# Patient Record
Sex: Male | Born: 1955 | Race: Black or African American | Hispanic: No | Marital: Single | Smoking: Current every day smoker
Health system: Southern US, Community
[De-identification: ages and names within clinical notes are randomized; demographics above are authoritative.]

## PROBLEM LIST (undated history)

## (undated) DIAGNOSIS — K759 Inflammatory liver disease, unspecified: Secondary | ICD-10-CM

## (undated) DIAGNOSIS — I10 Essential (primary) hypertension: Secondary | ICD-10-CM

---

## 2017-05-15 ENCOUNTER — Emergency Department
Admission: EM | Admit: 2017-05-15 | Discharge: 2017-05-15 | Disposition: A | Discharge status: Home / Self Care | Payer: Medicare Other | Attending: Emergency Medicine | Admitting: Emergency Medicine

## 2017-05-15 DIAGNOSIS — Z79899 Other long term (current) drug therapy: Secondary | ICD-10-CM | POA: Insufficient documentation

## 2017-05-15 DIAGNOSIS — R454 Irritability and anger: Secondary | ICD-10-CM | POA: Insufficient documentation

## 2017-05-15 LAB — COMPREHENSIVE METABOLIC PANEL
ALT: 22 U/L (ref 17–63)
ANION GAP: 10 (ref 5–15)
AST: 23 U/L (ref 15–41)
Albumin: 3.7 g/dL (ref 3.5–5.0)
Alkaline Phosphatase: 80 U/L (ref 38–126)
BILIRUBIN TOTAL: 0.6 mg/dL (ref 0.3–1.2)
BUN: 13 mg/dL (ref 6–20)
CHLORIDE: 104 mmol/L (ref 101–111)
CO2: 25 mmol/L (ref 22–32)
Calcium: 9.7 mg/dL (ref 8.9–10.3)
Creatinine, Ser: 0.92 mg/dL (ref 0.61–1.24)
Glucose, Bld: 107 mg/dL — ABNORMAL HIGH (ref 65–99)
POTASSIUM: 4 mmol/L (ref 3.5–5.1)
Sodium: 139 mmol/L (ref 135–145)
TOTAL PROTEIN: 7.7 g/dL (ref 6.5–8.1)

## 2017-05-15 LAB — URINE DRUG SCREEN, QUALITATIVE (ARMC ONLY)
AMPHETAMINES, UR SCREEN: NOT DETECTED
BARBITURATES, UR SCREEN: NOT DETECTED
Benzodiazepine, Ur Scrn: NOT DETECTED
COCAINE METABOLITE, UR ~~LOC~~: NOT DETECTED
Cannabinoid 50 Ng, Ur ~~LOC~~: NOT DETECTED
MDMA (Ecstasy)Ur Screen: NOT DETECTED
METHADONE SCREEN, URINE: NOT DETECTED
OPIATE, UR SCREEN: NOT DETECTED
Phencyclidine (PCP) Ur S: NOT DETECTED
Tricyclic, Ur Screen: NOT DETECTED

## 2017-05-15 LAB — CBC
HEMATOCRIT: 39.4 % — AB (ref 40.0–52.0)
HEMOGLOBIN: 13.1 g/dL (ref 13.0–18.0)
MCH: 29.4 pg (ref 26.0–34.0)
MCHC: 33.2 g/dL (ref 32.0–36.0)
MCV: 88.5 fL (ref 80.0–100.0)
Platelets: 434 10*3/uL (ref 150–440)
RBC: 4.45 MIL/uL (ref 4.40–5.90)
RDW: 14.1 % (ref 11.5–14.5)
WBC: 10.3 10*3/uL (ref 3.8–10.6)

## 2017-05-15 LAB — ETHANOL

## 2017-05-15 MED ORDER — LORAZEPAM 1 MG PO TABS
1.0000 mg | ORAL_TABLET | Freq: Once | ORAL | Status: AC
Start: 2017-05-15 — End: 2017-05-15
  Administered 2017-05-15: 1 mg via ORAL
  Filled 2017-05-15: qty 1

## 2017-05-15 NOTE — ED Notes (Signed)
SOC  CALLED  INFORMED  BILL  RN

## 2017-05-15 NOTE — Discharge Instructions (Signed)
Today the psychiatrist saw you and recommended you go home.  Please follow up with your psychiatrist as needed.  It was a pleasure to take care of you today, and thank you for coming to our emergency department.  If you have any questions or concerns before leaving please ask the nurse to grab me and I'm more than happy to go through your aftercare instructions again.  If you were prescribed any opioid pain medication today such as Norco, Vicodin, Percocet, morphine, hydrocodone, or oxycodone please make sure you do not drive when you are taking this medication as it can alter your ability to drive safely.  If you have any concerns once you are home that you are not improving or are in fact getting worse before you can make it to your follow-up appointment, please do not hesitate to call 911 and come back for further evaluation.  Merrily BrittleNeil Moorea Boissonneault, MD  Results for orders placed or performed during the hospital encounter of 05/15/17  Comprehensive metabolic panel  Result Value Ref Range   Sodium 139 135 - 145 mmol/L   Potassium 4.0 3.5 - 5.1 mmol/L   Chloride 104 101 - 111 mmol/L   CO2 25 22 - 32 mmol/L   Glucose, Bld 107 (H) 65 - 99 mg/dL   BUN 13 6 - 20 mg/dL   Creatinine, Ser 1.610.92 0.61 - 1.24 mg/dL   Calcium 9.7 8.9 - 09.610.3 mg/dL   Total Protein 7.7 6.5 - 8.1 g/dL   Albumin 3.7 3.5 - 5.0 g/dL   AST 23 15 - 41 U/L   ALT 22 17 - 63 U/L   Alkaline Phosphatase 80 38 - 126 U/L   Total Bilirubin 0.6 0.3 - 1.2 mg/dL   GFR calc non Af Amer >60 >60 mL/min   GFR calc Af Amer >60 >60 mL/min   Anion gap 10 5 - 15  Ethanol  Result Value Ref Range   Alcohol, Ethyl (B) <10 <10 mg/dL  cbc  Result Value Ref Range   WBC 10.3 3.8 - 10.6 K/uL   RBC 4.45 4.40 - 5.90 MIL/uL   Hemoglobin 13.1 13.0 - 18.0 g/dL   HCT 04.539.4 (L) 40.940.0 - 81.152.0 %   MCV 88.5 80.0 - 100.0 fL   MCH 29.4 26.0 - 34.0 pg   MCHC 33.2 32.0 - 36.0 g/dL   RDW 91.414.1 78.211.5 - 95.614.5 %   Platelets 434 150 - 440 K/uL  Urine Drug Screen,  Qualitative  Result Value Ref Range   Tricyclic, Ur Screen NONE DETECTED NONE DETECTED   Amphetamines, Ur Screen NONE DETECTED NONE DETECTED   MDMA (Ecstasy)Ur Screen NONE DETECTED NONE DETECTED   Cocaine Metabolite,Ur [place] NONE DETECTED NONE DETECTED   Opiate, Ur Screen NONE DETECTED NONE DETECTED   Phencyclidine (PCP) Ur S NONE DETECTED NONE DETECTED   Cannabinoid 50 Ng, Ur [place] NONE DETECTED NONE DETECTED   Barbiturates, Ur Screen NONE DETECTED NONE DETECTED   Benzodiazepine, Ur Scrn NONE DETECTED NONE DETECTED   Methadone Scn, Ur NONE DETECTED NONE DETECTED

## 2017-05-15 NOTE — ED Notes (Signed)
SOC report given to Dr Maricela BoSprague att
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
## 2017-08-11 ENCOUNTER — Ambulatory Visit: Payer: Medicare Other | Admitting: Gastroenterology

## 2017-08-18 ENCOUNTER — Encounter (INDEPENDENT_AMBULATORY_CARE_PROVIDER_SITE_OTHER): Payer: Self-pay

## 2017-08-18 ENCOUNTER — Encounter: Payer: Self-pay | Admitting: Gastroenterology

## 2017-08-18 ENCOUNTER — Other Ambulatory Visit: Payer: Self-pay

## 2017-08-18 ENCOUNTER — Ambulatory Visit (INDEPENDENT_AMBULATORY_CARE_PROVIDER_SITE_OTHER): Payer: Medicare Other | Admitting: Gastroenterology

## 2017-08-18 VITALS — BP 138/90 | HR 67 | Temp 98.1°F | Ht 73.0 in | Wt 201.8 lb

## 2017-08-18 DIAGNOSIS — K5904 Chronic idiopathic constipation: Secondary | ICD-10-CM | POA: Diagnosis not present

## 2017-08-18 DIAGNOSIS — Z1211 Encounter for screening for malignant neoplasm of colon: Secondary | ICD-10-CM | POA: Diagnosis not present

## 2017-08-18 MED ORDER — POLYETHYLENE GLYCOL 3350 17 GM/SCOOP PO POWD: 75.0000 g | Freq: Two times a day (BID) | ORAL | 0 refills | Status: AC

## 2017-08-18 NOTE — Progress Notes (Signed)
Arlyss Repress, MD [address]  [address]  [place], [place] [address]  Main: [phone]  Fax: [phone]    Gastroenterology Consultation  Referring Provider:   Behavioral medicine department  Primary Care Physician:  Patient, No Pcp Per Primary Gastroenterologist:  Dr. Arlyss Repress Reason for Consultation:     Colon cancer screening        HPI:   Henry Black is a 62 y.o. black male referred to discuss about screening colonoscopy by [facility] department. He lives in a group home, accompanied by a caregiver today. Patient reports having constipation, does not take stool softener. He reports having had a colonoscopy several years ago and does not recall the findings. He otherwise denies abdominal pain, rectal bleeding, nausea, vomiting. He does smoke cigarettes. His CBC and CMP are unremarkable.  NSAIDs: None  Antiplts/Anticoagulants/Anti thrombotics: None  GI Procedures: Colonoscopy several years ago, report not available He denies any GI surgeries He denies family history of GI malignancy  No past medical history on file.    Prior to Admission medications   Medication Sig Start Date End Date Taking? Authorizing Provider  atorvastatin (LIPITOR) 20 MG tablet Take 20 mg by mouth daily.   Yes [provider]  benztropine (COGENTIN) 2 MG tablet Take 2 mg by mouth 2 (two) times daily.   Yes [provider]  Cholecalciferol 2000 units CAPS Take 2,000 Units by mouth daily.   Yes [provider]  clonazePAM (KLONOPIN) 1 MG tablet Take 1 mg by mouth 2 (two) times daily.   Yes [provider]  haloperidol decanoate (HALDOL DECANOATE) 100 MG/ML injection Inject 150 mg into the muscle every 28 (twenty-eight) days.   Yes [provider]    No family history on file.   Social History   Tobacco Use  . Smoking status: Current Every Day Smoker    Packs/day: 0.50    Years: 46.00    Pack years: 23.00    Types:  Cigarettes  . Smokeless tobacco: Never Used  Substance Use Topics  . Alcohol use: No    Frequency: Never  . Drug use: No    Allergies as of 08/18/2017 - Review Complete 08/18/2017  Allergen Reaction Noted  . Penicillins Rash 05/15/2017    Review of Systems:    All systems reviewed and negative except where noted in HPI.   Physical Exam:  BP 138/90   Pulse 67   Temp 98.1 F (36.7 C) (Oral)   Ht 6\' 1"  (1.854 m)   Wt 201 lb 12.8 oz (91.5 kg)   BMI 26.62 kg/m  No LMP for male patient.  General:   Alert,  Well-developed, well-nourished, pleasant and cooperative in NAD Head:  Normocephalic and atraumatic. Eyes:  Sclera clear, no icterus.   Conjunctiva pink. Ears:  Normal auditory acuity. Nose:  No deformity, discharge, or lesions. Mouth:  No deformity or lesions,oropharynx pink & moist. Neck:  Supple; no masses or thyromegaly. Lungs:  Respirations even and unlabored.  Clear throughout to auscultation.   No wheezes, crackles, or rhonchi. No acute distress. Heart:  Regular rate and rhythm; no murmurs, clicks, rubs, or gallops. Abdomen:  Normal bowel sounds. Soft, non-tender and non-distended without masses, hepatosplenomegaly or hernias noted.  No guarding or rebound tenderness.   Rectal: Not performed Msk:  Symmetrical without gross deformities. Good, equal movement & strength bilaterally. Pulses:  Normal pulses noted. Extremities:  No clubbing or edema.  No cyanosis. Neurologic:  Alert and oriented  x3;  grossly normal neurologically. Skin:  Intact without significant lesions or rashes. No jaundice. Lymph Nodes:  No significant cervical adenopathy. Psych:  Alert and cooperative. Normal mood and affect.  Imaging Studies: None  Assessment and Plan:   Henry Black is a 62 y.o. black male with chronic constipation, referred here in consultation for screening colonoscopy.  - Start MiraLAX 17 g twice a day - High-fiber diet - Schedule colonoscopy for colon cancer  screening  I have discussed alternative options, risks & benefits,  which include, but are not limited to, bleeding, infection, perforation,respiratory complication & drug reaction.  The patient agrees with this plan & written consent will be obtained.     Follow up based on the colonoscopy findings  Arlyss Repressohini R Tashina Credit, MD

## 2017-08-20 ENCOUNTER — Ambulatory Visit: Admission: RE | Admit: 2017-08-20 | Payer: Medicare Other | Source: Ambulatory Visit | Admitting: Gastroenterology

## 2017-08-20 ENCOUNTER — Encounter: Admission: RE | Payer: Self-pay | Source: Ambulatory Visit

## 2017-08-20 SURGERY — COLONOSCOPY WITH PROPOFOL
Anesthesia: General

## 2018-01-15 ENCOUNTER — Other Ambulatory Visit: Payer: Self-pay

## 2018-01-15 DIAGNOSIS — Z1211 Encounter for screening for malignant neoplasm of colon: Secondary | ICD-10-CM

## 2018-02-02 ENCOUNTER — Encounter: Payer: Self-pay | Admitting: Anesthesiology

## 2018-02-02 ENCOUNTER — Encounter
Admission: RE | Disposition: A | Discharge status: Home / Self Care | Payer: Self-pay | Source: Ambulatory Visit | Attending: Gastroenterology

## 2018-02-02 ENCOUNTER — Ambulatory Visit: Payer: Medicare Other | Admitting: Anesthesiology

## 2018-02-02 ENCOUNTER — Ambulatory Visit
Admission: RE | Admit: 2018-02-02 | Discharge: 2018-02-02 | Disposition: A | Discharge status: Home / Self Care | Payer: Medicare Other | Source: Ambulatory Visit | Attending: Gastroenterology | Admitting: Gastroenterology

## 2018-02-02 DIAGNOSIS — F17210 Nicotine dependence, cigarettes, uncomplicated: Secondary | ICD-10-CM | POA: Insufficient documentation

## 2018-02-02 DIAGNOSIS — Z79899 Other long term (current) drug therapy: Secondary | ICD-10-CM | POA: Insufficient documentation

## 2018-02-02 DIAGNOSIS — Z880 Allergy status to penicillin: Secondary | ICD-10-CM | POA: Insufficient documentation

## 2018-02-02 DIAGNOSIS — Z1211 Encounter for screening for malignant neoplasm of colon: Secondary | ICD-10-CM | POA: Insufficient documentation

## 2018-02-02 SURGERY — COLONOSCOPY WITH PROPOFOL
Anesthesia: General

## 2018-02-02 MED ORDER — LIDOCAINE HCL (PF) 2 % IJ SOLN
INTRAMUSCULAR | Status: AC
  Filled 2018-02-02: qty 10

## 2018-02-02 MED ORDER — PROPOFOL 500 MG/50ML IV EMUL
INTRAVENOUS | Status: AC
  Filled 2018-02-02: qty 50

## 2018-02-02 MED ORDER — PROPOFOL 500 MG/50ML IV EMUL
INTRAVENOUS | Status: DC | PRN
  Administered 2018-02-02: 120 ug/kg/min via INTRAVENOUS

## 2018-02-02 MED ORDER — PROPOFOL 10 MG/ML IV BOLUS
INTRAVENOUS | Status: AC
  Filled 2018-02-02: qty 20

## 2018-02-02 MED ORDER — SODIUM CHLORIDE 0.9 % IV SOLN
INTRAVENOUS | Status: DC
  Administered 2018-02-02 (×2): via INTRAVENOUS

## 2018-02-02 MED ORDER — PROPOFOL 10 MG/ML IV BOLUS
INTRAVENOUS | Status: DC | PRN
  Administered 2018-02-02: 50 mg via INTRAVENOUS

## 2018-02-02 MED ORDER — LIDOCAINE HCL (CARDIAC) PF 100 MG/5ML IV SOSY
PREFILLED_SYRINGE | INTRAVENOUS | Status: DC | PRN
  Administered 2018-02-02: 60 mg via INTRAVENOUS

## 2018-02-02 NOTE — Op Note (Signed)
[facility] Gastroenterology Patient Name: Henry Black Procedure Date: 02/02/2018 3:28 PM MRN: [id number] Account #: [id number] Date of Birth: 11/03/1955 Admit Type: Outpatient Age: 62 Room: [facility] ENDO ROOM 4 Gender: Male Note Status: Finalized Procedure:            Colonoscopy Indications:          Screening for colorectal malignant neoplasm, This is                        the patient's first colonoscopy Providers:            Lin Landsman MD, MD Referring MD:         No Local Md, MD (Referring MD) Medicines:            Monitored Anesthesia Care Complications:        No immediate complications. Estimated blood loss: None. Procedure:            Pre-Anesthesia Assessment:                       - Prior to the procedure, a History and Physical was                        performed, and patient medications and allergies were                        reviewed. The patient is competent. The risks and                        benefits of the procedure and the sedation options and                        risks were discussed with the patient. All questions                        were answered and informed consent was obtained.                        Patient identification and proposed procedure were                        verified by the physician, the nurse, the                        anesthesiologist, the anesthetist and the technician in                        the pre-procedure area in the procedure room in the                        endoscopy suite. Mental Status Examination: alert and                        oriented. Airway Examination: normal oropharyngeal                        airway and neck mobility. Respiratory Examination:                        clear to auscultation. CV Examination: normal.  Prophylactic Antibiotics: The patient does not require                        prophylactic antibiotics. Prior Anticoagulants: The                         patient has taken no previous anticoagulant or                        antiplatelet agents. ASA Grade Assessment: II - A                        patient with mild systemic disease. After reviewing the                        risks and benefits, the patient was deemed in                        satisfactory condition to undergo the procedure. The                        anesthesia plan was to use monitored anesthesia care                        (MAC). Immediately prior to administration of                        medications, the patient was re-assessed for adequacy                        to receive sedatives. The heart rate, respiratory rate,                        oxygen saturations, blood pressure, adequacy of                        pulmonary ventilation, and response to care were                        monitored throughout the procedure. The physical status                        of the patient was re-assessed after the procedure.                       After obtaining informed consent, the colonoscope was                        passed under direct vision. Throughout the procedure,                        the patient's blood pressure, pulse, and oxygen                        saturations were monitored continuously. The                        Colonoscope was introduced through the anus and  advanced to the the cecum, identified by appendiceal                        orifice and ileocecal valve. The colonoscopy was                        performed with moderate difficulty due to significant                        looping and a tortuous colon. Successful completion of                        the procedure was aided by applying abdominal pressure.                        The patient tolerated the procedure well. The quality                        of the bowel preparation was evaluated using the BBPS                        [facility] Bowel Preparation Scale) with scores of: Right                         Colon = 3, Transverse Colon = 3 and Left Colon = 3                        (entire mucosa seen well with no residual staining,                        small fragments of stool or opaque liquid). The total                        BBPS score equals 9. Findings:      The perianal and digital rectal examinations were normal. Pertinent       negatives include normal sphincter tone and no palpable rectal lesions.      The colon (entire examined portion) appeared normal.      The retroflexed view of the distal rectum and anal verge was normal and       showed no anal or rectal abnormalities. Impression:           - The entire examined colon is normal.                       - The distal rectum and anal verge are normal on                        retroflexion view.                       - No specimens collected. Recommendation:       - Discharge patient to home (with escort).                       - Resume previous diet today.                       - Continue present medications.                       -  Repeat colonoscopy in 10 years for surveillance. Procedure Code(s):    --- Professional ---                       H8718, Colorectal cancer screening; colonoscopy on                        individual not meeting criteria for high risk Diagnosis Code(s):    --- Professional ---                       Z12.11, Encounter for screening for malignant neoplasm                        of colon CPT copyright 2017 American Medical Association. All rights reserved. The codes documented in this report are preliminary and upon coder review may  be revised to meet current compliance requirements. Dr. Ulyess Mort Lin Landsman MD, MD 02/02/2018 3:53:52 PM This report has been signed electronically. Number of Addenda: 0 Note Initiated On: 02/02/2018 3:28 PM Scope Withdrawal Time: 0 hours 6 minutes 16 seconds  Total Procedure Duration: 0 hours 16 minutes 20 seconds       [facility]

## 2018-02-02 NOTE — Transfer of Care (Signed)
Immediate Anesthesia Transfer of Care Note  Patient: Henry Black Near  Procedure(s) Performed: COLONOSCOPY WITH PROPOFOL (N/A )  Patient Location: PACU  Anesthesia Type:General  Level of Consciousness: sedated  Airway & Oxygen Therapy: Patient Spontanous Breathing and Patient connected to nasal cannula oxygen  Post-op Assessment: Report given to RN and Post -op Vital signs reviewed and stable  Post vital signs: Reviewed and stable  Last Vitals:  Vitals Value Taken Time  BP 93/58 02/02/2018  3:56 PM  Temp    Pulse 61 02/02/2018  3:56 PM  Resp 21 02/02/2018  3:56 PM  SpO2 100 % 02/02/2018  3:56 PM  Vitals shown include unvalidated device data.  Last Pain:  Vitals:   02/02/18 1347  TempSrc: Tympanic  PainSc: 0-No pain         Complications: No apparent anesthesia complications

## 2018-02-02 NOTE — H&P (Signed)
Arlyss Repress, MD [address]  [address]  [place], [place] [address]  Main: [phone]  Fax: [phone] Pager: [phone]  Primary Care Physician:  Patient, No Pcp Per Primary Gastroenterologist:  Dr. Arlyss Repress  Pre-Procedure History & Physical: HPI:  Henry Black is a 62 y.o. male is here for an colonoscopy.   History reviewed. No pertinent past medical history.  History reviewed. No pertinent surgical history.  Prior to Admission medications   Medication Sig Start Date End Date Taking? Authorizing Provider  atorvastatin (LIPITOR) 20 MG tablet Take 20 mg by mouth daily.   Yes [provider]  benztropine (COGENTIN) 2 MG tablet Take 2 mg by mouth 2 (two) times daily.   Yes [provider]  clonazePAM (KLONOPIN) 1 MG tablet Take 1 mg by mouth 2 (two) times daily.   Yes [provider]  haloperidol decanoate (HALDOL DECANOATE) 100 MG/ML injection Inject 150 mg into the muscle every 28 (twenty-eight) days.   Yes [provider]  Cholecalciferol 2000 units CAPS Take 2,000 Units by mouth daily.    [provider]    Allergies as of 01/15/2018 - Review Complete 08/18/2017  Allergen Reaction Noted  . Penicillins Rash 05/15/2017    History reviewed. No pertinent family history.  Social History   Socioeconomic History  . Marital status: Single    Spouse name: Not on file  . Number of children: Not on file  . Years of education: Not on file  . Highest education level: Not on file  Occupational History  . Not on file  Social Needs  . Financial resource strain: Not on file  . Food insecurity:    Worry: Not on file    Inability: Not on file  . Transportation needs:    Medical: Not on file    Non-medical: Not on file  Tobacco Use  . Smoking status: Current Every Day Smoker    Packs/day: 0.50    Years: 46.00    Pack years: 23.00    Types: Cigarettes  . Smokeless tobacco: Never Used  Substance and Sexual  Activity  . Alcohol use: No    Frequency: Never  . Drug use: No  . Sexual activity: Not on file  Lifestyle  . Physical activity:    Days per week: Not on file    Minutes per session: Not on file  . Stress: Not on file  Relationships  . Social connections:    Talks on phone: Not on file    Gets together: Not on file    Attends religious service: Not on file    Active member of club or organization: Not on file    Attends meetings of clubs or organizations: Not on file    Relationship status: Not on file  . Intimate partner violence:    Fear of current or ex partner: Not on file    Emotionally abused: Not on file    Physically abused: Not on file    Forced sexual activity: Not on file  Other Topics Concern  . Not on file  Social History Narrative  . Not on file    Review of Systems: See HPI, otherwise negative ROS  Physical Exam: BP (!) 137/95   Pulse 86   Temp 97.9 F (36.6 C) (Tympanic)   Resp 20   Ht 6\' 1"  (1.854 m)   Wt 192 lb (87.1 kg)   SpO2 100%   BMI 25.33 kg/m  General:   Alert,  pleasant and cooperative in NAD Head:  Normocephalic and atraumatic. Neck:  Supple; no masses or thyromegaly. Lungs:  Clear throughout to auscultation.    Heart:  Regular rate and rhythm. Abdomen:  Soft, nontender and nondistended. Normal bowel sounds, without guarding, and without rebound.   Neurologic:  Alert and  oriented x4;  grossly normal neurologically.  Impression/Plan: Baker JanusDonald Feild is here for an colonoscopy to be performed for colon cancer screening  Risks, benefits, limitations, and alternatives regarding  colonoscopy have been reviewed with the patient.  Questions have been answered.  All parties agreeable.   Lannette Donathohini Shagun Wordell, MD  02/02/2018, 3:20 PM

## 2018-02-02 NOTE — Anesthesia Preprocedure Evaluation (Signed)
Anesthesia Evaluation  Patient identified by MRN, date of birth, ID band Patient awake    Reviewed: Allergy & Precautions, NPO status , Patient's Chart, lab work & pertinent test results, reviewed documented beta blocker date and time   Airway Mallampati: II  TM Distance: >3 FB     Dental  (+) Upper Dentures, Lower Dentures   Pulmonary Current Smoker,           Cardiovascular      Neuro/Psych    GI/Hepatic   Endo/Other    Renal/GU      Musculoskeletal   Abdominal   Peds  Hematology   Anesthesia Other Findings   Reproductive/Obstetrics                             Anesthesia Physical Anesthesia Plan  ASA: II  Anesthesia Plan: General   Post-op Pain Management:    Induction: Intravenous  PONV Risk Score and Plan:   Airway Management Planned:   Additional Equipment:   Intra-op Plan:   Post-operative Plan:   Informed Consent: I have reviewed the patients History and Physical, chart, labs and discussed the procedure including the risks, benefits and alternatives for the proposed anesthesia with the patient or authorized representative who has indicated his/her understanding and acceptance.     Plan Discussed with: CRNA  Anesthesia Plan Comments:         Anesthesia Quick Evaluation  

## 2018-02-02 NOTE — Anesthesia Post-op Follow-up Note (Signed)
Anesthesia QCDR form completed.        

## 2018-02-02 NOTE — Anesthesia Postprocedure Evaluation (Signed)
Anesthesia Post Note  Patient: Henry Black  Procedure(s) Performed: COLONOSCOPY WITH PROPOFOL (N/A )  Patient location during evaluation: Endoscopy Anesthesia Type: General Level of consciousness: awake and alert Pain management: pain level controlled Vital Signs Assessment: post-procedure vital signs reviewed and stable Respiratory status: spontaneous breathing, nonlabored ventilation, respiratory function stable and patient connected to nasal cannula oxygen Cardiovascular status: blood pressure returned to baseline and stable Postop Assessment: no apparent nausea or vomiting Anesthetic complications: no     Last Vitals:  Vitals:   02/02/18 1347 02/02/18 1557  BP: (!) 137/95   Pulse: 86   Resp: 20   Temp: 36.6 C (!) 36.1 C  SpO2: 100%     Last Pain:  Vitals:   02/02/18 1557  TempSrc: Tympanic  PainSc:                  Sabeen Piechocki S

## 2018-06-02 ENCOUNTER — Encounter: Payer: Self-pay | Admitting: Emergency Medicine

## 2018-06-02 ENCOUNTER — Other Ambulatory Visit: Payer: Self-pay

## 2018-06-02 ENCOUNTER — Emergency Department
Admission: EM | Admit: 2018-06-02 | Discharge: 2018-06-02 | Disposition: A | Discharge status: Home / Self Care | Payer: Medicare Other | Attending: Emergency Medicine | Admitting: Emergency Medicine

## 2018-06-02 DIAGNOSIS — Z7689 Persons encountering health services in other specified circumstances: Secondary | ICD-10-CM

## 2018-06-02 DIAGNOSIS — Z79899 Other long term (current) drug therapy: Secondary | ICD-10-CM | POA: Insufficient documentation

## 2018-06-02 DIAGNOSIS — F17200 Nicotine dependence, unspecified, uncomplicated: Secondary | ICD-10-CM | POA: Diagnosis not present

## 2018-06-02 DIAGNOSIS — Z008 Encounter for other general examination: Secondary | ICD-10-CM | POA: Insufficient documentation

## 2018-06-02 MED ORDER — HALOPERIDOL DECANOATE 100 MG/ML IM SOLN
150.0000 mg | Freq: Once | INTRAMUSCULAR | Status: AC
  Administered 2018-06-02: 150 mg via INTRAMUSCULAR

## 2018-06-02 NOTE — ED Provider Notes (Signed)
[facility] Emergency Department Provider Note   ____________________________________________   First MD Initiated Contact with Patient 06/02/18 1425     (approximate)  I have reviewed the triage vital signs and the nursing notes.   HISTORY  Chief Complaint Haldol Injection    HPI Henry Black is a 62 y.o. male patient arrives with caretaker to have a Haldol injection.  Patient state injection normally given by [facility] clinic but they were unable to accommodate him today.  History reviewed. No pertinent past medical history.  Patient Active Problem List   Diagnosis Date Noted  . Colon cancer screening   . Chronic idiopathic constipation 08/18/2017    History reviewed. No pertinent surgical history.  Prior to Admission medications   Medication Sig Start Date End Date Taking? Authorizing Provider  atorvastatin (LIPITOR) 20 MG tablet Take 20 mg by mouth daily.    [provider]  benztropine (COGENTIN) 2 MG tablet Take 2 mg by mouth 2 (two) times daily.    [provider]  Cholecalciferol 2000 units CAPS Take 2,000 Units by mouth daily.    [provider]  clonazePAM (KLONOPIN) 1 MG tablet Take 1 mg by mouth 2 (two) times daily.    [provider]  haloperidol decanoate (HALDOL DECANOATE) 100 MG/ML injection Inject 150 mg into the muscle every 28 (twenty-eight) days.    [provider]    Allergies Penicillins  No family history on file.  Social History Social History   Tobacco Use  . Smoking status: Current Every Day Smoker    Packs/day: 0.50    Years: 46.00    Pack years: 23.00    Types: Cigarettes  . Smokeless tobacco: Never Used  Substance Use Topics  . Alcohol use: No    Frequency: Never  . Drug use: No    Review of Systems Constitutional: No fever/chills Eyes: No visual changes. ENT: No sore throat. Cardiovascular: Denies chest pain. Respiratory: Denies shortness of  breath. Skin: Negative for rash. Neurological: Negative for headaches, focal weakness or numbness. Psychiatric:Schizophrenia Allergic/Immunilogical: Penicillin ____________________________________________   PHYSICAL EXAM:  VITAL SIGNS: ED Triage Vitals  Enc Vitals Group     BP 06/02/18 1416 (!) 150/96     Pulse --      Resp 06/02/18 1416 18     Temp 06/02/18 1416 98.4 F (36.9 C)     Temp Source 06/02/18 1416 Oral     SpO2 06/02/18 1416 96 %     Weight 06/02/18 1417 195 lb (88.5 kg)     Height 06/02/18 1417 6\' 1"  (1.854 m)     Head Circumference --      Peak Flow --      Pain Score 06/02/18 1417 0     Pain Loc --      Pain Edu? --      Excl. in GC? --     Constitutional: Alert and oriented. Well appearing and in no acute distress. Cardiovascular: Normal rate, regular rhythm. Grossly normal heart sounds.  Good peripheral circulation. Respiratory: Normal respiratory effort.  No retractions. Lungs CTAB. Neurologic:  Normal speech and language. No gross focal neurologic deficits are appreciated. No gait instability. Skin:  Skin is warm, dry and intact. No rash noted. Psychiatric: Mood and affect are normal. Speech and behavior are normal.  ____________________________________________   LABS (all labs ordered are listed, but only abnormal results are displayed)  Labs Reviewed - No data to display ____________________________________________  EKG   ____________________________________________  RADIOLOGY  ED MD interpretation:    Official radiology report(s): No results found.  ____________________________________________   PROCEDURES  Procedure(s) performed: None  Procedures  Critical Care performed: No  ____________________________________________   INITIAL IMPRESSION / ASSESSMENT AND PLAN / ED COURSE  As part of my medical decision making, I reviewed the following data within the electronic MEDICAL RECORD NUMBER    Encounter for medication  administration.  Patient given IM injection 100 mg Haldol.  Patient advised follow-up PCP for continued care.      ____________________________________________   FINAL CLINICAL IMPRESSION(S) / ED DIAGNOSES  Final diagnoses:  Encounter for medication administration     ED Discharge Orders    None       Note:  This document was prepared using Dragon voice recognition software and may include unintentional dictation errors.    Joni Reining, PA-C 06/02/18 1452    Rockne Menghini, MD 06/02/18 [phone]

## 2018-06-02 NOTE — ED Triage Notes (Signed)
Pt arrived with caretaker due to inability to receive pt's haldol shot. Pt denies any pain or other complaints.

## 2018-09-16 ENCOUNTER — Emergency Department
Admission: EM | Admit: 2018-09-16 | Discharge: 2018-09-17 | Disposition: A | Discharge status: Home / Self Care | Payer: Medicare Other | Attending: Emergency Medicine | Admitting: Emergency Medicine

## 2018-09-16 ENCOUNTER — Other Ambulatory Visit: Payer: Self-pay

## 2018-09-16 ENCOUNTER — Encounter: Payer: Self-pay | Admitting: Emergency Medicine

## 2018-09-16 DIAGNOSIS — I10 Essential (primary) hypertension: Secondary | ICD-10-CM | POA: Diagnosis not present

## 2018-09-16 DIAGNOSIS — F17210 Nicotine dependence, cigarettes, uncomplicated: Secondary | ICD-10-CM | POA: Insufficient documentation

## 2018-09-16 DIAGNOSIS — Z79899 Other long term (current) drug therapy: Secondary | ICD-10-CM | POA: Insufficient documentation

## 2018-09-16 DIAGNOSIS — F329 Major depressive disorder, single episode, unspecified: Secondary | ICD-10-CM | POA: Insufficient documentation

## 2018-09-16 DIAGNOSIS — F99 Mental disorder, not otherwise specified: Secondary | ICD-10-CM | POA: Diagnosis present

## 2018-09-16 DIAGNOSIS — R4689 Other symptoms and signs involving appearance and behavior: Secondary | ICD-10-CM | POA: Diagnosis present

## 2018-09-16 DIAGNOSIS — R456 Violent behavior: Secondary | ICD-10-CM | POA: Insufficient documentation

## 2018-09-16 HISTORY — DX: Essential (primary) hypertension: I10

## 2018-09-16 LAB — CBC
HCT: 43.9 % (ref 39.0–52.0)
Hemoglobin: 14.4 g/dL (ref 13.0–17.0)
MCH: 29.4 pg (ref 26.0–34.0)
MCHC: 32.8 g/dL (ref 30.0–36.0)
MCV: 89.8 fL (ref 80.0–100.0)
NRBC: 0 % (ref 0.0–0.2)
Platelets: 219 10*3/uL (ref 150–400)
RBC: 4.89 MIL/uL (ref 4.22–5.81)
RDW: 13.2 % (ref 11.5–15.5)
WBC: 10.2 10*3/uL (ref 4.0–10.5)

## 2018-09-16 LAB — URINE DRUG SCREEN, QUALITATIVE (ARMC ONLY)
AMPHETAMINES, UR SCREEN: NOT DETECTED
BENZODIAZEPINE, UR SCRN: NOT DETECTED
Barbiturates, Ur Screen: NOT DETECTED
Cannabinoid 50 Ng, Ur ~~LOC~~: NOT DETECTED
Cocaine Metabolite,Ur ~~LOC~~: NOT DETECTED
MDMA (ECSTASY) UR SCREEN: NOT DETECTED
Methadone Scn, Ur: NOT DETECTED
Opiate, Ur Screen: NOT DETECTED
Phencyclidine (PCP) Ur S: NOT DETECTED
TRICYCLIC, UR SCREEN: NOT DETECTED

## 2018-09-16 LAB — COMPREHENSIVE METABOLIC PANEL
ALBUMIN: 4.5 g/dL (ref 3.5–5.0)
ALT: 39 U/L (ref 0–44)
ANION GAP: 6 (ref 5–15)
AST: 48 U/L — AB (ref 15–41)
Alkaline Phosphatase: 62 U/L (ref 38–126)
BILIRUBIN TOTAL: 0.9 mg/dL (ref 0.3–1.2)
BUN: 13 mg/dL (ref 8–23)
CHLORIDE: 104 mmol/L (ref 98–111)
CO2: 27 mmol/L (ref 22–32)
Calcium: 9 mg/dL (ref 8.9–10.3)
Creatinine, Ser: 1 mg/dL (ref 0.61–1.24)
GFR calc non Af Amer: >60 mL/min (ref >60)
Glucose, Bld: 89 mg/dL (ref 70–99)
POTASSIUM: 4.1 mmol/L (ref 3.5–5.1)
Sodium: 137 mmol/L (ref 135–145)
Total Protein: 7.6 g/dL (ref 6.5–8.1)

## 2018-09-16 LAB — ETHANOL: Alcohol, Ethyl (B): <10 mg/dL (ref <10)
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
## 2018-09-17 DIAGNOSIS — R4689 Other symptoms and signs involving appearance and behavior: Secondary | ICD-10-CM | POA: Diagnosis present

## 2018-09-17 LAB — ACETAMINOPHEN LEVEL

## 2018-09-17 LAB — SALICYLATE LEVEL: Salicylate Lvl: <7 mg/dL (ref 2.8–30.0)

## 2018-09-17 NOTE — Discharge Instructions (Signed)

## 2018-09-17 NOTE — Progress Notes (Signed)
LCSW called [facility] [phone] she has made arrangements for patient to be picked up by Ms Fonnie Mu and he will be residing at a new group home. LCSW consulted with ED RN Amy about this outcome.  No further SW needs   [employer] LCSW [phone]
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
## 2018-09-17 NOTE — ED Notes (Signed)
Pt dressed in burgundy scrubs by myself, Janeece Riggers, and [employer] officer that accompanied him.  Pts belongings, which include a coat, ball cap, sneakers, socks, sweatpants, underwear, sweatshirt, tshirt and a watch with a bright orange band (pt stated it did not work).  Items bagged, labelled, and placed at nurses station to be locked up.  Pt ambulated to room 23 without incident.

## 2018-09-17 NOTE — ED Notes (Signed)
Pt given breakfast tray

## 2018-09-17 NOTE — ED Notes (Signed)
Hourly rounding reveals patient in room. No complaints, stable, in no acute distress. Q15 minute rounds and monitoring via Rover and Officer to continue.   

## 2018-09-17 NOTE — ED Notes (Addendum)
Pt. Transferred to [facility] from ED to room after screening for contraband. Report to include Situation, Background, Assessment and Recommendations from Andrea RN. Pt. Oriented to unit including Q15 minute rounds as well as the security cameras for their protection. Patient is alert and oriented, warm and dry in no acute distress. Patient denies SI, HI, and AVH. Pt. Encouraged to let me know if needs arise.  

## 2018-09-17 NOTE — ED Notes (Signed)
Attempted to call patient group home and no answer.

## 2018-09-17 NOTE — ED Notes (Signed)
Pt discharged to group home in care of [facility].  VS stable. Pt denies SI/HI. Denies pain.  Discharge instructions reviewed with caregiver. Pt signed for discharge paperwork.  All belongings returned to patient.

## 2018-09-17 NOTE — ED Provider Notes (Signed)
[facility] Emergency Department Provider Note  ____________________________________________   First MD Initiated Contact with Patient 09/17/18 0007     (approximate)  I have reviewed the triage vital signs and the nursing notes.   HISTORY  Chief Complaint Medical Clearance   Level 5 caveat:  history/ROS may be limited by chronic mental illness.   HPI Baker JanusDonald Paragas is a 63 y.o. male who lives at a group home and has an unspecified mental illness and [place] [facility] [place] [employer] [place] psychiatric evaluation.  Reportedly he was buying lottery tickets at a gas station when the officers arrived because the group home called them and asked them to pick him up.  They said that his behavior has been different since he recently had a change in his Haldol.  Reportedly they are saying that they do not feel safe around him but there is been no indication that he has been acting violently.  The patient is calm and cooperative.  He says he does not understand why they called the police.  He says that he has no thoughts about harming anyone else or himself, he is not having any auditory or visual hallucinations, and he denies any medical issues.  He denies fever/chills, chest pain, shortness of breath, nausea, vomiting, and abdominal pain.  He is ambulating without any difficulty.  He is not able to specify his symptoms because he claims he does not have any symptoms.  Past Medical History:  Diagnosis Date  . Hypertension     Patient Active Problem List   Diagnosis Date Noted  . Aggressive behavior 09/17/2018  . Colon cancer screening   . Chronic idiopathic constipation 08/18/2017    History reviewed. No pertinent surgical history.  Prior to Admission medications   Medication Sig Start Date End Date Taking? Authorizing Provider  atorvastatin (LIPITOR) 20 MG tablet Take 20 mg by mouth daily.    [provider]  benztropine  (COGENTIN) 2 MG tablet Take 2 mg by mouth 2 (two) times daily.    [provider]  Cholecalciferol 2000 units CAPS Take 2,000 Units by mouth daily.    [provider]  clonazePAM (KLONOPIN) 1 MG tablet Take 1 mg by mouth 2 (two) times daily.    [provider]  haloperidol decanoate (HALDOL DECANOATE) 100 MG/ML injection Inject 150 mg into the muscle every 28 (twenty-eight) days.    [provider]    Allergies Penicillins  No family history on file.  Social History Social History   Tobacco Use  . Smoking status: Current Every Day Smoker    Packs/day: 0.50    Years: 46.00    Pack years: 23.00    Types: Cigarettes  . Smokeless tobacco: Never Used  Substance Use Topics  . Alcohol use: Not Currently    Frequency: Never  . Drug use: Not Currently    Review of Systems Level 5 caveat:  history/ROS may be limited by chronic mental illness.  Constitutional: No fever/chills Eyes: No visual changes. ENT: No sore throat. Cardiovascular: Denies chest pain. Respiratory: Denies shortness of breath. Gastrointestinal: No abdominal pain.  No nausea, no vomiting.  No diarrhea.  No constipation. Genitourinary: Negative for dysuria. Musculoskeletal: Negative for neck pain.  Negative for back pain. Integumentary: Negative for rash. Neurological: Negative for headaches, focal weakness or numbness. Psychiatric:Denies SI/HI.  Denies hallucinations. ____________________________________________   PHYSICAL EXAM:  VITAL SIGNS: ED Triage Vitals  Enc Vitals Group     BP 09/16/18  2310 (!) 165/91     Pulse Rate 09/16/18 2310 83     Resp 09/16/18 2310 18     Temp 09/16/18 2310 98.8 F (37.1 C)     Temp Source 09/16/18 2310 Oral     SpO2 09/16/18 2310 100 %     Weight 09/16/18 2311 92.5 kg (204 lb)     Height 09/16/18 2311 1.854 m (6\' 1" )     Head Circumference --      Peak Flow --      Pain Score 09/16/18 2310 0     Pain Loc --      Pain Edu? --       Excl. in GC? --     Constitutional: Alert and oriented. Well appearing and in no acute distress. Eyes: Conjunctivae are normal.  Head: Atraumatic. Nose: No congestion/rhinnorhea. Mouth/Throat: Mucous membranes are moist. Neck: No stridor.  No meningeal signs.   Cardiovascular: Normal rate, regular rhythm. Good peripheral circulation. Grossly normal heart sounds. Respiratory: Normal respiratory effort.  No retractions. Lungs CTAB. Gastrointestinal: Soft and nontender. No distention.  Musculoskeletal: No lower extremity tenderness nor edema. No gross deformities of extremities. Neurologic: Speech is a little bit difficult to understand at baseline but appears to be within normal limits.. No gross focal neurologic deficits are appreciated.  Skin:  Skin is warm, dry and intact. No rash noted. Psychiatric: Mood and affect are normal. Speech and behavior are normal.  ____________________________________________   LABS (all labs ordered are listed, but only abnormal results are displayed)  Labs Reviewed  COMPREHENSIVE METABOLIC PANEL - Abnormal; Notable for the following components:      Result Value   AST 48 (*)    All other components within normal limits  ACETAMINOPHEN LEVEL - Abnormal; Notable for the following components:   Acetaminophen (Tylenol), Serum <10 (*)    All other components within normal limits  ETHANOL  SALICYLATE LEVEL  CBC  URINE DRUG SCREEN, QUALITATIVE ([facility] ONLY)   ____________________________________________  EKG  None - EKG not ordered by ED physician ____________________________________________  RADIOLOGY   ED MD interpretation: No indication for imaging  Official radiology report(s): No results found.  ____________________________________________   PROCEDURES  Critical Care performed: No   Procedure(s) performed:   Procedures   ____________________________________________   INITIAL IMPRESSION / ASSESSMENT AND PLAN / ED COURSE  As  part of my medical decision making, I reviewed the following data within the electronic MEDICAL RECORD NUMBER Nursing notes reviewed and incorporated, Labs reviewed , Old chart reviewed and Notes from prior ED visits    Differential diagnosis includes, but is not limited to, mood disorder, chronic mental illness without any acute exacerbation, schizophrenia, metabolic or medication side effect.  The patient is well-appearing and in no distress and is perfectly calm and cooperative for [place].  He is showing no warning signs or symptoms.  Urine drug screen is negative, ethanol level is negative, all his lab work is within normal limits as are his vital signs except for some hypertension.  I have ordered a psychiatric consultation to try to facilitate him going back to the group home but at this point I do not see that he meets involuntary commitment criteria nor inpatient behavioral medicine treatment criteria.  Clinical Course as of Sep 18 455  Fri Sep 17, 2018  0455 I discussed the case in person with the psychiatric nurse practitioner.  She agrees that the patient does not require inpatient treatment and does not meet any criteria  for involuntary commitment.  Will discharge him back to the group home with no other recommendations.   [CF]    Clinical Course User Index [CF] Loleta Rose, MD    ____________________________________________  FINAL CLINICAL IMPRESSION(S) / ED DIAGNOSES  Final diagnoses:  Abnormal behavior     MEDICATIONS GIVEN DURING THIS VISIT:  Medications - No data to display   ED Discharge Orders    None       Note:  This document was prepared using Dragon voice recognition software and may include unintentional dictation errors.   Loleta Rose, MD 09/17/18 [phone]

## 2018-09-17 NOTE — ED Notes (Signed)
Pt dressing for discharge. Henry Black is here for patient. Maintained on 15 minute checks and observation by security camera for safety.

## 2019-02-21 ENCOUNTER — Other Ambulatory Visit: Payer: Self-pay

## 2019-02-21 ENCOUNTER — Ambulatory Visit (LOCAL_COMMUNITY_HEALTH_CENTER): Payer: Self-pay

## 2019-02-21 DIAGNOSIS — Z111 Encounter for screening for respiratory tuberculosis: Secondary | ICD-10-CM

## 2019-02-24 ENCOUNTER — Ambulatory Visit: Payer: Medicare Other

## 2019-02-24 ENCOUNTER — Other Ambulatory Visit: Payer: Self-pay

## 2019-02-24 DIAGNOSIS — Z111 Encounter for screening for respiratory tuberculosis: Secondary | ICD-10-CM

## 2019-02-24 LAB — TB SKIN TEST
Induration: 0 mm
TB Skin Test: NEGATIVE

## 2019-06-29 ENCOUNTER — Encounter: Payer: Self-pay | Admitting: Internal Medicine

## 2019-07-05 ENCOUNTER — Ambulatory Visit: Payer: Medicare Other | Admitting: Gastroenterology

## 2019-07-05 ENCOUNTER — Encounter: Payer: Self-pay | Admitting: Gastroenterology

## 2019-09-01 ENCOUNTER — Ambulatory Visit: Payer: Medicare Other | Admitting: Gastroenterology

## 2019-09-13 ENCOUNTER — Ambulatory Visit: Payer: Medicare Other | Admitting: Gastroenterology

## 2019-10-18 ENCOUNTER — Encounter (INDEPENDENT_AMBULATORY_CARE_PROVIDER_SITE_OTHER): Payer: Self-pay

## 2019-10-18 ENCOUNTER — Encounter: Payer: Self-pay | Admitting: Gastroenterology

## 2019-10-18 ENCOUNTER — Other Ambulatory Visit: Payer: Self-pay

## 2019-10-18 ENCOUNTER — Ambulatory Visit (INDEPENDENT_AMBULATORY_CARE_PROVIDER_SITE_OTHER): Payer: Medicare Other | Admitting: Gastroenterology

## 2019-10-18 VITALS — BP 163/103 | HR 79 | Temp 97.4°F | Ht 73.0 in | Wt 178.4 lb

## 2019-10-18 DIAGNOSIS — R7401 Elevation of levels of liver transaminase levels: Secondary | ICD-10-CM | POA: Diagnosis not present

## 2019-10-18 NOTE — Patient Instructions (Addendum)
Your Ultrasound is scheduled on 10/26/2019 [place] [facility]  Nothing to eat or drank after midnight

## 2019-10-18 NOTE — Progress Notes (Signed)
Arlyss Repress, MD [address]  [address]  [place], [place] [address]  Main: [phone]  Fax: [phone]    Gastroenterology Consultation  Referring Provider:   Behavioral medicine department  Primary Care Physician:  Sherrie Mustache, MD Primary Gastroenterologist:  Dr. Arlyss Repress Reason for Consultation: Elevated AST        HPI:   Henry Black is a 64 y.o. black male referred to discuss about screening colonoscopy by [facility] department. He lives in a group home, accompanied by a caregiver today. Patient reports having constipation, does not take stool softener. He reports having had a colonoscopy several years ago and does not recall the findings. He otherwise denies abdominal pain, rectal bleeding, nausea, vomiting. He does smoke cigarettes. His CBC and CMP are unremarkable.  Follow-up visit 10/15/2019 Patient is referred to me by Dr. Dario Guardian to evaluate for elevated AST.  He had mildly elevated AST 48 in 08/2018, repeat was 47 in 02/2019.  Therefore he is referred to GI for further evaluation.  Patient reports drinking 1 or 2 beers during weekends only.  He denies any symptoms of chronic liver disease.  He does not have any GI symptoms today.  He denies IV drug abuse, illicit drug use, blood transfusion, multiple sexual partners in the past.  Unknown hepatitis B and C status  NSAIDs: None  Antiplts/Anticoagulants/Anti thrombotics: None  GI Procedures: Colonoscopy 02/02/2018 - The entire examined colon is normal. - The distal rectum and anal verge are normal on retroflexion view. - No specimens collected.   He denies any GI surgeries He denies family history of GI malignancy  Past Medical History:  Diagnosis Date  . Hypertension     Current Outpatient Medications:  .  atorvastatin (LIPITOR) 20 MG tablet, Take 20 mg by mouth daily., Disp: , Rfl:  .  benztropine (COGENTIN) 2 MG tablet, Take 2 mg by mouth 2 (two) times daily., Disp: , Rfl:  .   Cholecalciferol 2000 units CAPS, Take 2,000 Units by mouth daily., Disp: , Rfl:  .  clonazePAM (KLONOPIN) 1 MG tablet, Take 1 mg by mouth 2 (two) times daily., Disp: , Rfl:  .  haloperidol decanoate (HALDOL DECANOATE) 100 MG/ML injection, Inject 150 mg into the muscle every 28 (twenty-eight) days., Disp: , Rfl:  .  losartan (COZAAR) 100 MG tablet, Take 100 mg by mouth daily., Disp: , Rfl:     History reviewed. No pertinent family history.   Social History   Tobacco Use  . Smoking status: Current Every Day Smoker    Packs/day: 0.50    Years: 46.00    Pack years: 23.00    Types: Cigarettes  . Smokeless tobacco: Never Used  Substance Use Topics  . Alcohol use: Yes    Alcohol/week: 1.0 standard drinks    Types: 1 Standard drinks or equivalent per week  . Drug use: Not Currently    Allergies as of 10/18/2019 - Review Complete 10/18/2019  Allergen Reaction Noted  . Penicillins Rash 05/15/2017    Review of Systems:    All systems reviewed and negative except where noted in HPI.   Physical Exam:  BP (!) 163/103 (BP Location: Left Arm, Patient Position: Sitting, Cuff Size: Normal)   Pulse 79   Temp (!) 97.4 F (36.3 C) (Oral)   Ht 6\' 1"  (1.854 m)   Wt 178 lb 6 oz (80.9 kg)   BMI 23.53 kg/m  No LMP for male patient.  General:   Alert,  Well-developed, well-nourished, pleasant and cooperative in NAD Head:  Normocephalic and atraumatic. Eyes:  Sclera clear, no icterus.   Conjunctiva pink. Ears:  Normal auditory acuity. Nose:  No deformity, discharge, or lesions. Mouth:  No deformity or lesions,oropharynx pink & moist. Neck:  Supple; no masses or thyromegaly. Lungs:  Respirations even and unlabored.  Clear throughout to auscultation.   No wheezes, crackles, or rhonchi. No acute distress. Heart:  Regular rate and rhythm; no murmurs, clicks, rubs, or gallops. Abdomen:  Normal bowel sounds. Soft, non-tender and non-distended without masses, hepatosplenomegaly or hernias noted.   No guarding or rebound tenderness.   Rectal: Not performed Msk:  Symmetrical without gross deformities. Good, equal movement & strength bilaterally. Pulses:  Normal pulses noted. Extremities:  No clubbing or edema.  No cyanosis. Neurologic:  Alert and oriented x3;  grossly normal neurologically. Skin:  Intact without significant lesions or rashes. No jaundice. Psych:  Alert and cooperative. Normal mood and affect.  Imaging Studies: None  Assessment and Plan:   Gracin Mcpartland is a 64 y.o. black male with isolated elevated AST  Recheck LFTs Check acute viral hepatitis panel Right upper quadrant ultrasound   Follow up in 3 months  Cephas Darby, MD

## 2019-10-19 ENCOUNTER — Telehealth: Payer: Self-pay

## 2019-10-19 DIAGNOSIS — R7401 Elevation of levels of liver transaminase levels: Secondary | ICD-10-CM

## 2019-10-19 LAB — HEPATITIS PANEL, ACUTE
Hep A IgM: NEGATIVE
Hep B C IgM: NEGATIVE
Hep C Virus Ab: >11 s/co ratio — ABNORMAL HIGH (ref 0.0–0.9)
Hepatitis B Surface Ag: NEGATIVE

## 2019-10-19 LAB — HEPATIC FUNCTION PANEL
ALT: 28 IU/L (ref 0–44)
AST: 24 IU/L (ref 0–40)
Albumin: 4.5 g/dL (ref 3.8–4.8)
Alkaline Phosphatase: 90 IU/L (ref 39–117)
Bilirubin Total: 0.4 mg/dL (ref 0.0–1.2)
Bilirubin, Direct: 0.12 mg/dL (ref 0.00–0.40)
Total Protein: 7 g/dL (ref 6.0–8.5)

## 2019-10-19 NOTE — Telephone Encounter (Signed)
Patient caregiver verbalized understanding and states she will take him to have this done at a [facility] facility

## 2019-10-19 NOTE — Telephone Encounter (Signed)
-----   Message from Toney Reil, MD sent at 10/19/2019 11:11 AM EDT ----- His HCV antibody is positive. Order HCV RNA with reflex genotype and let patient know  Thanks RV

## 2019-10-23 LAB — HCV RNA BY PCR, QN RFX GENO
HCV Quant Baseline: 127000 IU/mL
HCV log10: 5.104 log10 IU/mL

## 2019-10-23 LAB — HEPATITIS C GENOTYPE

## 2019-10-26 ENCOUNTER — Ambulatory Visit: Payer: Medicare Other

## 2019-11-01 ENCOUNTER — Telehealth: Payer: Self-pay

## 2019-11-01 NOTE — Telephone Encounter (Signed)
-----   Message from Rohini Reddy Vanga, MD sent at 10/24/2019 12:29 PM EDT ----- GingerHe should be treated for chronic active hep C.  He does not have cirrhosis.  Please order appropriate tests for initiation of hep C treatment.  Also order hepatitis B core antibody total, hep Aantibody total to check if he needs vaccination  ThanksRohini Vanga   

## 2019-11-01 NOTE — Telephone Encounter (Signed)
Left vm with group home representative to return my call.

## 2019-11-04 ENCOUNTER — Other Ambulatory Visit: Payer: Self-pay

## 2019-11-04 DIAGNOSIS — B182 Chronic viral hepatitis C: Secondary | ICD-10-CM

## 2019-11-04 NOTE — Telephone Encounter (Signed)
-----   Message from Toney Reil, MD sent at 10/24/2019 12:29 PM EDT ----- Henry Black should be treated for chronic active hep C.  He does not have cirrhosis.  Please order appropriate tests for initiation of hep C treatment.  Also order hepatitis B core antibody total, hep Aantibody total to check if he needs vaccination  [employer]

## 2019-11-04 NOTE — Telephone Encounter (Signed)
Pt's caretaker notified of results. Requested her to bring pt to our office next week to have additional labs done per Dr. Allegra Lai.

## 2019-11-09 ENCOUNTER — Ambulatory Visit: Admission: RE | Admit: 2019-11-09 | Payer: Medicare Other | Source: Ambulatory Visit

## 2019-11-09 ENCOUNTER — Other Ambulatory Visit
Admission: RE | Admit: 2019-11-09 | Discharge: 2019-11-09 | Disposition: A | Discharge status: Home / Self Care | Payer: Medicare Other | Source: Ambulatory Visit | Attending: Gastroenterology | Admitting: Gastroenterology

## 2019-11-09 DIAGNOSIS — B182 Chronic viral hepatitis C: Secondary | ICD-10-CM | POA: Diagnosis present

## 2019-11-09 LAB — HEPATITIS B CORE ANTIBODY, TOTAL: Hep B Core Total Ab: NONREACTIVE

## 2019-11-09 LAB — HEPATITIS A ANTIBODY, TOTAL: hep A Total Ab: NONREACTIVE

## 2019-11-11 LAB — HCV FIBROSURE
ALPHA 2-MACROGLOBULINS, QN: 337 mg/dL — ABNORMAL HIGH (ref 110–276)
ALT (SGPT) P5P: 45 IU/L (ref 0–55)
Apolipoprotein A-1: 143 mg/dL (ref 101–178)
Bilirubin, Total: 0.5 mg/dL (ref 0.0–1.2)
Fibrosis Score: 0.84 — ABNORMAL HIGH (ref 0.00–0.21)
GGT: 29 IU/L (ref 0–65)
Haptoglobin: <10 mg/dL — ABNORMAL LOW (ref 32–363)
Necroinflammat Activity Score: 0.44 — ABNORMAL HIGH (ref 0.00–0.17)

## 2019-11-11 NOTE — Progress Notes (Signed)
Fibrosure score suggest that he has cirrhosis.  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  Also, recommend EGD for variceal screening, check serum AFP levels, right upper quadrant ultrasound for HCC screening  I will see him for follow-up in 6/2021[facility]

## 2019-11-14 ENCOUNTER — Other Ambulatory Visit: Payer: Self-pay

## 2019-11-14 ENCOUNTER — Telehealth: Payer: Self-pay

## 2019-11-14 ENCOUNTER — Other Ambulatory Visit
Admission: RE | Admit: 2019-11-14 | Discharge: 2019-11-14 | Disposition: A | Discharge status: Home / Self Care | Payer: Medicare Other | Source: Ambulatory Visit | Attending: Gastroenterology | Admitting: Gastroenterology

## 2019-11-14 ENCOUNTER — Ambulatory Visit
Admission: RE | Admit: 2019-11-14 | Discharge: 2019-11-14 | Disposition: A | Discharge status: Home / Self Care | Payer: Medicare Other | Source: Ambulatory Visit | Attending: Gastroenterology | Admitting: Gastroenterology

## 2019-11-14 DIAGNOSIS — B182 Chronic viral hepatitis C: Secondary | ICD-10-CM | POA: Diagnosis not present

## 2019-11-14 DIAGNOSIS — R7401 Elevation of levels of liver transaminase levels: Secondary | ICD-10-CM | POA: Insufficient documentation

## 2019-11-14 DIAGNOSIS — K7460 Unspecified cirrhosis of liver: Secondary | ICD-10-CM | POA: Diagnosis not present

## 2019-11-14 DIAGNOSIS — I8500 Esophageal varices without bleeding: Secondary | ICD-10-CM

## 2019-11-14 NOTE — Telephone Encounter (Signed)
Patient has RUQ ultrasound today order the AFP labs.  Called patient caregiver and she confirmed he has ultrasound today and will get labs done today.  Patient caregiver scheduled EGD for 12/05/2019

## 2019-11-14 NOTE — Telephone Encounter (Signed)
-----   Message from Toney Reil, MD sent at 11/11/2019  1:02 PM EDT ----- Fibrosure score suggest that he has cirrhosis.  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  Also, recommend EGD for variceal screening, check serum AFP levels, right upper quadrant ultrasound for HCC screening  I will see him for follow-up in 6/2021[facility]

## 2019-11-15 LAB — AFP TUMOR MARKER: AFP, Serum, Tumor Marker: 6 ng/mL (ref 0.0–8.3)

## 2019-11-18 ENCOUNTER — Telehealth: Payer: Self-pay

## 2019-11-18 NOTE — Telephone Encounter (Signed)
Pt's care taker notified of results and referral for Hep C treatment.

## 2019-11-18 NOTE — Telephone Encounter (Signed)
-----   Message from Rohini Reddy Vanga, MD sent at 11/11/2019  1:02 PM EDT ----- Fibrosure score suggest that he has cirrhosis.  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  Also, recommend EGD for variceal screening, check serum AFP levels, right upper quadrant ultrasound for HCC screening  I will see him for follow-up in 6/2021Rohini Vanga  

## 2019-12-01 ENCOUNTER — Other Ambulatory Visit
Admission: RE | Admit: 2019-12-01 | Discharge: 2019-12-01 | Disposition: A | Discharge status: Home / Self Care | Payer: Medicare Other | Source: Ambulatory Visit | Attending: Gastroenterology | Admitting: Gastroenterology

## 2019-12-01 DIAGNOSIS — Z01812 Encounter for preprocedural laboratory examination: Secondary | ICD-10-CM | POA: Diagnosis present

## 2019-12-01 DIAGNOSIS — Z20822 Contact with and (suspected) exposure to covid-19: Secondary | ICD-10-CM | POA: Insufficient documentation

## 2019-12-01 LAB — SARS CORONAVIRUS 2 (TAT 6-24 HRS): SARS Coronavirus 2: NEGATIVE

## 2019-12-05 ENCOUNTER — Other Ambulatory Visit: Payer: Self-pay

## 2019-12-05 ENCOUNTER — Encounter: Payer: Self-pay | Admitting: Certified Registered"

## 2019-12-05 ENCOUNTER — Ambulatory Visit: Admission: RE | Admit: 2019-12-05 | Payer: Medicare Other | Source: Home / Self Care | Admitting: Gastroenterology

## 2019-12-05 ENCOUNTER — Encounter: Admission: RE | Payer: Self-pay | Source: Home / Self Care

## 2019-12-05 DIAGNOSIS — I8500 Esophageal varices without bleeding: Secondary | ICD-10-CM

## 2019-12-05 SURGERY — ESOPHAGOGASTRODUODENOSCOPY (EGD) WITH PROPOFOL
Anesthesia: General

## 2019-12-05 MED ORDER — PROPOFOL 500 MG/50ML IV EMUL
INTRAVENOUS | Status: AC
  Filled 2019-12-05: qty 100

## 2019-12-05 MED ORDER — LIDOCAINE HCL (PF) 2 % IJ SOLN
INTRAMUSCULAR | Status: AC
  Filled 2019-12-05: qty 5

## 2019-12-19 ENCOUNTER — Other Ambulatory Visit: Payer: Self-pay

## 2019-12-19 ENCOUNTER — Other Ambulatory Visit
Admission: RE | Admit: 2019-12-19 | Discharge: 2019-12-19 | Disposition: A | Discharge status: Home / Self Care | Payer: Medicare Other | Source: Ambulatory Visit | Attending: Gastroenterology | Admitting: Gastroenterology

## 2019-12-19 DIAGNOSIS — Z01812 Encounter for preprocedural laboratory examination: Secondary | ICD-10-CM | POA: Diagnosis present

## 2019-12-19 DIAGNOSIS — Z20822 Contact with and (suspected) exposure to covid-19: Secondary | ICD-10-CM | POA: Insufficient documentation

## 2019-12-19 LAB — SARS CORONAVIRUS 2 (TAT 6-24 HRS): SARS Coronavirus 2: NEGATIVE

## 2019-12-20 ENCOUNTER — Encounter: Payer: Self-pay | Admitting: Gastroenterology

## 2019-12-21 ENCOUNTER — Encounter: Payer: Self-pay | Admitting: Gastroenterology

## 2019-12-21 ENCOUNTER — Encounter
Admission: RE | Disposition: A | Discharge status: Home / Self Care | Payer: Self-pay | Source: Home / Self Care | Attending: Gastroenterology

## 2019-12-21 ENCOUNTER — Other Ambulatory Visit: Payer: Self-pay

## 2019-12-21 ENCOUNTER — Ambulatory Visit: Payer: Medicare Other | Admitting: Certified Registered"

## 2019-12-21 ENCOUNTER — Ambulatory Visit
Admission: RE | Admit: 2019-12-21 | Discharge: 2019-12-21 | Disposition: A | Discharge status: Home / Self Care | Payer: Medicare Other | Attending: Gastroenterology | Admitting: Gastroenterology

## 2019-12-21 DIAGNOSIS — K7469 Other cirrhosis of liver: Secondary | ICD-10-CM | POA: Diagnosis not present

## 2019-12-21 DIAGNOSIS — Z79899 Other long term (current) drug therapy: Secondary | ICD-10-CM | POA: Diagnosis not present

## 2019-12-21 DIAGNOSIS — I8500 Esophageal varices without bleeding: Secondary | ICD-10-CM

## 2019-12-21 DIAGNOSIS — F17210 Nicotine dependence, cigarettes, uncomplicated: Secondary | ICD-10-CM | POA: Diagnosis not present

## 2019-12-21 DIAGNOSIS — Z1389 Encounter for screening for other disorder: Secondary | ICD-10-CM | POA: Insufficient documentation

## 2019-12-21 DIAGNOSIS — I10 Essential (primary) hypertension: Secondary | ICD-10-CM | POA: Insufficient documentation

## 2019-12-21 DIAGNOSIS — K7460 Unspecified cirrhosis of liver: Secondary | ICD-10-CM | POA: Diagnosis not present

## 2019-12-21 HISTORY — DX: Inflammatory liver disease, unspecified: K75.9

## 2019-12-21 HISTORY — PX: ESOPHAGOGASTRODUODENOSCOPY (EGD) WITH PROPOFOL: SHX5813

## 2019-12-21 SURGERY — ESOPHAGOGASTRODUODENOSCOPY (EGD) WITH PROPOFOL
Anesthesia: General

## 2019-12-21 MED ORDER — GLYCOPYRROLATE 0.2 MG/ML IJ SOLN
INTRAMUSCULAR | Status: AC
  Filled 2019-12-21: qty 4

## 2019-12-21 MED ORDER — SODIUM CHLORIDE 0.9 % IV SOLN: INTRAVENOUS | Status: DC

## 2019-12-21 MED ORDER — BUTAMBEN-TETRACAINE-BENZOCAINE 2-2-14 % EX AERO
INHALATION_SPRAY | CUTANEOUS | Status: DC | PRN
  Administered 2019-12-21: 2 via TOPICAL

## 2019-12-21 MED ORDER — GLYCOPYRROLATE 0.2 MG/ML IJ SOLN
INTRAMUSCULAR | Status: DC | PRN
  Administered 2019-12-21: .1 mg via INTRAVENOUS

## 2019-12-21 MED ORDER — PROPOFOL 10 MG/ML IV BOLUS
INTRAVENOUS | Status: DC | PRN
  Administered 2019-12-21 (×3): 20 mg via INTRAVENOUS

## 2019-12-21 NOTE — Op Note (Signed)
[facility] Gastroenterology Patient Name: Henry Black Procedure Date: 12/21/2019 10:25 AM MRN: [id number] Account #: [id number] Date of Birth: May 30, 1956 Admit Type: Outpatient Age: 64 Room: [facility] ENDO ROOM 4 Gender: Male Note Status: Finalized Procedure:             Upper GI endoscopy Indications:           Cirrhosis rule out esophageal varices Providers:             Lin Landsman MD, MD Medicines:             Monitored Anesthesia Care Complications:         No immediate complications. Estimated blood loss: None. Procedure:             Pre-Anesthesia Assessment:                        - Prior to the procedure, a History and Physical was                         performed, and patient medications and allergies were                         reviewed. The patient is competent. The risks and                         benefits of the procedure and the sedation options and                         risks were discussed with the patient. All questions                         were answered and informed consent was obtained.                         Patient identification and proposed procedure were                         verified by the physician, the nurse, the                         anesthesiologist, the anesthetist and the technician                         in the pre-procedure area in the procedure room in the                         endoscopy suite. Mental Status Examination: alert and                         oriented. Airway Examination: normal oropharyngeal                         airway and neck mobility. Respiratory Examination:                         clear to auscultation. CV Examination: normal.                         Prophylactic Antibiotics: The patient  does not require                         prophylactic antibiotics. Prior Anticoagulants: The                         patient has taken no previous anticoagulant or                         antiplatelet  agents. ASA Grade Assessment: III - A                         patient with severe systemic disease. After reviewing                         the risks and benefits, the patient was deemed in                         satisfactory condition to undergo the procedure. The                         anesthesia plan was to use monitored anesthesia care                         (MAC). Immediately prior to administration of                         medications, the patient was re-assessed for adequacy                         to receive sedatives. The heart rate, respiratory                         rate, oxygen saturations, blood pressure, adequacy of                         pulmonary ventilation, and response to care were                         monitored throughout the procedure. The physical                         status of the patient was re-assessed after the                         procedure.                        After obtaining informed consent, the endoscope was                         passed under direct vision. Throughout the procedure,                         the patient's blood pressure, pulse, and oxygen                         saturations were monitored continuously. The Endoscope  was introduced through the mouth, and advanced to the                         second part of duodenum. The upper GI endoscopy was                         accomplished without difficulty. The patient tolerated                         the procedure well. Findings:      The duodenal bulb and second portion of the duodenum were normal.      The entire examined stomach was normal.      The cardia and gastric fundus were normal on retroflexion.      The gastroesophageal junction and examined esophagus were normal. Impression:            - Normal duodenal bulb and second portion of the                         duodenum.                        - Normal stomach.                        - Normal  gastroesophageal junction and esophagus.                        - No specimens collected. Recommendation:        - Discharge patient to home (with escort).                        - Low sodium diet today.                        - Continue present medications.                        - Return to my office as previously scheduled. Procedure Code(s):     --- Professional ---                        [phone], Esophagogastroduodenoscopy, flexible,                         transoral; diagnostic, including collection of                         specimen(s) by brushing or washing, when performed                         (separate procedure) Diagnosis Code(s):     --- Professional ---                        K74.60, Unspecified cirrhosis of liver CPT copyright 2019 American Medical Association. All rights reserved. The codes documented in this report are preliminary and upon coder review may  be revised to meet current compliance requirements. Dr. Ulyess Mort Lin Landsman MD, MD 12/21/2019 11:05:15 AM This report has been signed electronically. Number of Addenda: 0 Note Initiated On: 12/21/2019 10:25 AM Estimated  Blood Loss:  Estimated blood loss: none.      [facility]

## 2019-12-21 NOTE — Anesthesia Postprocedure Evaluation (Signed)
Anesthesia Post Note  Patient: Henry Black  Procedure(s) Performed: ESOPHAGOGASTRODUODENOSCOPY (EGD) WITH PROPOFOL (N/A )  Patient location during evaluation: Endoscopy Anesthesia Type: General Level of consciousness: awake and alert and oriented Pain management: pain level controlled Vital Signs Assessment: post-procedure vital signs reviewed and stable Respiratory status: spontaneous breathing, nonlabored ventilation and respiratory function stable Cardiovascular status: blood pressure returned to baseline and stable Postop Assessment: no signs of nausea or vomiting Anesthetic complications: no     Last Vitals:  Vitals:   12/21/19 1031 12/21/19 1108  BP: (!) 146/94 119/85  Pulse: (!) 57 (!) 58  Resp: 18 20  Temp: (!) 36.1 C (!) 36.1 C  SpO2: 99% 99%    Last Pain:  Vitals:   12/21/19 1108  TempSrc: Temporal  PainSc:                  Nishika Parkhurst

## 2019-12-21 NOTE — Transfer of Care (Signed)
Immediate Anesthesia Transfer of Care Note  Patient: Henry Black  Procedure(s) Performed: ESOPHAGOGASTRODUODENOSCOPY (EGD) WITH PROPOFOL (N/A )  Patient Location: PACU  Anesthesia Type:MAC  Level of Consciousness: awake  Airway & Oxygen Therapy: Patient Spontanous Breathing  Post-op Assessment: Report given to RN  Post vital signs: Reviewed  Last Vitals:  Vitals Value Taken Time  BP    Temp    Pulse 57 12/21/19 1109  Resp 18 12/21/19 1109  SpO2 100 % 12/21/19 1109  Vitals shown include unvalidated device data.  Last Pain:  Vitals:   12/21/19 1108  TempSrc: (P) Temporal  PainSc:          Complications: No apparent anesthesia complications

## 2019-12-21 NOTE — H&P (Signed)
Arlyss Repress, MD [address]  [address]  [place], [place] [address]  Main: [phone]  Fax: [phone] Pager: [phone]  Primary Care Physician:  Sherrie Mustache, MD Primary Gastroenterologist:  Dr. Arlyss Repress  Pre-Procedure History & Physical: HPI:  Henry Black is a 64 y.o. male is here for an endoscopy.   Past Medical History:  Diagnosis Date  . Hepatitis   . Hypertension     History reviewed. No pertinent surgical history.  Prior to Admission medications   Medication Sig Start Date End Date Taking? Authorizing Provider  atorvastatin (LIPITOR) 20 MG tablet Take 20 mg by mouth daily.   Yes [provider]  benztropine (COGENTIN) 2 MG tablet Take 2 mg by mouth 2 (two) times daily.   Yes [provider]  clonazePAM (KLONOPIN) 1 MG tablet Take 1 mg by mouth 2 (two) times daily.   Yes [provider]  losartan (COZAAR) 100 MG tablet Take 100 mg by mouth daily.   Yes [provider]  Cholecalciferol 2000 units CAPS Take 2,000 Units by mouth daily.    [provider]  haloperidol decanoate (HALDOL DECANOATE) 100 MG/ML injection Inject 150 mg into the muscle every 28 (twenty-eight) days.    [provider]    Allergies as of 12/05/2019 - Review Complete 12/02/2019  Allergen Reaction Noted  . Penicillins Rash 05/15/2017    History reviewed. No pertinent family history.  Social History   Socioeconomic History  . Marital status: Single    Spouse name: Not on file  . Number of children: Not on file  . Years of education: Not on file  . Highest education level: Not on file  Occupational History  . Not on file  Tobacco Use  . Smoking status: Current Every Day Smoker    Packs/day: 0.50    Years: 46.00    Pack years: 23.00    Types: Cigarettes  . Smokeless tobacco: Never Used  Substance and Sexual Activity  . Alcohol use: Yes    Alcohol/week: 1.0 standard drinks    Types: 1 Standard drinks or  equivalent per week  . Drug use: Not Currently  . Sexual activity: Not on file  Other Topics Concern  . Not on file  Social History Narrative  . Not on file   Social Determinants of Health   Financial Resource Strain:   . Difficulty of Paying Living Expenses:   Food Insecurity:   . Worried About Programme researcher, broadcasting/film/video in the Last Year:   . Barista in the Last Year:   Transportation Needs:   . Freight forwarder (Medical):   Marland Kitchen Lack of Transportation (Non-Medical):   Physical Activity:   . Days of Exercise per Week:   . Minutes of Exercise per Session:   Stress:   . Feeling of Stress :   Social Connections:   . Frequency of Communication with Friends and Family:   . Frequency of Social Gatherings with Friends and Family:   . Attends Religious Services:   . Active Member of Clubs or Organizations:   . Attends Banker Meetings:   Marland Kitchen Marital Status:   Intimate Partner Violence:   . Fear of Current or Ex-Partner:   . Emotionally Abused:   Marland Kitchen Physically Abused:   . Sexually Abused:     Review of Systems: See HPI, otherwise negative ROS  Physical Exam: BP (!) 146/94   Pulse (!) 57   Temp (!) 96.9  F (36.1 C) (Temporal)   Resp 18   Ht 6\' 1"  (1.854 m)   Wt 81.2 kg   SpO2 99%   BMI 23.62 kg/m  General:   Alert,  pleasant and cooperative in NAD Head:  Normocephalic and atraumatic. Neck:  Supple; no masses or thyromegaly. Lungs:  Clear throughout to auscultation.    Heart:  Regular rate and rhythm. Abdomen:  Soft, nontender and nondistended. Normal bowel sounds, without guarding, and without rebound.   Neurologic:  Alert and  oriented x4;  grossly normal neurologically.  Impression/Plan: Miko Sirico is here for an endoscopy to be performed for cirrhosis, variceal screening  Risks, benefits, limitations, and alternatives regarding  endoscopy have been reviewed with the patient.  Questions have been answered.  All parties agreeable.   Sherri Sear, MD  12/21/2019, 10:53 AM

## 2019-12-21 NOTE — Anesthesia Preprocedure Evaluation (Signed)
Anesthesia Evaluation  Patient identified by MRN, date of birth, ID band Patient awake    Reviewed: Allergy & Precautions, NPO status , Patient's Chart, lab work & pertinent test results  History of Anesthesia Complications Negative for: history of anesthetic complications  Airway Mallampati: II  TM Distance: >3 FB Neck ROM: Full    Dental  (+) Edentulous Upper, Edentulous Lower   Pulmonary neg sleep apnea, neg COPD, Current SmokerPatient did not abstain from smoking.,    breath sounds clear to auscultation- rhonchi (-) wheezing      Cardiovascular hypertension, Pt. on medications (-) CAD, (-) Past MI, (-) Cardiac Stents and (-) CABG  Rhythm:Regular Rate:Normal - Systolic murmurs and - Diastolic murmurs    Neuro/Psych neg Seizures negative neurological ROS  negative psych ROS   GI/Hepatic negative GI ROS, (+) Hepatitis -  Endo/Other  negative endocrine ROSneg diabetes  Renal/GU negative Renal ROS     Musculoskeletal negative musculoskeletal ROS (+)   Abdominal (+) - obese,   Peds  Hematology negative hematology ROS (+)   Anesthesia Other Findings Past Medical History: No date: Hepatitis No date: Hypertension   Reproductive/Obstetrics                             Anesthesia Physical Anesthesia Plan  ASA: II  Anesthesia Plan: General   Post-op Pain Management:    Induction: Intravenous  PONV Risk Score and Plan: 0 and Propofol infusion  Airway Management Planned: Natural Airway  Additional Equipment:   Intra-op Plan:   Post-operative Plan:   Informed Consent: I have reviewed the patients History and Physical, chart, labs and discussed the procedure including the risks, benefits and alternatives for the proposed anesthesia with the patient or authorized representative who has indicated his/her understanding and acceptance.     Dental advisory given  Plan Discussed with:  CRNA and Anesthesiologist  Anesthesia Plan Comments:         Anesthesia Quick Evaluation

## 2019-12-23 ENCOUNTER — Other Ambulatory Visit: Payer: Medicare Other

## 2019-12-23 ENCOUNTER — Encounter: Payer: Self-pay | Admitting: *Deleted

## 2019-12-30 ENCOUNTER — Other Ambulatory Visit: Payer: Medicare Other

## 2020-01-06 ENCOUNTER — Other Ambulatory Visit: Payer: Self-pay

## 2020-01-06 ENCOUNTER — Ambulatory Visit (LOCAL_COMMUNITY_HEALTH_CENTER): Payer: Medicare Other

## 2020-01-06 DIAGNOSIS — Z111 Encounter for screening for respiratory tuberculosis: Secondary | ICD-10-CM

## 2020-01-09 ENCOUNTER — Ambulatory Visit (LOCAL_COMMUNITY_HEALTH_CENTER): Payer: Medicare Other

## 2020-01-09 ENCOUNTER — Other Ambulatory Visit: Payer: Self-pay

## 2020-01-09 ENCOUNTER — Ambulatory Visit: Payer: Medicare Other | Admitting: Gastroenterology

## 2020-01-09 DIAGNOSIS — Z111 Encounter for screening for respiratory tuberculosis: Secondary | ICD-10-CM

## 2020-01-09 LAB — TB SKIN TEST
Induration: 0 mm
TB Skin Test: NEGATIVE

## 2020-01-20 ENCOUNTER — Ambulatory Visit (LOCAL_COMMUNITY_HEALTH_CENTER): Payer: Self-pay

## 2020-01-20 ENCOUNTER — Other Ambulatory Visit: Payer: Self-pay

## 2020-01-20 DIAGNOSIS — Z111 Encounter for screening for respiratory tuberculosis: Secondary | ICD-10-CM

## 2020-01-23 ENCOUNTER — Ambulatory Visit: Payer: Medicare Other | Admitting: Gastroenterology

## 2020-01-23 ENCOUNTER — Other Ambulatory Visit: Payer: Medicare Other

## 2020-01-23 ENCOUNTER — Ambulatory Visit (LOCAL_COMMUNITY_HEALTH_CENTER): Payer: Medicare Other

## 2020-01-23 ENCOUNTER — Other Ambulatory Visit: Payer: Self-pay

## 2020-01-23 DIAGNOSIS — Z111 Encounter for screening for respiratory tuberculosis: Secondary | ICD-10-CM

## 2020-01-23 LAB — TB SKIN TEST
Induration: 0 mm
TB Skin Test: NEGATIVE

## 2020-03-29 ENCOUNTER — Telehealth: Payer: Self-pay

## 2020-03-29 ENCOUNTER — Ambulatory Visit (INDEPENDENT_AMBULATORY_CARE_PROVIDER_SITE_OTHER): Payer: Medicare Other | Admitting: Gastroenterology

## 2020-03-29 ENCOUNTER — Other Ambulatory Visit: Payer: Self-pay

## 2020-03-29 ENCOUNTER — Encounter: Payer: Self-pay | Admitting: Gastroenterology

## 2020-03-29 VITALS — BP 125/81 | HR 80 | Temp 98.3°F | Wt 169.4 lb

## 2020-03-29 DIAGNOSIS — B182 Chronic viral hepatitis C: Secondary | ICD-10-CM | POA: Diagnosis not present

## 2020-03-29 DIAGNOSIS — K7469 Other cirrhosis of liver: Secondary | ICD-10-CM

## 2020-03-29 DIAGNOSIS — K7460 Unspecified cirrhosis of liver: Secondary | ICD-10-CM | POA: Insufficient documentation

## 2020-03-29 NOTE — Telephone Encounter (Signed)
Called [employer] and they said insurance did approve the medication but were unable to mail out to patient because patient would not returned call. They said a new referral would have to be done. The care giver said to call Damie at [phone] or Leta Jungling [phone] informed them that Ginger would not be back till Wednesday

## 2020-03-29 NOTE — Progress Notes (Signed)
Henry Repress, Henry Black [address]  [address]  [place], [place] [address]  Main: [phone]  Fax: [phone]    Gastroenterology Consultation  Referring Provider:   Behavioral medicine department  Primary Care Physician:  Sherrie Mustache, Henry Black Primary Gastroenterologist:  Dr. Arlyss Black Reason for Consultation: Elevated AST   HPI:   Oran Dillenburg is a 64 y.o. black male referred to discuss about screening colonoscopy by [facility] department. He lives in a group home, accompanied by a caregiver today. Patient reports having constipation, does not take stool softener. He reports having had a colonoscopy several years ago and does not recall the findings. He otherwise denies abdominal pain, rectal bleeding, nausea, vomiting. He does smoke cigarettes. His CBC and CMP are unremarkable.  Follow-up visit 10/15/2019 Patient is referred to me by Dr. Dario Guardian to evaluate for elevated AST.  He had mildly elevated AST 48 in 08/2018, repeat was 47 in 02/2019.  Therefore he is referred to GI for further evaluation.  Patient reports drinking 1 or 2 beers during weekends only.  He denies any symptoms of chronic liver disease.  He does not have any GI symptoms today.  He denies IV drug abuse, illicit drug use, blood transfusion, multiple sexual partners in the past.  Unknown hepatitis B and C status and she has been previously counseled  Follow-up visit 03/29/20 Patient has been approved for the treatment of chronic hep C.  However, he did not answer the call from [facility] for confirmation of medication delivery.  He denies any GI symptoms today. His hepatitis A and B serologies are negative.  He underwent upper endoscopy with no evidence of esophageal varices.  NSAIDs: None recent  Antiplts/Anticoagulants/Anti thrombotics: None  GI Procedures: Colonoscopy 02/02/2018 - The entire examined colon is normal. - The distal rectum and anal verge are normal on retroflexion view. - No  specimens collected.  Upper endoscopy 12/21/2019 - Normal duodenal bulb and second portion of the duodenum. - Normal stomach. - Normal gastroesophageal junction and esophagus. - No specimens collected.  He denies any GI surgeries He denies family history of GI malignancy  Past Medical History:  Diagnosis Date  . Hepatitis   . Hypertension     Current Outpatient Medications:  .  atorvastatin (LIPITOR) 20 MG tablet, Take 20 mg by mouth daily., Disp: , Rfl:  .  benztropine (COGENTIN) 2 MG tablet, Take 2 mg by mouth 2 (two) times daily., Disp: , Rfl:  .  Cholecalciferol 2000 units CAPS, Take 2,000 Units by mouth daily., Disp: , Rfl:  .  clonazePAM (KLONOPIN) 1 MG tablet, Take 1 mg by mouth 2 (two) times daily., Disp: , Rfl:  .  haloperidol decanoate (HALDOL DECANOATE) 100 MG/ML injection, Inject 150 mg into the muscle every 28 (twenty-eight) days., Disp: , Rfl:  .  losartan (COZAAR) 100 MG tablet, Take 100 mg by mouth daily., Disp: , Rfl:     History reviewed. No pertinent family history.   Social History   Tobacco Use  . Smoking status: Current Every Day Smoker    Packs/day: 0.50    Years: 46.00    Pack years: 23.00    Types: Cigarettes  . Smokeless tobacco: Never Used  Vaping Use  . Vaping Use: Never used  Substance Use Topics  . Alcohol use: Yes    Alcohol/week: 1.0 standard drink    Types: 1 Standard drinks or equivalent per week  . Drug use: Not Currently    Allergies  as of 03/29/2020 - Review Complete 03/29/2020  Allergen Reaction Noted  . Penicillins Rash 05/15/2017    Review of Systems:    All systems reviewed and negative except where noted in HPI.   Physical Exam:  BP 125/81 (BP Location: Left Arm, Patient Position: Sitting, Cuff Size: Normal)   Pulse 80   Temp 98.3 F (36.8 C) (Oral)   Wt 169 lb 6 oz (76.8 kg)   BMI 22.35 kg/m  No LMP for male patient.  General:   Alert,  Well-developed, well-nourished, pleasant and cooperative in NAD Head:   Normocephalic and atraumatic. Eyes:  Sclera clear, no icterus.   Conjunctiva pink. Ears:  Normal auditory acuity. Nose:  No deformity, discharge, or lesions. Mouth:  No deformity or lesions,oropharynx pink & moist. Neck:  Supple; no masses or thyromegaly. Lungs:  Respirations even and unlabored.  Clear throughout to auscultation.   No wheezes, crackles, or rhonchi. No acute distress. Heart:  Regular rate and rhythm; no murmurs, clicks, rubs, or gallops. Abdomen:  Normal bowel sounds. Soft, non-tender and non-distended without masses, hepatosplenomegaly or hernias noted.  No guarding or rebound tenderness.   Rectal: Not performed Msk:  Symmetrical without gross deformities. Good, equal movement & strength bilaterally. Pulses:  Normal pulses noted. Extremities:  No clubbing or edema.  No cyanosis. Neurologic:  Alert and oriented x3;  grossly normal neurologically. Skin:  Intact without significant lesions or rashes. No jaundice. Psych:  Alert and cooperative. Normal mood and affect.  Imaging Studies: None  Assessment and Plan:   Jaivion Kingsley is a 64 y.o. 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  Currently treatment nave.  Patient did not initiate the treatment yet.  We will reapply for the medication Recommend Twinrix vaccine We will repeat right upper quadrant ultrasound and AFP levels during next visit AFP was normal in 4/21  Follow up in 3 months  Henry Repress, Henry Black

## 2020-03-29 NOTE — Telephone Encounter (Signed)
Patient mom states patient never received Hepatitis C treatment. I see that [employer] approved medication.

## 2020-04-12 NOTE — Telephone Encounter (Signed)
Hepatitis C form refaxed to [facility] specialty pharmacy to reopen.

## 2020-04-24 ENCOUNTER — Other Ambulatory Visit: Payer: Self-pay

## 2020-04-24 MED ORDER — SOFOSBUVIR-VELPATASVIR 400-100 MG PO TABS: 1.0000 | ORAL_TABLET | Freq: Every day | ORAL | 2 refills | Status: DC

## 2020-07-19 ENCOUNTER — Ambulatory Visit: Payer: Medicare Other | Attending: Internal Medicine

## 2020-07-19 ENCOUNTER — Other Ambulatory Visit: Payer: Self-pay

## 2020-07-19 DIAGNOSIS — Z23 Encounter for immunization: Secondary | ICD-10-CM

## 2020-07-19 NOTE — Progress Notes (Signed)
   Covid-19 Vaccination Clinic  Name:  Inioluwa Baris    MRN: [id number] DOB: 1956/04/13  07/19/2020  Mr. Bail was observed post Covid-19 immunization for 15 minutes without incident. He was provided with Vaccine Information Sheet and instruction to access the V-Safe system.   Mr. Fearn was instructed to call 911 with any severe reactions post vaccine: Marland Kitchen Difficulty breathing  . Swelling of face and throat  . A fast heartbeat  . A bad rash all over body  . Dizziness and weakness   Immunizations Administered    Name Date Dose VIS Date Route   Pfizer COVID-19 Vaccine 07/19/2020 11:31 AM 0.3 mL 05/16/2020 Intramuscular   Manufacturer: [employer], [employer]   Lot: [id number]   NDC: [address]-7

## 2020-10-08 ENCOUNTER — Other Ambulatory Visit: Payer: Self-pay

## 2020-10-09 ENCOUNTER — Ambulatory Visit: Payer: Medicare Other | Admitting: Gastroenterology

## 2020-11-16 ENCOUNTER — Other Ambulatory Visit: Payer: Self-pay

## 2020-11-21 ENCOUNTER — Other Ambulatory Visit: Payer: Self-pay

## 2020-11-21 ENCOUNTER — Encounter: Payer: Self-pay | Admitting: Gastroenterology

## 2020-11-21 ENCOUNTER — Ambulatory Visit (INDEPENDENT_AMBULATORY_CARE_PROVIDER_SITE_OTHER): Payer: Medicare Other | Admitting: Gastroenterology

## 2020-11-21 VITALS — BP 162/103 | HR 98 | Temp 98.7°F | Ht 73.0 in | Wt 151.1 lb

## 2020-11-21 DIAGNOSIS — R634 Abnormal weight loss: Secondary | ICD-10-CM | POA: Diagnosis not present

## 2020-11-21 DIAGNOSIS — B182 Chronic viral hepatitis C: Secondary | ICD-10-CM

## 2020-11-21 DIAGNOSIS — K7469 Other cirrhosis of liver: Secondary | ICD-10-CM | POA: Diagnosis not present

## 2020-11-21 DIAGNOSIS — F1010 Alcohol abuse, uncomplicated: Secondary | ICD-10-CM | POA: Diagnosis not present

## 2020-11-21 DIAGNOSIS — B1920 Unspecified viral hepatitis C without hepatic coma: Secondary | ICD-10-CM

## 2020-11-21 NOTE — Progress Notes (Signed)
Arlyss Repress, MD [address]  [address]  [place], [place] [address]  Main: [phone]  Fax: [phone]    Gastroenterology Consultation  Referring Provider:   Behavioral medicine department  Primary Care Physician:  Sherrie Mustache, MD Primary Gastroenterologist:  Dr. Arlyss Repress Reason for Consultation: Unintentional weight loss, chronic hepatitis C   HPI:   Henry Black is a 65 y.o. black male referred to discuss about screening colonoscopy by [facility] department. He lives in a group home, accompanied by a caregiver today. Patient reports having constipation, does not take stool softener. He reports having had a colonoscopy several years ago and does not recall the findings. He otherwise denies abdominal pain, rectal bleeding, nausea, vomiting. He does smoke cigarettes. His CBC and CMP are unremarkable.  Follow-up visit 10/15/2019 Patient is referred to me by Dr. Dario Guardian to evaluate for elevated AST.  He had mildly elevated AST 48 in 08/2018, repeat was 47 in 02/2019.  Therefore he is referred to GI for further evaluation.  Patient reports drinking 1 or 2 beers during weekends only.  He denies any symptoms of chronic liver disease.  He does not have any GI symptoms today.  He denies IV drug abuse, illicit drug use, blood transfusion, multiple sexual partners in the past.  Unknown hepatitis B and C status and she has been previously counseled  Follow-up visit 03/29/20 Patient has been approved for the treatment of chronic hep C.  However, he did not answer the call from [facility] for confirmation of medication delivery.  He denies any GI symptoms today. His hepatitis A and B serologies are negative.  He underwent upper endoscopy with no evidence of esophageal varices.  Follow-up visit 11/21/2020 Patient is referred for unintentional weight loss.Patient lost about 16 pounds since 9/21 visit.  Patient presented to clinic today with his caregiver.  Patient is  altered, appeared intoxicated with alcohol and he admits to drinking alcohol yesterday.  He has been drinking alcohol regularly.  He finished hepatitis C treatment from October through December, 12 weeks of treatment.  He denies any abdominal pain, nausea or vomiting, swelling of legs, abdominal distention.  He does smoke cigarettes daily.  Patient denies any diarrhea or constipation.  He admits to smoking since age of 60, used to smoke 2 packs/day, currently smoking 1 pack/day  NSAIDs: None recent  Antiplts/Anticoagulants/Anti thrombotics: None  GI Procedures: Colonoscopy 02/02/2018 - The entire examined colon is normal. - The distal rectum and anal verge are normal on retroflexion view. - No specimens collected.  Upper endoscopy 12/21/2019 - Normal duodenal bulb and second portion of the duodenum. - Normal stomach. - Normal gastroesophageal junction and esophagus. - No specimens collected.  He denies any GI surgeries He denies family history of GI malignancy  Past Medical History:  Diagnosis Date  . Hepatitis   . Hypertension     Current Outpatient Medications:  .  atorvastatin (LIPITOR) 20 MG tablet, Take 20 mg by mouth daily., Disp: , Rfl:  .  benztropine (COGENTIN) 2 MG tablet, Take 2 mg by mouth 2 (two) times daily., Disp: , Rfl:  .  Cholecalciferol 2000 units CAPS, Take 2,000 Units by mouth daily., Disp: , Rfl:  .  clonazePAM (KLONOPIN) 1 MG tablet, Take 1 mg by mouth 2 (two) times daily., Disp: , Rfl:  .  haloperidol decanoate (HALDOL DECANOATE) 100 MG/ML injection, Inject 150 mg into the muscle every 28 (twenty-eight) days., Disp: , Rfl:  .  losartan (  COZAAR) 100 MG tablet, Take 100 mg by mouth daily., Disp: , Rfl:  .  LUCEMYRA 0.18 MG TABS, , Disp: , Rfl:  .  Sofosbuvir-Velpatasvir (EPCLUSA) 400-100 MG TABS, Take 1 tablet by mouth daily., Disp: 28 tablet, Rfl: 2    History reviewed. No pertinent family history.   Social History   Tobacco Use  . Smoking status:  Current Every Day Smoker    Packs/day: 0.50    Years: 46.00    Pack years: 23.00    Types: Cigarettes  . Smokeless tobacco: Never Used  Vaping Use  . Vaping Use: Never used  Substance Use Topics  . Alcohol use: Yes    Alcohol/week: 1.0 standard drink    Types: 1 Standard drinks or equivalent per week  . Drug use: Not Currently    Allergies as of 11/21/2020 - Review Complete 11/21/2020  Allergen Reaction Noted  . Penicillins Rash 05/15/2017    Review of Systems:    All systems reviewed and negative except where noted in HPI.   Physical Exam:  BP (!) 162/103 (BP Location: Left Arm, Patient Position: Sitting, Cuff Size: Normal)   Pulse 98   Temp 98.7 F (37.1 C) (Oral)   Ht 6\' 1"  (1.854 m)   Wt 151 lb 2 oz (68.5 kg)   BMI 19.94 kg/m  No LMP for male patient.  General: Lethargic, altered, thin built, poorly nourished Head:  Normocephalic and atraumatic, bitemporal wasting. Eyes:  Sclera clear, no icterus.   Conjunctiva pink. Ears:  Normal auditory acuity. Nose:  No deformity, discharge, or lesions. Mouth:  No deformity or lesions,oropharynx pink & moist. Neck:  Supple; no masses or thyromegaly. Lungs:  Respirations even and unlabored.  Clear throughout to auscultation.   No wheezes, crackles, or rhonchi. No acute distress. Heart:  Regular rate and rhythm; no murmurs, clicks, rubs, or gallops. Abdomen:  Normal bowel sounds. Soft, scaphoid, non-tender and non-distended without masses, hepatosplenomegaly or hernias noted.  No guarding or rebound tenderness.   Rectal: Not performed Msk:  Symmetrical without gross deformities. Good, equal movement & strength bilaterally. Pulses:  Normal pulses noted. Extremities:  No clubbing or edema.  No cyanosis. Neurologic:  Alert and oriented x2;  grossly normal neurologically. Skin:  Intact without significant lesions or rashes. No jaundice. Psych: Altered  Imaging Studies: None  Assessment and Plan:   Henry Black is a 65 y.o.  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

## 2020-11-22 ENCOUNTER — Other Ambulatory Visit: Payer: Self-pay

## 2020-11-22 LAB — CBC
Hematocrit: 38.3 % (ref 37.5–51.0)
Hemoglobin: 12.2 g/dL — ABNORMAL LOW (ref 13.0–17.7)
MCH: 28.5 pg (ref 26.6–33.0)
MCHC: 31.9 g/dL (ref 31.5–35.7)
MCV: 90 fL (ref 79–97)
Platelets: 163 10*3/uL (ref 150–450)
RBC: 4.28 x10E6/uL (ref 4.14–5.80)
RDW: 13.6 % (ref 11.6–15.4)
WBC: 6.1 10*3/uL (ref 3.4–10.8)

## 2020-11-22 LAB — COMPREHENSIVE METABOLIC PANEL
ALT: 13 IU/L (ref 0–44)
AST: 22 IU/L (ref 0–40)
Albumin/Globulin Ratio: 2 (ref 1.2–2.2)
Albumin: 4.6 g/dL (ref 3.8–4.8)
Alkaline Phosphatase: 69 IU/L (ref 44–121)
BUN/Creatinine Ratio: 19 (ref 10–24)
BUN: 20 mg/dL (ref 8–27)
Bilirubin Total: 0.5 mg/dL (ref 0.0–1.2)
CO2: 22 mmol/L (ref 20–29)
Calcium: 9.8 mg/dL (ref 8.6–10.2)
Chloride: 104 mmol/L (ref 96–106)
Creatinine, Ser: 1.04 mg/dL (ref 0.76–1.27)
Globulin, Total: 2.3 g/dL (ref 1.5–4.5)
Glucose: 91 mg/dL (ref 65–99)
Potassium: 4.7 mmol/L (ref 3.5–5.2)
Sodium: 141 mmol/L (ref 134–144)
Total Protein: 6.9 g/dL (ref 6.0–8.5)
eGFR: 81 mL/min/{1.73_m2} (ref >59)

## 2020-11-22 LAB — PROTIME-INR
INR: 1 (ref 0.9–1.2)
Prothrombin Time: 10.6 s (ref 9.1–12.0)

## 2020-11-22 LAB — AFP TUMOR MARKER: AFP, Serum, Tumor Marker: 4.4 ng/mL (ref 0.0–8.4)

## 2020-11-22 LAB — HCV RNA QUANT: Hepatitis C Quantitation: NOT DETECTED IU/mL

## 2020-11-25 ENCOUNTER — Emergency Department (EMERGENCY_DEPARTMENT_HOSPITAL)
Admission: EM | Admit: 2020-11-25 | Discharge: 2020-11-26 | Disposition: A | Discharge status: Other Acute Inpatient Hospital | Payer: Medicare Other | Source: Home / Self Care | Attending: Emergency Medicine | Admitting: Emergency Medicine

## 2020-11-25 ENCOUNTER — Other Ambulatory Visit: Payer: Self-pay

## 2020-11-25 DIAGNOSIS — F17210 Nicotine dependence, cigarettes, uncomplicated: Secondary | ICD-10-CM | POA: Insufficient documentation

## 2020-11-25 DIAGNOSIS — Z79899 Other long term (current) drug therapy: Secondary | ICD-10-CM | POA: Insufficient documentation

## 2020-11-25 DIAGNOSIS — F1910 Other psychoactive substance abuse, uncomplicated: Secondary | ICD-10-CM | POA: Insufficient documentation

## 2020-11-25 DIAGNOSIS — F259 Schizoaffective disorder, unspecified: Secondary | ICD-10-CM | POA: Insufficient documentation

## 2020-11-25 DIAGNOSIS — Z20822 Contact with and (suspected) exposure to covid-19: Secondary | ICD-10-CM | POA: Insufficient documentation

## 2020-11-25 DIAGNOSIS — F29 Unspecified psychosis not due to a substance or known physiological condition: Secondary | ICD-10-CM

## 2020-11-25 DIAGNOSIS — I10 Essential (primary) hypertension: Secondary | ICD-10-CM | POA: Insufficient documentation

## 2020-11-25 DIAGNOSIS — Z046 Encounter for general psychiatric examination, requested by authority: Secondary | ICD-10-CM | POA: Insufficient documentation

## 2020-11-25 LAB — CBC
HCT: 35.9 % — ABNORMAL LOW (ref 39.0–52.0)
Hemoglobin: 12.1 g/dL — ABNORMAL LOW (ref 13.0–17.0)
MCH: 29.3 pg (ref 26.0–34.0)
MCHC: 33.7 g/dL (ref 30.0–36.0)
MCV: 86.9 fL (ref 80.0–100.0)
Platelets: 173 10*3/uL (ref 150–400)
RBC: 4.13 MIL/uL — ABNORMAL LOW (ref 4.22–5.81)
RDW: 13.9 % (ref 11.5–15.5)
WBC: 7.2 10*3/uL (ref 4.0–10.5)
nRBC: 0 % (ref 0.0–0.2)

## 2020-11-25 LAB — COMPREHENSIVE METABOLIC PANEL
ALT: 16 U/L (ref 0–44)
AST: 23 U/L (ref 15–41)
Albumin: 4.2 g/dL (ref 3.5–5.0)
Alkaline Phosphatase: 68 U/L (ref 38–126)
Anion gap: 7 (ref 5–15)
BUN: 26 mg/dL — ABNORMAL HIGH (ref 8–23)
CO2: 24 mmol/L (ref 22–32)
Calcium: 9.5 mg/dL (ref 8.9–10.3)
Chloride: 108 mmol/L (ref 98–111)
Creatinine, Ser: 1.15 mg/dL (ref 0.61–1.24)
GFR, Estimated: >60 mL/min (ref >60)
Glucose, Bld: 141 mg/dL — ABNORMAL HIGH (ref 70–99)
Potassium: 4.2 mmol/L (ref 3.5–5.1)
Sodium: 139 mmol/L (ref 135–145)
Total Bilirubin: 0.6 mg/dL (ref 0.3–1.2)
Total Protein: 6.6 g/dL (ref 6.5–8.1)

## 2020-11-25 LAB — RESP PANEL BY RT-PCR (FLU A&B, COVID) ARPGX2
Influenza A by PCR: NEGATIVE
Influenza B by PCR: NEGATIVE
SARS Coronavirus 2 by RT PCR: NEGATIVE

## 2020-11-25 LAB — ETHANOL: Alcohol, Ethyl (B): <10 mg/dL (ref <10)

## 2020-11-25 NOTE — ED Notes (Signed)
Pt appears to be speaking to someone in his room.

## 2020-11-25 NOTE — ED Notes (Signed)
Patient moved to [facility] 4, oriented to unit with 15 minute rounds and video camera in rooms.

## 2020-11-25 NOTE — ED Notes (Signed)
Pt given a turkey sandwich tray and a cup of sprite.  

## 2020-11-25 NOTE — BH Assessment (Signed)
Comprehensive Clinical Assessment (CCA) Note  11/25/2020 Henry Black [id number]  Henry Black is a 65 year old male who presents to the ER via law enforcement after someone called them. Patient was found on the roof of someone's house and they were fearful he was going to hurt his self. It was also difficult to understand what he was saying. He's speech was pressured and word salad. Per the patient, he was with "some young cats (young adult males)," and they dared him to go on the roof and jump, for an exchange for a "white toy car." As he continued to described the "white toy car" it was unclear if it was cocaine or a toy car. When asked, patient laughed and stated it was "a real car." However, per his description, the young men who challenged him to go on the roof are selling drugs and they have asked him to do things in the past for substances. 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  Per the group home, [facility] Leta Jungling [phone]), the patient has been with them for approximately a year and a half and his current behaviors are not like him. They noticed things changed within the last two weeks and the last two days have been the worse. He hasn't been sleeping, leaving the home late at night. He told her he was using drugs and she believes his current behaviors are because of it. As well as his history of schizophrenia. He's taking his medications but the drugs are not helping with his symptoms. Prior to the last two weeks, the impulsivity and things he is doing he wouldn't consider doing. However, he is doing more high-risk things, such as today when he was on the roof of the house.  During the interview the patient was cooperative and pleasant. At times it was difficult to understand what he was saying due to having pressured speech and talking fast.  Chief Complaint:  Chief Complaint  Patient presents with  . IVC   Visit Diagnosis: Substance Use  Disorder  CCA Screening, Triage and Referral (STR)  Patient Reported Information How did you hear about [place]? Other (Comment)  Referral name: Law Enforcement  Referral phone number: No data recorded  Whom do you see for routine medical problems? No data recorded Practice/Facility Name: No data recorded Practice/Facility Phone Number: No data recorded Name of Contact: No data recorded Contact Number: No data recorded Contact Fax Number: No data recorded Prescriber Name: No data recorded Prescriber Address (if known): No data recorded  What Is the Reason for Your Visit/Call Today? Was found on someone's roof and when law enforcement arrived he was having word salad and difficult to understand.  How Long Has This Been Causing You Problems? 1 wk - 1 month  What Do You Feel Would Help You the Most Today? Alcohol or Drug Use Treatment; Treatment for Depression or other mood problem   Have You Recently Been in Any Inpatient Treatment (Hospital/Detox/Crisis Center/28-Day Program)? No  Name/Location of Program/Hospital:No data recorded How Long Were You There? No data recorded When Were You Discharged? No data recorded  Have You Ever Received Services From [facility] Before? Yes  Who Do You See at [facility]? Medical Treatment   Have You Recently Had Any Thoughts About Hurting Yourself? No  Are You Planning to Commit Suicide/Harm Yourself At This time? No   Have you Recently Had Thoughts About Hurting Someone Karolee Ohs? No  Explanation: No data recorded  Have You  Used Any Alcohol or Drugs in the Past 24 Hours? Yes  How Long Ago Did You Use Drugs or Alcohol? No data recorded What Did You Use and How Much? Alcohol, Cocaine and THC. Unable to quantify the amount.   Do You Currently Have a Therapist/Psychiatrist? No  Name of Therapist/Psychiatrist: No data recorded  Have You Been Recently Discharged From Any Office Practice or Programs? No  Explanation of Discharge From  Practice/Program: No data recorded    CCA Screening Triage Referral Assessment Type of Contact: Face-to-Face  Is this Initial or Reassessment? No data recorded Date Telepsych consult ordered in [facility]:  No data recorded Time Telepsych consult ordered in [facility]:  No data recorded  Patient Reported Information Reviewed? Yes  Patient Left Without Being Seen? No data recorded Reason for Not Completing Assessment: No data recorded  Collateral Involvement: Spoke with Group Home   Does Patient Have a Court Appointed Legal Guardian? No data recorded Name and Contact of Legal Guardian: No data recorded If Minor and Not Living with Parent(s), Who has Custody? No data recorded Is CPS involved or ever been involved? Never  Is APS involved or ever been involved? No data recorded  Patient Determined To Be At Risk for Harm To Self or Others Based on Review of Patient Reported Information or Presenting Complaint? No  Method: No data recorded Availability of Means: No data recorded Intent: No data recorded Notification Required: No data recorded Additional Information for Danger to Others Potential: No data recorded Additional Comments for Danger to Others Potential: No data recorded Are There Guns or Other Weapons in Your Home? No data recorded Types of Guns/Weapons: No data recorded Are These Weapons Safely Secured?                            No data recorded Who Could Verify You Are Able To Have These Secured: No data recorded Do You Have any Outstanding Charges, Pending Court Dates, Parole/Probation? No data recorded Contacted To Inform of Risk of Harm To Self or Others: No data recorded  [facility] of Assessment[facility] ED   Does Patient Present under Involuntary Commitment? Yes  IVC Papers Initial File Date: 11/25/2020   [place]   Patient Currently Receiving the Following Services: Medication Management   Determination of Need: Emergent (2 hours)   Options For  Referral: Other: Comment     CCA Biopsychosocial Intake/Chief Complaint:  Was found on someone's roof and when law enforcement arrived he was having word salad and difficult to understand.  Current Symptoms/Problems: Erratic behavior   Patient Reported Schizophrenia/Schizoaffective Diagnosis in Past: Yes   Strengths: Have some insight into his mental illness.  Preferences: Reports of none  Abilities: Able to care for his basic needs   Type of Services Patient Feels are Needed: States he doesn't know   Initial Clinical Notes/Concerns: n/a   Mental Health Symptoms Depression:  None   Duration of Depressive symptoms: No data recorded  Mania:  Increased Energy; Recklessness   Anxiety:   Difficulty concentrating; Restlessness   Psychosis:  Hallucinations   Duration of Psychotic symptoms: Greater than six months   Trauma:  None   Obsessions:  Disrupts routine/functioning; Poor insight; Recurrent & persistent thoughts/impulses/images   Compulsions:  Disrupts with routine/functioning; "Driven" to perform behaviors/acts   Inattention:  Fails to pay attention/makes careless mistakes   Hyperactivity/Impulsivity:  Always on the go; Difficulty waiting turn; Feeling of restlessness; Fidgets with  hands/feet   Oppositional/Defiant Behaviors:  None   Emotional Irregularity:  None   Other Mood/Personality Symptoms:  No data recorded   Mental Status Exam Appearance and self-care  Stature:  Average   Weight:  Average weight   Clothing:  Neat/clean; Age-appropriate   Grooming:  Normal   Cosmetic use:  None   Posture/gait:  Normal   Motor activity:  -- (Within normal range)   Sensorium  Attention:  Distractible; Inattentive   Concentration:  Preoccupied   Orientation:  X5   Recall/memory:  Normal   Affect and Mood  Affect:  Appropriate; Full Range   Mood:  Other (Comment)   Relating  Eye contact:  Normal   Facial expression:  Responsive   Attitude  toward examiner:  Cooperative; Manipulative   Thought and Language  Speech flow: Clear and Coherent; Loud; Pressured; Slurred   Thought content:  Appropriate to Mood and Circumstances   Preoccupation:  None   Hallucinations:  None   Organization:  No data recorded  [employer] of Knowledge:  Fair   Intelligence:  Average   Abstraction:  Functional   Judgement:  Fair   Dance movement psychotherapist:  Adequate   Insight:  Fair; Poor   Decision Making:  Impulsive   Social Functioning  Social Maturity:  Impulsive   Social Judgement:  Naive; "Street Smart"   Stress  Stressors:  Other (Comment) (Substance use)   Coping Ability:  Overwhelmed; Exhausted   Skill Deficits:  Decision making; Interpersonal   Supports:  Friends/Service system     Religion: Religion/Spirituality Are You A Religious Person?: No  Leisure/Recreation: Leisure / Recreation Do You Have Hobbies?: No  Exercise/Diet: Exercise/Diet Do You Exercise?: No Have You Gained or Lost A Significant Amount of Weight in the Past Six Months?: No Do You Follow a Special Diet?: No Do You Have Any Trouble Sleeping?: No   CCA Employment/Education Employment/Work Situation: Employment / Work Situation Employment situation: On disability Why is patient on disability: Schizophrenia How long has patient been on disability: Unable to quantify Patient's job has been impacted by current illness: No What is the longest time patient has a held a job?: n/a Where was the patient employed at that time?: n/a Has patient ever been in the [employer]?: No  Education:   CCA Family/Childhood History Family and Relationship History: Family history Marital status: Single Are you sexually active?: Yes What is your sexual orientation?: Heterosexual Has your sexual activity been affected by drugs, alcohol, medication, or emotional stress?: Reports of none Does patient have children?: No  Childhood History:   Childhood History By whom was/is the patient raised?: Mother,Sibling Additional childhood history information: Reports he grow up with his mother and four other brothers Description of patient's relationship with caregiver when they were a child: States it was good and they were supportive Patient's description of current relationship with people who raised him/her: States it's still good How were you disciplined when you got in trouble as a child/adolescent?: Reports of no problems or concerns Does patient have siblings?: Yes Number of Siblings: 4 Description of patient's current relationship with siblings: States they get alone well and still speak even though they are in different cities. Did patient suffer any verbal/emotional/physical/sexual abuse as a child?: No Did patient suffer from severe childhood neglect?: No Has patient ever been sexually abused/assaulted/raped as an adolescent or adult?: No Was the patient ever a victim of a crime or a disaster?: No Witnessed domestic violence?: No Has patient  been affected by domestic violence as an adult?: No  Child/Adolescent Assessment:     CCA Substance Use Alcohol/Drug Use: Alcohol / Drug Use Pain Medications: See PTA Prescriptions: See PTA Over the Counter: See PTA History of alcohol / drug use?: Yes Longest period of sobriety (when/how long): Unable to quantify Negative Consequences of Use: Personal relationships Substance #1 Name of Substance 1: Cocaine 1 - Amount (size/oz): Unable to quantify 1 - Last Use / Amount: 11/24/2020 1- Route of Use: Snort Substance #2 Name of Substance 2: Alcohol 2 - Amount (size/oz): Unable to quantify 2 - Last Use / Amount: 11/24/2020 2 - Route of Substance Use: Oral Substance #3 Name of Substance 3: Cannabis 3 - Amount (size/oz): Unable to quantify 3 - Last Use / Amount: 11/24/2020 3 - Route of Substance Use: Smoke   ASAM's:  Six Dimensions of Multidimensional  Assessment  Dimension 1:  Acute Intoxication and/or Withdrawal Potential:      Dimension 2:  Biomedical Conditions and Complications:      Dimension 3:  Emotional, Behavioral, or Cognitive Conditions and Complications:     Dimension 4:  Readiness to Change:     Dimension 5:  Relapse, Continued use, or Continued Problem Potential:     Dimension 6:  Recovery/Living Environment:     ASAM Severity Score:    ASAM Recommended Level of Treatment:     Substance use Disorder (SUD) Substance Use Disorder (SUD)  Checklist Symptoms of Substance Use: Continued use despite having a persistent/recurrent physical/psychological problem caused/exacerbated by use,Continued use despite persistent or recurrent social, interpersonal problems, caused or exacerbated by use,Evidence of tolerance,Large amounts of time spent to obtain, use or recover from the substance(s),Persistent desire or unsuccessful efforts to cut down or control use,Repeated use in physically hazardous situations,Substance(s) often taken in larger amounts or over longer times than was intended  Recommendations for Services/Supports/Treatments:  DSM5 Diagnoses: Patient Active Problem List   Diagnosis Date Noted  . Chronic hepatitis C (HCC) 03/29/2020  . Hepatic cirrhosis (HCC) 03/29/2020  . Aggressive behavior 09/17/2018    Patient Centered Plan: Patient is on the following Treatment Plan(s):  Substance Abuse   Referrals to Alternative Service(s): Referred to Alternative Service(s):   Place:   Date:   Time:    Referred to Alternative Service(s):   Place:   Date:   Time:    Referred to Alternative Service(s):   Place:   Date:   Time:    Referred to Alternative Service(s):   Place:   Date:   Time:     Lilyan Gilford MS, LCAS, [facility], [facility] Therapeutic Triage Specialist 11/25/2020 7:15 PM

## 2020-11-25 NOTE — ED Notes (Signed)
Per Dr Fuller Plan, pt does not need further lab work up and is medically cleared for BHU.

## 2020-11-25 NOTE — ED Triage Notes (Signed)
Pt comes under IVC with [employer] after being found on top of a neighbor's roof and laying in random yards. Pt lives at a group home. Pt talking in a word salad. Calm, cooperative.

## 2020-11-25 NOTE — ED Provider Notes (Signed)
[facility] Emergency Department Provider Note   ____________________________________________    I have reviewed the triage vital signs and the nursing notes.   HISTORY  Chief Complaint IVC     HPI Henry Black is a 65 y.o. male who presents via police under IVC, apparently with bizarre behavior, found on top of a roof.  Patient is unable to give any significant history at this time  Past Medical History:  Diagnosis Date  . Hepatitis   . Hypertension     Patient Active Problem List   Diagnosis Date Noted  . Chronic hepatitis C (HCC) 03/29/2020  . Hepatic cirrhosis (HCC) 03/29/2020  . Aggressive behavior 09/17/2018    Past Surgical History:  Procedure Laterality Date  . ESOPHAGOGASTRODUODENOSCOPY (EGD) WITH PROPOFOL N/A 12/21/2019   Procedure: ESOPHAGOGASTRODUODENOSCOPY (EGD) WITH PROPOFOL;  Surgeon: Toney Reil, MD;  Location: [facility] ENDOSCOPY;  Service: Gastroenterology;  Laterality: N/A;    Prior to Admission medications   Medication Sig Start Date End Date Taking? Authorizing Provider  atorvastatin (LIPITOR) 20 MG tablet Take 20 mg by mouth daily.    [provider]  benztropine (COGENTIN) 2 MG tablet Take 2 mg by mouth 2 (two) times daily.    [provider]  Cholecalciferol 2000 units CAPS Take 2,000 Units by mouth daily.    [provider]  clonazePAM (KLONOPIN) 1 MG tablet Take 1 mg by mouth 2 (two) times daily.    [provider]  haloperidol decanoate (HALDOL DECANOATE) 100 MG/ML injection Inject 150 mg into the muscle every 28 (twenty-eight) days.    [provider]  losartan (COZAAR) 100 MG tablet Take 100 mg by mouth daily.    [provider]  LUCEMYRA 0.18 MG TABS  04/25/20   [provider]  Sofosbuvir-Velpatasvir (EPCLUSA) 400-100 MG TABS Take 1 tablet by mouth daily. 04/24/20   Toney Reil, MD     Allergies Penicillins  History reviewed. No  pertinent family history.  Social History Social History   Tobacco Use  . Smoking status: Current Every Day Smoker    Packs/day: 0.50    Years: 46.00    Pack years: 23.00    Types: Cigarettes  . Smokeless tobacco: Never Used  Vaping Use  . Vaping Use: Never used  Substance Use Topics  . Alcohol use: Yes    Alcohol/week: 1.0 standard drink    Types: 1 Standard drinks or equivalent per week  . Drug use: Not Currently    Unable to obtain review of Systems     ____________________________________________   PHYSICAL EXAM:  VITAL SIGNS: ED Triage Vitals  Enc Vitals Group     BP 11/25/20 1431 120/73     Pulse Rate 11/25/20 1431 89     Resp 11/25/20 1431 18     Temp 11/25/20 1431 98.7 F (37.1 C)     Temp Source 11/25/20 1431 Oral     SpO2 11/25/20 1431 98 %     Weight 11/25/20 1429 68 kg (149 lb 14.6 oz)     Height 11/25/20 1429 1.854 m (6\' 1" )     Head Circumference --      Peak Flow --      Pain Score 11/25/20 1429 0     Pain Loc --      Pain Edu? --      Excl. in GC? --     Constitutional: Alert  Nose: No congestion/rhinnorhea. Mouth/Throat: Mucous membranes are moist.  Cardiovascular: Normal rate, regular rhythm. Grossly normal heart sounds.  Good peripheral circulation. Respiratory: Normal respiratory effort.  No retractions. Lungs CTAB. Gastrointestinal: Soft and nontender. No distention.  No CVA tenderness.  Musculoskeletal: No lower extremity tenderness nor edema.  Warm and well perfused Neurologic:  Normal speech and language. No gross focal neurologic deficits are appreciated.  Skin:  Skin is warm, dry and intact. No rash noted. Psychiatric: Calm, cooperative, difficult to understand slightly pressured speech  ____________________________________________   LABS (all labs ordered are listed, but only abnormal results are displayed)  Labs Reviewed  RESP PANEL BY RT-PCR (FLU A&B, COVID) ARPGX2  COMPREHENSIVE METABOLIC PANEL  ETHANOL  CBC   URINE DRUG SCREEN, QUALITATIVE ([facility] ONLY)   ____________________________________________  EKG  None ____________________________________________  RADIOLOGY  None ____________________________________________   PROCEDURES  Procedure(s) performed: No  Procedures   Critical Care performed: No ____________________________________________   INITIAL IMPRESSION / ASSESSMENT AND PLAN / ED COURSE  Pertinent labs & imaging results that were available during my care of the patient were reviewed by me and considered in my medical decision making (see chart for details).  Patient presents reportedly with a history of mental illness with bizarre behavior, found have approved.  IVC completed given dangerous behavior.  Labs pending including UDS.  Psychiatry and TTS consultation.   The patient has been placed in psychiatric observation due to the need to provide a safe environment for the patient while obtaining psychiatric consultation and evaluation, as well as ongoing medical and medication management to treat the patient's condition.  The patient has been placed under full IVC at this time.    ____________________________________________   FINAL CLINICAL IMPRESSION(S) / ED DIAGNOSES  Mental illness     Note:  This document was prepared using Dragon voice recognition software and may include unintentional dictation errors.   Jene Every, MD 11/25/20 1505

## 2020-11-25 NOTE — ED Notes (Signed)
IVC, pend Psych consult

## 2020-11-25 NOTE — ED Notes (Signed)
Food tray was given with juice. 

## 2020-11-25 NOTE — ED Notes (Signed)
Pt belongings include one black tshirt, two sweatshirts, one red jacket, one pair black pants, two tennis shoes, two socks, one pair underwear. 1/1 belongings bag.

## 2020-11-26 ENCOUNTER — Other Ambulatory Visit: Payer: Self-pay

## 2020-11-26 ENCOUNTER — Encounter: Payer: Self-pay | Admitting: Psychiatry

## 2020-11-26 ENCOUNTER — Inpatient Hospital Stay
Admission: RE | Admit: 2020-11-26 | Discharge: 2020-12-12 | DRG: 885 | Disposition: A | Discharge status: Home / Self Care | Payer: Medicare Other | Source: Intra-hospital | Attending: Behavioral Health | Admitting: Behavioral Health

## 2020-11-26 DIAGNOSIS — Z880 Allergy status to penicillin: Secondary | ICD-10-CM

## 2020-11-26 DIAGNOSIS — I10 Essential (primary) hypertension: Secondary | ICD-10-CM | POA: Diagnosis present

## 2020-11-26 DIAGNOSIS — F250 Schizoaffective disorder, bipolar type: Secondary | ICD-10-CM

## 2020-11-26 DIAGNOSIS — R32 Unspecified urinary incontinence: Secondary | ICD-10-CM | POA: Diagnosis present

## 2020-11-26 DIAGNOSIS — Z20822 Contact with and (suspected) exposure to covid-19: Secondary | ICD-10-CM | POA: Diagnosis present

## 2020-11-26 DIAGNOSIS — M79606 Pain in leg, unspecified: Secondary | ICD-10-CM | POA: Diagnosis not present

## 2020-11-26 DIAGNOSIS — R471 Dysarthria and anarthria: Secondary | ICD-10-CM | POA: Diagnosis present

## 2020-11-26 DIAGNOSIS — F17210 Nicotine dependence, cigarettes, uncomplicated: Secondary | ICD-10-CM | POA: Diagnosis present

## 2020-11-26 DIAGNOSIS — F419 Anxiety disorder, unspecified: Secondary | ICD-10-CM | POA: Diagnosis present

## 2020-11-26 DIAGNOSIS — B182 Chronic viral hepatitis C: Secondary | ICD-10-CM | POA: Diagnosis present

## 2020-11-26 DIAGNOSIS — F203 Undifferentiated schizophrenia: Principal | ICD-10-CM | POA: Diagnosis present

## 2020-11-26 DIAGNOSIS — G4700 Insomnia, unspecified: Secondary | ICD-10-CM | POA: Diagnosis present

## 2020-11-26 DIAGNOSIS — Z79899 Other long term (current) drug therapy: Secondary | ICD-10-CM

## 2020-11-26 DIAGNOSIS — R42 Dizziness and giddiness: Secondary | ICD-10-CM | POA: Diagnosis not present

## 2020-11-26 DIAGNOSIS — F259 Schizoaffective disorder, unspecified: Secondary | ICD-10-CM

## 2020-11-26 MED ORDER — LORAZEPAM 2 MG PO TABS: 2.0000 mg | ORAL_TABLET | Freq: Four times a day (QID) | ORAL | Status: DC | PRN

## 2020-11-26 MED ORDER — LORAZEPAM 2 MG/ML IJ SOLN: 2.0000 mg | Freq: Four times a day (QID) | INTRAMUSCULAR | Status: DC | PRN

## 2020-11-26 MED ORDER — DIAZEPAM 5 MG PO TABS
5.0000 mg | ORAL_TABLET | Freq: Once | ORAL | Status: AC
  Administered 2020-11-26: 5 mg via ORAL
  Filled 2020-11-26: qty 1

## 2020-11-26 MED ORDER — DIPHENHYDRAMINE HCL 50 MG/ML IJ SOLN
INTRAMUSCULAR | Status: AC
  Administered 2020-11-26: 50 mg via INTRAMUSCULAR
  Filled 2020-11-26: qty 1

## 2020-11-26 MED ORDER — ALUM & MAG HYDROXIDE-SIMETH 200-200-20 MG/5ML PO SUSP: 30.0000 mL | ORAL | Status: DC | PRN

## 2020-11-26 MED ORDER — LOSARTAN POTASSIUM 50 MG PO TABS
100.0000 mg | ORAL_TABLET | Freq: Every day | ORAL | Status: DC
  Filled 2020-11-26: qty 2

## 2020-11-26 MED ORDER — LORAZEPAM 2 MG PO TABS
2.0000 mg | ORAL_TABLET | Freq: Four times a day (QID) | ORAL | Status: DC | PRN
  Administered 2020-11-26 – 2020-12-05 (×14): 2 mg via ORAL
  Filled 2020-11-26 (×14): qty 1

## 2020-11-26 MED ORDER — HALOPERIDOL LACTATE 5 MG/ML IJ SOLN
INTRAMUSCULAR | Status: AC
  Administered 2020-11-26: 5 mg via INTRAMUSCULAR
  Filled 2020-11-26: qty 1

## 2020-11-26 MED ORDER — ACETAMINOPHEN 325 MG PO TABS
650.0000 mg | ORAL_TABLET | Freq: Four times a day (QID) | ORAL | Status: DC | PRN
  Administered 2020-11-27 – 2020-12-09 (×4): 650 mg via ORAL
  Filled 2020-11-26 (×5): qty 2

## 2020-11-26 MED ORDER — ATORVASTATIN CALCIUM 20 MG PO TABS
20.0000 mg | ORAL_TABLET | Freq: Every day | ORAL | Status: DC
  Administered 2020-11-26: 20 mg via ORAL
  Filled 2020-11-26: qty 1

## 2020-11-26 MED ORDER — HYDROXYZINE HCL 25 MG PO TABS
50.0000 mg | ORAL_TABLET | Freq: Once | ORAL | Status: AC
  Administered 2020-11-26: 50 mg via ORAL
  Filled 2020-11-26: qty 2

## 2020-11-26 MED ORDER — HYDROXYZINE HCL 50 MG PO TABS
50.0000 mg | ORAL_TABLET | Freq: Three times a day (TID) | ORAL | Status: DC | PRN
  Administered 2020-11-27 – 2020-12-11 (×8): 50 mg via ORAL
  Filled 2020-11-26 (×8): qty 1

## 2020-11-26 MED ORDER — BENZTROPINE MESYLATE 1 MG PO TABS
1.0000 mg | ORAL_TABLET | Freq: Two times a day (BID) | ORAL | Status: DC
  Administered 2020-11-26 – 2020-12-12 (×31): 1 mg via ORAL
  Filled 2020-11-26 (×33): qty 1

## 2020-11-26 MED ORDER — BENZTROPINE MESYLATE 1 MG PO TABS
1.0000 mg | ORAL_TABLET | Freq: Two times a day (BID) | ORAL | Status: DC
  Administered 2020-11-26: 1 mg via ORAL
  Filled 2020-11-26: qty 1

## 2020-11-26 MED ORDER — MAGNESIUM HYDROXIDE 400 MG/5ML PO SUSP: 30.0000 mL | Freq: Every day | ORAL | Status: DC | PRN

## 2020-11-26 MED ORDER — ATORVASTATIN CALCIUM 20 MG PO TABS
20.0000 mg | ORAL_TABLET | Freq: Every day | ORAL | Status: DC
  Administered 2020-11-27 – 2020-12-12 (×16): 20 mg via ORAL
  Filled 2020-11-26 (×17): qty 1

## 2020-11-26 MED ORDER — DIPHENHYDRAMINE HCL 50 MG/ML IJ SOLN: 50.0000 mg | Freq: Once | INTRAMUSCULAR | Status: AC

## 2020-11-26 MED ORDER — LORAZEPAM 2 MG/ML IJ SOLN: 2.0000 mg | Freq: Once | INTRAMUSCULAR | Status: AC

## 2020-11-26 MED ORDER — CLONAZEPAM 1 MG PO TABS
1.0000 mg | ORAL_TABLET | Freq: Two times a day (BID) | ORAL | Status: DC
  Administered 2020-11-26: 1 mg via ORAL
  Filled 2020-11-26: qty 1

## 2020-11-26 MED ORDER — CLONAZEPAM 1 MG PO TABS
1.0000 mg | ORAL_TABLET | Freq: Two times a day (BID) | ORAL | Status: DC
  Administered 2020-11-26 – 2020-12-12 (×31): 1 mg via ORAL
  Filled 2020-11-26 (×32): qty 1

## 2020-11-26 MED ORDER — HALOPERIDOL LACTATE 5 MG/ML IJ SOLN: 5.0000 mg | Freq: Once | INTRAMUSCULAR | Status: AC

## 2020-11-26 MED ORDER — HALOPERIDOL 5 MG PO TABS
5.0000 mg | ORAL_TABLET | Freq: Two times a day (BID) | ORAL | Status: DC
  Administered 2020-11-26 – 2020-11-30 (×9): 5 mg via ORAL
  Filled 2020-11-26 (×10): qty 1

## 2020-11-26 MED ORDER — LORAZEPAM 2 MG/ML IJ SOLN
INTRAMUSCULAR | Status: AC
  Administered 2020-11-26: 2 mg via INTRAMUSCULAR
  Filled 2020-11-26: qty 1

## 2020-11-26 MED ORDER — HALOPERIDOL 5 MG PO TABS: 5.0000 mg | ORAL_TABLET | Freq: Two times a day (BID) | ORAL | Status: DC

## 2020-11-26 MED ORDER — LOSARTAN POTASSIUM 50 MG PO TABS
100.0000 mg | ORAL_TABLET | Freq: Every day | ORAL | Status: DC
  Administered 2020-11-27 – 2020-12-12 (×15): 100 mg via ORAL
  Filled 2020-11-26 (×16): qty 2

## 2020-11-26 NOTE — ED Notes (Signed)
Pt standing at nurse's station door watching staff.

## 2020-11-26 NOTE — ED Notes (Signed)
Pt ate dinner tray.

## 2020-11-26 NOTE — Consult Note (Signed)
Chart reviewed and met with patient.  65 year old man with schizophrenia and probable recent substance abuse.  Patient currently is agitated.  He is pacing in the behavioral health unit and talking loudly and frequently.  Also crying.  Does not make very much sense.  Talking about having killed a little girl.  Not clear to me whether he is saying that he did do it or did not do it in any case he is not aggressive to anyone else at this time and does not appear to be trying to harm anyone or himself but does appear to be in a great deal of distress and psychotic.  Orders placed for one-time haloperidol Ativan and Benadryl shot.  Patient is appropriate for admission.  Agree with note from nurse practitioner Doren Custard earlier today.  Orders will be placed for admission to our psychiatric unit.

## 2020-11-26 NOTE — Tx Team (Signed)
Initial Treatment Plan 11/26/2020 9:51 PM [name] NOM:[id number]    PATIENT STRESSORS: Educational concerns Medication change or noncompliance   PATIENT STRENGTHS: Physical Health Supportive family/friends   PATIENT IDENTIFIED PROBLEMS: Acute Psychosis  Anxiety                   DISCHARGE CRITERIA:  Motivation to continue treatment in a less acute level of care Verbal commitment to aftercare and medication compliance  PRELIMINARY DISCHARGE PLAN: Outpatient therapy Return to previous living arrangement  PATIENT/FAMILY INVOLVEMENT: This treatment plan has been presented to and reviewed with the patient, Henry Black. The patient has been given the opportunity to ask questions and make suggestions.  Elmyra Ricks, RN 11/26/2020, 9:51 PM

## 2020-11-26 NOTE — ED Notes (Signed)
This RN attempted to call report to BMU with no answer.

## 2020-11-26 NOTE — Progress Notes (Signed)
Patient admitted from [facility] - ED, report received from Amy, RN. Patient presents with flight of ideas and was unable to answer most assessment questions. Patient was redirectable and accepting of the rules of the unit. Patient oriented to the unit and his room, given snack. Skin assessment completed with Cleo, no abnormalities found, no contraband found. Patient given education, support, and encouragement to be active in his treatment plan. Patient being monitored Q 15 minutes for safety per unit protocol. Pt remains safe on the unit.

## 2020-11-26 NOTE — ED Notes (Signed)
Resumed care from annie rn.  Pt knocking on nursing station door for blanket.  Pt alert, calm and cooperative.

## 2020-11-26 NOTE — ED Provider Notes (Signed)
-----------------------------------------   6:32 AM on 11/26/2020 -----------------------------------------  Patient accepted to BMU later this morning.   Irean Hong, MD 11/26/20 [phone]

## 2020-11-26 NOTE — ED Notes (Signed)
Pt in dayroom  

## 2020-11-26 NOTE — ED Provider Notes (Signed)
Emergency Medicine Observation Re-evaluation Note  Henry Black is a 65 y.o. male, seen on rounds today.  Pt initially presented to the ED for complaints of IVC Currently, the patient is resting.  Physical Exam  BP 120/73   Pulse 89   Temp 98.7 F (37.1 C) (Oral)   Resp 18   Ht 6\' 1"  (1.854 m)   Wt 68 kg   SpO2 98%   BMI 19.78 kg/m  Physical Exam General: NAD Cardiac: well perfused Lungs: even and unlabored Psych: currently calm/resting  ED Course / MDM  EKG:   I have reviewed the labs performed to date as well as medications administered while in observation.  Recent changes in the last 24 hours include none.  Plan  Current plan is for psych dispo. Patient is under full IVC at this time.   , MD 11/26/20 [phone]

## 2020-11-26 NOTE — Plan of Care (Signed)
Patient new to the unit, hasn't had time to progress  Problem: Education: Goal: Knowledge of [facility] [place] [facility] Education information/materials will improve Outcome: Not Progressing Goal: Emotional status will improve Outcome: Not Progressing Goal: Mental status will improve Outcome: Not Progressing Goal: Verbalization of understanding the information provided will improve Outcome: Not Progressing   Problem: Safety: Goal: Periods of time without injury will increase Outcome: Not Progressing   Problem: Activity: Goal: Will verbalize the importance of balancing activity with adequate rest periods Outcome: Not Progressing   Problem: Safety: Goal: Ability to redirect hostility and anger into socially appropriate behaviors will improve Outcome: Not Progressing Goal: Ability to remain free from injury will improve Outcome: Not Progressing   

## 2020-11-26 NOTE — ED Notes (Signed)
Receiving RN unable to take report at this time.

## 2020-11-26 NOTE — ED Notes (Signed)
Patient given ice cream and graham crackers per request.

## 2020-11-26 NOTE — ED Notes (Signed)
This RN attempted to call report x2 with no answer 

## 2020-11-26 NOTE — ED Notes (Signed)
This RN attempted to call report to BMU. Tresa Endo, RN informed this RN they could not take report due to being in progress report meeting and that she would call this RN back.

## 2020-11-26 NOTE — ED Notes (Signed)
Pt standing at nursing station door watching staff.

## 2020-11-26 NOTE — ED Notes (Signed)
Lovette Cliche, RN informed this RN that Dr. Toni Amend is going to reassess patient before being admitted downstairs.

## 2020-11-26 NOTE — Consult Note (Incomplete)
[facility] Face-to-Face Psychiatry Consult   Reason for Consult:  Psych evaluation Referring Physician:  Dr. Dolores Frame Patient Identification: Henry Black MRN:  [id number] Principal Diagnosis: <principal problem not specified> Diagnosis:  Active Problems:   * No active hospital problems. *   Total Time spent with patient: 1 hour  Subjective:   Henry Black is a 65 y.o. male patient admitted with ***.  HPI:  ***  Past Psychiatric History: ***  Risk to Self:   Risk to Others:   Prior Inpatient Therapy:   Prior Outpatient Therapy:    Past Medical History:  Past Medical History:  Diagnosis Date  . Hepatitis   . Hypertension     Past Surgical History:  Procedure Laterality Date  . ESOPHAGOGASTRODUODENOSCOPY (EGD) WITH PROPOFOL N/A 12/21/2019   Procedure: ESOPHAGOGASTRODUODENOSCOPY (EGD) WITH PROPOFOL;  Surgeon: Toney Reil, MD;  Location: [facility] ENDOSCOPY;  Service: Gastroenterology;  Laterality: N/A;   Family History: History reviewed. No pertinent family history. Family Psychiatric  History: *** Social History:  Social History   Substance and Sexual Activity  Alcohol Use Yes  . Alcohol/week: 1.0 standard drink  . Types: 1 Standard drinks or equivalent per week     Social History   Substance and Sexual Activity  Drug Use Not Currently    Social History   Socioeconomic History  . Marital status: Single    Spouse name: Not on file  . Number of children: Not on file  . Years of education: Not on file  . Highest education level: Not on file  Occupational History  . Not on file  Tobacco Use  . Smoking status: Current Every Day Smoker    Packs/day: 0.50    Years: 46.00    Pack years: 23.00    Types: Cigarettes  . Smokeless tobacco: Never Used  Vaping Use  . Vaping Use: Never used  Substance and Sexual Activity  . Alcohol use: Yes    Alcohol/week: 1.0 standard drink    Types: 1 Standard drinks or equivalent per week  . Drug use: Not Currently  . Sexual  activity: Not on file  Other Topics Concern  . Not on file  Social History Narrative  . Not on file   Social Determinants of Health   Financial Resource Strain: Not on file  Food Insecurity: Not on file  Transportation Needs: Not on file  Physical Activity: Not on file  Stress: Not on file  Social Connections: Not on file   Additional Social History:    Allergies:   Allergies  Allergen Reactions  . Penicillins Rash    Has patient had a PCN reaction causing immediate rash, facial/tongue/throat swelling, SOB or lightheadedness with hypotension: Unknown Has patient had a PCN reaction causing severe rash involving mucus membranes or skin necrosis: Unknown Has patient had a PCN reaction that required hospitalization: Unknown Has patient had a PCN reaction occurring within the last 10 years: Unknown If all of the above answers are "NO", then may proceed with Cephalosporin use.     Labs:  Results for orders placed or performed during the hospital encounter of 11/25/20 (from the past 48 hour(s))  Comprehensive metabolic panel     Status: Abnormal   Collection Time: 11/25/20  2:40 PM  Result Value Ref Range   Sodium 139 135 - 145 mmol/L   Potassium 4.2 3.5 - 5.1 mmol/L   Chloride 108 98 - 111 mmol/L   CO2 24 22 - 32 mmol/L   Glucose, Bld 141 (H) 70 -  99 mg/dL    Comment: Glucose reference range applies only to samples taken after fasting for at least 8 hours.   BUN 26 (H) 8 - 23 mg/dL   Creatinine, Ser 1.611.15 0.61 - 1.24 mg/dL   Calcium 9.5 8.9 - 09.610.3 mg/dL   Total Protein 6.6 6.5 - 8.1 g/dL   Albumin 4.2 3.5 - 5.0 g/dL   AST 23 15 - 41 U/L   ALT 16 0 - 44 U/L   Alkaline Phosphatase 68 38 - 126 U/L   Total Bilirubin 0.6 0.3 - 1.2 mg/dL   GFR, Estimated >04>60 >54>60 mL/min    Comment: (NOTE) Calculated using the CKD-EPI Creatinine Equation (2021)    Anion gap 7 5 - 15    Comment: Performed at [facility], [address] [place], [place] [address]  Ethanol      Status: None   Collection Time: 11/25/20  2:40 PM  Result Value Ref Range   Alcohol, Ethyl (B) <10 <10 mg/dL    Comment: (NOTE) Lowest detectable limit for serum alcohol is 10 mg/dL.  For medical purposes only. Performed at [facility], [address] [place], [place] [address]   cbc     Status: Abnormal   Collection Time: 11/25/20  2:40 PM  Result Value Ref Range   WBC 7.2 4.0 - 10.5 K/uL   RBC 4.13 (L) 4.22 - 5.81 MIL/uL   Hemoglobin 12.1 (L) 13.0 - 17.0 g/dL   HCT 29.535.9 (L) 62.139.0 - 30.852.0 %   MCV 86.9 80.0 - 100.0 fL   MCH 29.3 26.0 - 34.0 pg   MCHC 33.7 30.0 - 36.0 g/dL   RDW 65.713.9 84.611.5 - 96.215.5 %   Platelets 173 150 - 400 K/uL   nRBC 0.0 0.0 - 0.2 %    Comment: Performed at [facility], [address] [place], [place] [address]  Resp Panel by RT-PCR (Flu A&B, Covid) Nasopharyngeal Swab     Status: None   Collection Time: 11/25/20  4:40 PM   Specimen: Nasopharyngeal Swab; Nasopharyngeal(NP) swabs in vial transport medium  Result Value Ref Range   SARS Coronavirus 2 by RT PCR NEGATIVE NEGATIVE    Comment: (NOTE) SARS-CoV-2 target nucleic acids are NOT DETECTED.  The SARS-CoV-2 RNA is generally detectable in upper respiratory specimens during the acute phase of infection. The lowest concentration of SARS-CoV-2 viral copies this assay can detect is 138 copies/mL. A negative result does not preclude SARS-Cov-2 infection and should not be used as the sole basis for treatment or other patient management decisions. A negative result may occur with  improper specimen collection/handling, submission of specimen other than nasopharyngeal swab, presence of viral mutation(s) within the areas targeted by this assay, and inadequate number of viral copies(<138 copies/mL). A negative result must be combined with clinical observations, patient history, and epidemiological information. The expected result is Negative.  Fact Sheet for Patients:   BloggerCourse.comhttps://www.fda.gov/media/152166/download  Fact Sheet for Healthcare Providers:  SeriousBroker.ithttps://www.fda.gov/media/152162/download  This test is no t yet approved or cleared by the [place] FDA and  has been authorized for detection and/or diagnosis of SARS-CoV-2 by FDA under an Emergency Use Authorization (EUA). This EUA will remain  in effect (meaning this test can be used) for the duration of the COVID-19 declaration under Section 564(b)(1) of the Act, 21 U.S.C.section 360bbb-3(b)(1), unless the authorization is terminated  or revoked sooner.       Influenza A by PCR NEGATIVE NEGATIVE   Influenza B by  PCR NEGATIVE NEGATIVE    Comment: (NOTE) The Xpert Xpress SARS-CoV-2/FLU/RSV plus assay is intended as an aid in the diagnosis of influenza from Nasopharyngeal swab specimens and should not be used as a sole basis for treatment. Nasal washings and aspirates are unacceptable for Xpert Xpress SARS-CoV-2/FLU/RSV testing.  Fact Sheet for Patients: BloggerCourse.com  Fact Sheet for Healthcare Providers: SeriousBroker.it  This test is not yet approved or cleared by the [place] FDA and has been authorized for detection and/or diagnosis of SARS-CoV-2 by FDA under an Emergency Use Authorization (EUA). This EUA will remain in effect (meaning this test can be used) for the duration of the COVID-19 declaration under Section 564(b)(1) of the Act, 21 U.S.C. section 360bbb-3(b)(1), unless the authorization is terminated or revoked.  Performed at [facility], [address] [place], [place] [address]     No current facility-administered medications for this encounter.   Current Outpatient Medications  Medication Sig Dispense Refill  . atorvastatin (LIPITOR) 20 MG tablet Take 20 mg by mouth daily.    . benztropine (COGENTIN) 2 MG tablet Take 2 mg by mouth 2 (two) times daily.    . Cholecalciferol 2000 units CAPS Take  2,000 Units by mouth daily.    . clonazePAM (KLONOPIN) 1 MG tablet Take 1 mg by mouth 2 (two) times daily.    . haloperidol decanoate (HALDOL DECANOATE) 100 MG/ML injection Inject 150 mg into the muscle every 28 (twenty-eight) days.    Marland Kitchen losartan (COZAAR) 100 MG tablet Take 100 mg by mouth daily.    . LUCEMYRA 0.18 MG TABS     . Sofosbuvir-Velpatasvir (EPCLUSA) 400-100 MG TABS Take 1 tablet by mouth daily. 28 tablet 2    Musculoskeletal: Strength & Muscle Tone: within normal limits Gait & Station: normal Patient leans: N/A  Psychiatric Specialty Exam:  Presentation  General Appearance: Bizarre; Disheveled  Eye Contact:Fleeting  Speech:Garbled; Pressured; Slurred  Speech Volume:Increased  Handedness:Right   Mood and Affect  Mood:-- (unable to assess)  Affect:Inappropriate; Full Range   Thought Process  Thought Processes:Disorganized  Descriptions of Associations:Tangential  Orientation:Other (comment) (unable to assess)  Thought Content:Illogical  History of Schizophrenia/Schizoaffective disorder:Yes  Duration of Psychotic Symptoms:Greater than six months  Hallucinations:Hallucinations: Other (comment) (unable to assess)  Ideas of Reference:-- (unable to assess)  Suicidal Thoughts:Suicidal Thoughts: -- (unable to assess)  Homicidal Thoughts:No data recorded  Sensorium  Memory:No data recorded Judgment:Impaired  Insight:None   Executive Functions  Concentration:Poor  Attention Span:Poor  Recall:Poor  Fund of Knowledge:Poor  Language:Poor   Psychomotor Activity  Psychomotor Activity:Psychomotor Activity: Restlessness; Tremor   Assets  Assets:Other (comment)   Sleep  Sleep:Sleep: Poor   Physical Exam: Physical Exam Vitals and nursing note reviewed.  Constitutional:      Appearance: Normal appearance.  HENT:     Head: Normocephalic and atraumatic.     Nose: Nose normal.  Eyes:     Pupils: Pupils are equal, round, and reactive to  light.  Cardiovascular:     Pulses: Normal pulses.  Pulmonary:     Effort: Pulmonary effort is normal.  Musculoskeletal:        General: Normal range of motion.     Cervical back: Normal range of motion.  Skin:    General: Skin is warm and dry.  Neurological:     General: No focal deficit present.     Mental Status: He is alert. He is disoriented.  Psychiatric:        Attention and Perception: He  is inattentive.        Mood and Affect: Mood is anxious. Affect is inappropriate.        Speech: Speech is tangential.        Behavior: Behavior is uncooperative.        Thought Content: Thought content is delusional.        Cognition and Memory: Cognition is impaired. Memory is impaired.        Judgment: Judgment is impulsive and inappropriate.    Review of Systems  Psychiatric/Behavioral: Positive for hallucinations.  All other systems reviewed and are negative.  Blood pressure 120/73, pulse 89, temperature 98.7 F (37.1 C), temperature source Oral, resp. rate 18, height 6\' 1"  (1.854 m), weight 68 kg, SpO2 98 %. Body mass index is 19.78 kg/m.  Treatment Plan Summary: Daily contact with patient to assess and evaluate symptoms and progress in treatment and Medication management  Disposition: Recommend psychiatric Inpatient admission when medically cleared. Supportive therapy provided about ongoing stressors. Discussed crisis plan, support from social network, calling 911, coming to the Emergency Department, and calling Suicide Hotline.  , NP 11/26/2020 3:04 AM

## 2020-11-26 NOTE — BH Assessment (Signed)
Patient is to be admitted to [facility] by Psychiatric Nurse Practitioner Rashaun.  Attending Physician will be Dr. Neale Burly.   Patient has been assigned to room 301, by [facility] Charge Nurse Britta Mccreedy.   Intake Paper Work has been signed and placed on patient chart.  ER staff is aware of the admission: Glenda, ER Secretary   Dr. Dolores Frame, ER MD  Alvis Lemmings, Patient's Nurse  Marylene Land, Patient Access.  Please coordinate with the unit concerning admission time.

## 2020-11-26 NOTE — ED Notes (Signed)
IVC/Consult completed/ Recommend Inpt Admit 

## 2020-11-26 NOTE — ED Notes (Signed)
Pt not aggressive at this time, but appears to be distressed while Dr. Toni Amend assessing patient. Patient pacing in day room and unable to be calmed down verbally. Pt remains with word salad and not making coherent sentences. Pt willingly took IM medications as ordered. No mechanical restraint needed. Pt remains in dayroom after IM administration. Pt appears to be in NAD at this time.

## 2020-11-26 NOTE — Consult Note (Signed)
[facility] Face-to-Face Psychiatry Consult   Reason for Consult: Psych evalutation Referring Physician:  Dr. Fuller Plan Patient Identification: Henry Black MRN:  [id number] Principal Diagnosis: Schizoaffective disorder [facility]) Diagnosis:  Principal Problem:   Schizoaffective disorder (HCC)   Total Time spent with patient: 1 hour  Subjective:   Henry Black is a 65 y.o. male patient admitted to [facility] via IVC. Per er nurse, pt comes under IVC with BPD after being found on top of a neighbor's roof and laying in random yards. Pt lives at a group home. Pt talking in a word salad. Calm, cooperative.   HPI:  Assessment  Henry Black, 65 y.o., male patient presented to [facility].  Patient seen via telepsych by TTS and this provider; chart reviewed and consulted with Dr. Toni Amend on 11/26/20.  On evaluation Henry Black is observed in the hall making random noises while writer is assessing another patient. Per TTS, Henry Black is a 65 year old male who presents to the ER via law enforcement after someone called them. Patient was found on the roof of someone's house and they were fearful he was going to hurt his self. It was also difficult to understand what he was saying. He's speech was pressured and word salad. Per the patient, he was with "some young cats (young adult males)," and they dared him to go on the roof and jump, for an exchange for a "white toy car." As he continued to described the "white toy car" it was unclear if it was cocaine or a toy car. When asked, patient laughed and stated it was "a real car." However, per his description, the young men who challenged him to go on the roof are selling drugs and they have asked him to do things in the past for substances. 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  Per the group home, [facility] Leta Jungling [phone]), the patient has been with them for approximately a year and a half and his current behaviors are not  like him. They noticed things changed within the last two weeks and the last two days have been the worse. He hasn't been sleeping, leaving the home late at night. He told her he was using drugs and she believes his current behaviors are because of it. As well as his history of schizophrenia. He's taking his medications but the drugs are not helping with his symptoms. Prior to the last two weeks, the impulsivity and things he is doing he wouldn't consider doing. However, he is doing more high-risk things, such as today when he was on the roof of the house.  During the interview the patient was cooperative and pleasant. At times it was difficult to understand what he was saying due to having pressured speech and talking fast.  Recommendations: Psychiatric inpatient hospitation   Past Psychiatric History: schizoaffective disorder  Risk to Self:  yes  Risk to Others:  no Prior Inpatient Therapy:   Prior Outpatient Therapy:    Past Medical History:  Past Medical History:  Diagnosis Date  . Hepatitis   . Hypertension     Past Surgical History:  Procedure Laterality Date  . ESOPHAGOGASTRODUODENOSCOPY (EGD) WITH PROPOFOL N/A 12/21/2019   Procedure: ESOPHAGOGASTRODUODENOSCOPY (EGD) WITH PROPOFOL;  Surgeon: Toney Reil, MD;  Location: [facility] ENDOSCOPY;  Service: Gastroenterology;  Laterality: N/A;   Family History: History reviewed. No pertinent family history. Family Psychiatric  History: unknown Social History:  Social History   Substance and Sexual Activity  Alcohol Use Yes  .  Alcohol/week: 1.0 standard drink  . Types: 1 Standard drinks or equivalent per week     Social History   Substance and Sexual Activity  Drug Use Not Currently    Social History   Socioeconomic History  . Marital status: Single    Spouse name: Not on file  . Number of children: Not on file  . Years of education: Not on file  . Highest education level: Not on file  Occupational History  . Not on  file  Tobacco Use  . Smoking status: Current Every Day Smoker    Packs/day: 0.50    Years: 46.00    Pack years: 23.00    Types: Cigarettes  . Smokeless tobacco: Never Used  Vaping Use  . Vaping Use: Never used  Substance and Sexual Activity  . Alcohol use: Yes    Alcohol/week: 1.0 standard drink    Types: 1 Standard drinks or equivalent per week  . Drug use: Not Currently  . Sexual activity: Not on file  Other Topics Concern  . Not on file  Social History Narrative  . Not on file   Social Determinants of Health   Financial Resource Strain: Not on file  Food Insecurity: Not on file  Transportation Needs: Not on file  Physical Activity: Not on file  Stress: Not on file  Social Connections: Not on file   Additional Social History:    Allergies:   Allergies  Allergen Reactions  . Penicillins Rash    Has patient had a PCN reaction causing immediate rash, facial/tongue/throat swelling, SOB or lightheadedness with hypotension: Unknown Has patient had a PCN reaction causing severe rash involving mucus membranes or skin necrosis: Unknown Has patient had a PCN reaction that required hospitalization: Unknown Has patient had a PCN reaction occurring within the last 10 years: Unknown If all of the above answers are "NO", then may proceed with Cephalosporin use.     Labs:  Results for orders placed or performed during the hospital encounter of 11/25/20 (from the past 48 hour(s))  Comprehensive metabolic panel     Status: Abnormal   Collection Time: 11/25/20  2:40 PM  Result Value Ref Range   Sodium 139 135 - 145 mmol/L   Potassium 4.2 3.5 - 5.1 mmol/L   Chloride 108 98 - 111 mmol/L   CO2 24 22 - 32 mmol/L   Glucose, Bld 141 (H) 70 - 99 mg/dL    Comment: Glucose reference range applies only to samples taken after fasting for at least 8 hours.   BUN 26 (H) 8 - 23 mg/dL   Creatinine, Ser 4.54 0.61 - 1.24 mg/dL   Calcium 9.5 8.9 - 09.8 mg/dL   Total Protein 6.6 6.5 - 8.1 g/dL    Albumin 4.2 3.5 - 5.0 g/dL   AST 23 15 - 41 U/L   ALT 16 0 - 44 U/L   Alkaline Phosphatase 68 38 - 126 U/L   Total Bilirubin 0.6 0.3 - 1.2 mg/dL   GFR, Estimated >11 >91 mL/min    Comment: (NOTE) Calculated using the CKD-EPI Creatinine Equation (2021)    Anion gap 7 5 - 15    Comment: Performed at [facility], [address] [place], [place] [address]  Ethanol     Status: None   Collection Time: 11/25/20  2:40 PM  Result Value Ref Range   Alcohol, Ethyl (B) <10 <10 mg/dL    Comment: (NOTE) Lowest detectable limit for serum alcohol is 10 mg/dL.  For medical purposes only. Performed at [facility], [address] [place], [place] [address]   cbc     Status: Abnormal   Collection Time: 11/25/20  2:40 PM  Result Value Ref Range   WBC 7.2 4.0 - 10.5 K/uL   RBC 4.13 (L) 4.22 - 5.81 MIL/uL   Hemoglobin 12.1 (L) 13.0 - 17.0 g/dL   HCT 60.435.9 (L) 54.039.0 - 98.152.0 %   MCV 86.9 80.0 - 100.0 fL   MCH 29.3 26.0 - 34.0 pg   MCHC 33.7 30.0 - 36.0 g/dL   RDW 19.113.9 47.811.5 - 29.515.5 %   Platelets 173 150 - 400 K/uL   nRBC 0.0 0.0 - 0.2 %    Comment: Performed at [facility], [address] [place], [place] [address]  Resp Panel by RT-PCR (Flu A&B, Covid) Nasopharyngeal Swab     Status: None   Collection Time: 11/25/20  4:40 PM   Specimen: Nasopharyngeal Swab; Nasopharyngeal(NP) swabs in vial transport medium  Result Value Ref Range   SARS Coronavirus 2 by RT PCR NEGATIVE NEGATIVE    Comment: (NOTE) SARS-CoV-2 target nucleic acids are NOT DETECTED.  The SARS-CoV-2 RNA is generally detectable in upper respiratory specimens during the acute phase of infection. The lowest concentration of SARS-CoV-2 viral copies this assay can detect is 138 copies/mL. A negative result does not preclude SARS-Cov-2 infection and should not be used as the sole basis for treatment or other patient management decisions. A negative result may occur with  improper specimen  collection/handling, submission of specimen other than nasopharyngeal swab, presence of viral mutation(s) within the areas targeted by this assay, and inadequate number of viral copies(<138 copies/mL). A negative result must be combined with clinical observations, patient history, and epidemiological information. The expected result is Negative.  Fact Sheet for Patients:  BloggerCourse.comhttps://www.fda.gov/media/152166/download  Fact Sheet for Healthcare Providers:  SeriousBroker.ithttps://www.fda.gov/media/152162/download  This test is no t yet approved or cleared by the [place] FDA and  has been authorized for detection and/or diagnosis of SARS-CoV-2 by FDA under an Emergency Use Authorization (EUA). This EUA will remain  in effect (meaning this test can be used) for the duration of the COVID-19 declaration under Section 564(b)(1) of the Act, 21 U.S.C.section 360bbb-3(b)(1), unless the authorization is terminated  or revoked sooner.       Influenza A by PCR NEGATIVE NEGATIVE   Influenza B by PCR NEGATIVE NEGATIVE    Comment: (NOTE) The Xpert Xpress SARS-CoV-2/FLU/RSV plus assay is intended as an aid in the diagnosis of influenza from Nasopharyngeal swab specimens and should not be used as a sole basis for treatment. Nasal washings and aspirates are unacceptable for Xpert Xpress SARS-CoV-2/FLU/RSV testing.  Fact Sheet for Patients: BloggerCourse.comhttps://www.fda.gov/media/152166/download  Fact Sheet for Healthcare Providers: SeriousBroker.ithttps://www.fda.gov/media/152162/download  This test is not yet approved or cleared by the [place] FDA and has been authorized for detection and/or diagnosis of SARS-CoV-2 by FDA under an Emergency Use Authorization (EUA). This EUA will remain in effect (meaning this test can be used) for the duration of the COVID-19 declaration under Section 564(b)(1) of the Act, 21 U.S.C. section 360bbb-3(b)(1), unless the authorization is terminated or revoked.  Performed at [facility], [address] [place], [place] [address]     No current facility-administered medications for this encounter.   Current Outpatient Medications  Medication Sig Dispense Refill  . atorvastatin (LIPITOR) 20 MG tablet Take 20 mg by mouth daily.    . benztropine (COGENTIN) 2 MG  tablet Take 2 mg by mouth 2 (two) times daily.    . Cholecalciferol 2000 units CAPS Take 2,000 Units by mouth daily.    . clonazePAM (KLONOPIN) 1 MG tablet Take 1 mg by mouth 2 (two) times daily.    . haloperidol decanoate (HALDOL DECANOATE) 100 MG/ML injection Inject 150 mg into the muscle every 28 (twenty-eight) days.    Marland Kitchen losartan (COZAAR) 100 MG tablet Take 100 mg by mouth daily.    . LUCEMYRA 0.18 MG TABS     . Sofosbuvir-Velpatasvir (EPCLUSA) 400-100 MG TABS Take 1 tablet by mouth daily. 28 tablet 2    Musculoskeletal: Strength & Muscle Tone: within normal limits Gait & Station: normal Patient leans: N/A  Psychiatric Specialty Exam:  Presentation  General Appearance: Bizarre; Disheveled  Eye Contact:Fleeting  Speech:Garbled; Pressured; Slurred  Speech Volume:Increased  Handedness:Right   Mood and Affect  Mood:-- (unable to assess)  Affect:Inappropriate; Full Range   Thought Process  Thought Processes:Disorganized  Descriptions of Associations:Tangential  Orientation:Other (comment) (unable to assess)  Thought Content:Illogical  History of Schizophrenia/Schizoaffective disorder:Yes  Duration of Psychotic Symptoms:Greater than six months  Hallucinations:Hallucinations: Other (comment) (unable to assess)  Ideas of Reference:-- (unable to assess)  Suicidal Thoughts:Suicidal Thoughts: -- (unable to assess)  Homicidal Thoughts:No data recorded  Sensorium  Memory:No data recorded Judgment:Impaired  Insight:None   Executive Functions  Concentration:Poor  Attention Span:Poor  Recall:Poor  Fund of Knowledge:Poor  Language:Poor   Psychomotor Activity   Psychomotor Activity:Psychomotor Activity: Restlessness; Tremor   Assets  Assets:Other (comment)   Sleep  Sleep:Sleep: Poor   Physical Exam: Physical Exam Vitals and nursing note reviewed.  HENT:     Head: Normocephalic and atraumatic.     Nose: Nose normal.     Mouth/Throat:     Mouth: Mucous membranes are moist.  Eyes:     Pupils: Pupils are equal, round, and reactive to light.  Pulmonary:     Effort: Pulmonary effort is normal.  Musculoskeletal:        General: Normal range of motion.     Cervical back: Normal range of motion.  Skin:    General: Skin is warm and dry.  Neurological:     General: No focal deficit present.     Mental Status: He is oriented to person, place, and time. Mental status is at baseline.  Psychiatric:        Attention and Perception: He is inattentive.        Mood and Affect: Mood is elated. Affect is inappropriate.        Speech: Speech is tangential.        Behavior: Behavior is uncooperative and hyperactive.        Thought Content: Thought content is delusional.        Cognition and Memory: Cognition is impaired. Memory is impaired.        Judgment: Judgment is impulsive and inappropriate.    Review of Systems  Psychiatric/Behavioral: Positive for substance abuse.  All other systems reviewed and are negative.  Blood pressure 120/73, pulse 89, temperature 98.7 F (37.1 C), temperature source Oral, resp. rate 18, height 6\' 1"  (1.854 m), weight 68 kg, SpO2 98 %. Body mass index is 19.78 kg/m.  Treatment Plan Summary: Daily contact with patient to assess and evaluate symptoms and progress in treatment  Disposition: Recommend psychiatric Inpatient admission when medically cleared. Supportive therapy provided about ongoing stressors. Discussed crisis plan, support from social network, calling 911, coming to the Emergency Department, and  calling Suicide Hotline.  Jearld Lesch, NP 11/26/2020 3:19 AM

## 2020-11-27 DIAGNOSIS — F203 Undifferentiated schizophrenia: Principal | ICD-10-CM

## 2020-11-27 DIAGNOSIS — I10 Essential (primary) hypertension: Secondary | ICD-10-CM

## 2020-11-27 LAB — LIPID PANEL
Cholesterol: 140 mg/dL (ref 0–200)
HDL: 61 mg/dL (ref >40)
LDL Cholesterol: 73 mg/dL (ref 0–99)
Total CHOL/HDL Ratio: 2.3 RATIO
Triglycerides: 30 mg/dL (ref <150)
VLDL: 6 mg/dL (ref 0–40)

## 2020-11-27 LAB — URINE DRUG SCREEN, QUALITATIVE (ARMC ONLY)
Amphetamines, Ur Screen: NOT DETECTED
Barbiturates, Ur Screen: NOT DETECTED
Benzodiazepine, Ur Scrn: POSITIVE — AB
Cannabinoid 50 Ng, Ur ~~LOC~~: NOT DETECTED
Cocaine Metabolite,Ur ~~LOC~~: NOT DETECTED
MDMA (Ecstasy)Ur Screen: NOT DETECTED
Methadone Scn, Ur: NOT DETECTED
Opiate, Ur Screen: NOT DETECTED
Phencyclidine (PCP) Ur S: NOT DETECTED
Tricyclic, Ur Screen: NOT DETECTED

## 2020-11-27 LAB — TSH: TSH: 2.051 u[IU]/mL (ref 0.350–4.500)

## 2020-11-27 LAB — HEMOGLOBIN A1C
Hgb A1c MFr Bld: 5.4 % (ref 4.8–5.6)
Mean Plasma Glucose: 108.28 mg/dL

## 2020-11-27 MED ORDER — NICOTINE 21 MG/24HR TD PT24
21.0000 mg | MEDICATED_PATCH | Freq: Every day | TRANSDERMAL | Status: DC
  Administered 2020-11-27 – 2020-12-12 (×16): 21 mg via TRANSDERMAL
  Filled 2020-11-27 (×16): qty 1

## 2020-11-27 MED ORDER — TEMAZEPAM 15 MG PO CAPS
15.0000 mg | ORAL_CAPSULE | Freq: Every day | ORAL | Status: DC
  Administered 2020-11-27 – 2020-12-09 (×13): 15 mg via ORAL
  Filled 2020-11-27 (×14): qty 1

## 2020-11-27 MED ORDER — MENTHOL 3 MG MT LOZG
1.0000 | LOZENGE | OROMUCOSAL | Status: DC | PRN
  Filled 2020-11-27: qty 9

## 2020-11-27 MED ORDER — HALOPERIDOL 1 MG PO TABS
2.0000 mg | ORAL_TABLET | Freq: Four times a day (QID) | ORAL | Status: DC | PRN
  Administered 2020-12-03: 2 mg via ORAL
  Filled 2020-11-27: qty 2

## 2020-11-27 NOTE — [facility] Counselor (Addendum)
CSW attempted to meet with pt to complete assessment. Pt informed CSW that his throat was hurting. He was difficult to understand outside of this other than he mentioned icecream. CSW informed pt that his nurse would be notified of his sore throat. Nurse was notified. When asked what brought him to the hospital, pt began speaking about playing basketball, issues with his feet (after a crab pinched them in the street), and [place]. His ideas are very disorganized and he was unable to answer the questions in the assessment. Pt did let CSW know that he was staying in a group home prior to admission. Pt gave verbal permission to contact his mother and the group home, however, his signature was not legible. CSW will attempt to meet with pt at a later time/date to complete assessment and confirm that he would like CSW to speak with his mother and group home. CSW guided pt to outside area for group since he had been asking other staff members to go outside. Pt stated that he wanted a cigarette outside. CSW informed him that this is a no smoking facility but that he could go outside to the courtyard. No other concerns expressed. Contact ended without incident.  Vilma Meckel. Algis Greenhouse, MSW, LCSW, LCAS 11/27/2020 10:07 AM

## 2020-11-27 NOTE — Progress Notes (Signed)
Recreation Therapy Notes   Date: 11/27/2020  Time: 9:30 am   Location: Courtyard  Behavioral response: N/A   Intervention Topic: Social skills    Discussion/Intervention: Patient did not attend group.   Clinical Observations/Feedback:  Patient did not attend group.   Lahari Suttles LRT/CTRS         Leana Springston 11/27/2020 10:58 AM

## 2020-11-27 NOTE — Plan of Care (Signed)
  Problem: Education: Goal: Mental status will improve Outcome: Not Progressing Goal: Verbalization of understanding the information provided will improve Outcome: Not Progressing   Problem: Safety: Goal: Periods of time without injury will increase Outcome: Progressing   Problem: Activity: Goal: Will verbalize the importance of balancing activity with adequate rest periods Outcome: Not Progressing

## 2020-11-27 NOTE — Progress Notes (Signed)
[facility] MD Progress Note  11/27/2020 4:54 PM Henry Black  MRN:  [id number] Subjective: Follow-up for this 65 year old man with schizophrenia.  Patient seen and chart reviewed.  Case reviewed with treatment team.  Patient did not sleep well last night.  He has been hyperactive during the day today.  Pacing around the ward a lot.  Posturing at times.  Has not been threatening to anyone but can still be loud.  In interview I found it very difficult to understand him.  He did indicate that he is still feeling like he is not thinking clearly.  Blood pressure is a little bit high.  No new labs today. Principal Problem: Schizophrenia, undifferentiated (HCC) Diagnosis: Principal Problem:   Schizophrenia, undifferentiated (HCC)  Total Time spent with patient: 30 minutes  Past Psychiatric History: Little information available.  I tried to reach his group home several times today without any answer or the ability to leave a voicemail.  I was hoping to learn when he last had his Haldol decanoate shot.  Patient was not able to tell me.  Unknown if he has a history of violence.  Past Medical History:  Past Medical History:  Diagnosis Date  . Hepatitis   . Hypertension     Past Surgical History:  Procedure Laterality Date  . ESOPHAGOGASTRODUODENOSCOPY (EGD) WITH PROPOFOL N/A 12/21/2019   Procedure: ESOPHAGOGASTRODUODENOSCOPY (EGD) WITH PROPOFOL;  Surgeon: Toney Reil, MD;  Location: [facility] ENDOSCOPY;  Service: Gastroenterology;  Laterality: N/A;   Family History: History reviewed. No pertinent family history. Family Psychiatric  History: Unknown. Social History:  Social History   Substance and Sexual Activity  Alcohol Use Yes  . Alcohol/week: 1.0 standard drink  . Types: 1 Standard drinks or equivalent per week     Social History   Substance and Sexual Activity  Drug Use Not Currently    Social History   Socioeconomic History  . Marital status: Single    Spouse name: Not on file  .  Number of children: Not on file  . Years of education: Not on file  . Highest education level: Not on file  Occupational History  . Not on file  Tobacco Use  . Smoking status: Current Every Day Smoker    Packs/day: 0.50    Years: 46.00    Pack years: 23.00    Types: Cigarettes  . Smokeless tobacco: Never Used  Vaping Use  . Vaping Use: Never used  Substance and Sexual Activity  . Alcohol use: Yes    Alcohol/week: 1.0 standard drink    Types: 1 Standard drinks or equivalent per week  . Drug use: Not Currently  . Sexual activity: Not on file  Other Topics Concern  . Not on file  Social History Narrative  . Not on file   Social Determinants of Health   Financial Resource Strain: Not on file  Food Insecurity: Not on file  Transportation Needs: Not on file  Physical Activity: Not on file  Stress: Not on file  Social Connections: Not on file   Additional Social History:                         Sleep: Poor  Appetite:  Poor  Current Medications: Current Facility-Administered Medications  Medication Dose Route Frequency Provider Last Rate Last Admin  . acetaminophen (TYLENOL) tablet 650 mg  650 mg Oral Q6H PRN Laderius Valbuena, [name], MD   650 mg at 11/27/20 0253  . alum & mag  hydroxide-simeth (MAALOX/MYLANTA) 200-200-20 MG/5ML suspension 30 mL  30 mL Oral Q4H PRN Kylea Berrong T, MD      . atorvastatin (LIPITOR) tablet 20 mg  20 mg Oral Daily Gianfranco Araki, Jackquline DenmarkJohn T, MD   20 mg at 11/27/20 0816  . benztropine (COGENTIN) tablet 1 mg  1 mg Oral BID Cherie Lasalle, Jackquline DenmarkJohn T, MD   1 mg at 11/27/20 1629  . clonazePAM (KLONOPIN) tablet 1 mg  1 mg Oral BID Baylynn Shifflett T, MD   1 mg at 11/27/20 1629  . haloperidol (HALDOL) tablet 2 mg  2 mg Oral Q6H PRN Rokhaya Quinn T, MD      . haloperidol (HALDOL) tablet 5 mg  5 mg Oral BID Anay Walter T, MD   5 mg at 11/27/20 1629  . hydrOXYzine (ATARAX/VISTARIL) tablet 50 mg  50 mg Oral TID PRN Numan Zylstra, Jackquline DenmarkJohn T, MD      . LORazepam (ATIVAN) tablet 2 mg  2  mg Oral Q6H PRN Cheyanna Strick, Jackquline DenmarkJohn T, MD   2 mg at 11/27/20 1041   Or  . LORazepam (ATIVAN) injection 2 mg  2 mg Intramuscular Q6H PRN Arleth Mccullar T, MD      . losartan (COZAAR) tablet 100 mg  100 mg Oral Daily Giovan Pinsky, Jackquline DenmarkJohn T, MD   100 mg at 11/27/20 0816  . magnesium hydroxide (MILK OF MAGNESIA) suspension 30 mL  30 mL Oral Daily PRN Kerisha Goughnour T, MD      . menthol-cetylpyridinium (CEPACOL) lozenge 3 mg  1 lozenge Oral PRN Rakeya Glab T, MD      . nicotine (NICODERM CQ - dosed in mg/24 hours) patch 21 mg  21 mg Transdermal Daily Granvel Proudfoot, Jackquline DenmarkJohn T, MD        Lab Results:  Results for orders placed or performed during the hospital encounter of 11/26/20 (from the past 48 hour(s))  Hemoglobin A1c     Status: None   Collection Time: 11/27/20  7:11 AM  Result Value Ref Range   Hgb A1c MFr Bld 5.4 4.8 - 5.6 %    Comment: (NOTE) Pre diabetes:          5.7%-6.4%  Diabetes:              >6.4%  Glycemic control for   <7.0% adults with diabetes    Mean Plasma Glucose 108.28 mg/dL    Comment: Performed at [facility], [address] [place], [place] [address]  Lipid panel     Status: None   Collection Time: 11/27/20  7:11 AM  Result Value Ref Range   Cholesterol 140 0 - 200 mg/dL   Triglycerides 30 <213<150 mg/dL   HDL 61 >08>40 mg/dL   Total CHOL/HDL Ratio 2.3 RATIO   VLDL 6 0 - 40 mg/dL   LDL Cholesterol 73 0 - 99 mg/dL    Comment:        Total Cholesterol/HDL:CHD Risk Coronary Heart Disease Risk Table                     Men   Women  1/2 Average Risk   3.4   3.3  Average Risk       5.0   4.4  2 X Average Risk   9.6   7.1  3 X Average Risk  23.4   11.0        Use the calculated Patient Ratio above and the CHD Risk Table to determine the patient's CHD Risk.  ATP III CLASSIFICATION (LDL):  <100     mg/dL   Optimal  025-427  mg/dL   Near or Above                    Optimal  130-159  mg/dL   Borderline  062-376  mg/dL   High  >283     mg/dL   Very High Performed at  [facility], [address] [place], [place] [address]   TSH     Status: None   Collection Time: 11/27/20  7:11 AM  Result Value Ref Range   TSH 2.051 0.350 - 4.500 uIU/mL    Comment: Performed by a 3rd Generation assay with a functional sensitivity of <=0.01 uIU/mL. Performed at [facility], [address], [place], [place] [address]   Urine Drug Screen, Qual[facility] only)     Status: Abnormal   Collection Time: 11/27/20 11:45 AM  Result Value Ref Range   Tricyclic, Ur Screen NONE DETECTED NONE DETECTED   Amphetamines, Ur Screen NONE DETECTED NONE DETECTED   MDMA (Ecstasy)Ur Screen NONE DETECTED NONE DETECTED   Cocaine Metabolite,Ur [place] NONE DETECTED NONE DETECTED   Opiate, Ur Screen NONE DETECTED NONE DETECTED   Phencyclidine (PCP) Ur S NONE DETECTED NONE DETECTED   Cannabinoid 50 Ng, Ur [place] NONE DETECTED NONE DETECTED   Barbiturates, Ur Screen NONE DETECTED NONE DETECTED   Benzodiazepine, Ur Scrn POSITIVE (A) NONE DETECTED   Methadone Scn, Ur NONE DETECTED NONE DETECTED    Comment: (NOTE) Tricyclics + metabolites, urine    Cutoff 1000 ng/mL Amphetamines + metabolites, urine  Cutoff 1000 ng/mL MDMA (Ecstasy), urine              Cutoff 500 ng/mL Cocaine Metabolite, urine          Cutoff 300 ng/mL Opiate + metabolites, urine        Cutoff 300 ng/mL Phencyclidine (PCP), urine         Cutoff 25 ng/mL Cannabinoid, urine                 Cutoff 50 ng/mL Barbiturates + metabolites, urine  Cutoff 200 ng/mL Benzodiazepine, urine              Cutoff 200 ng/mL Methadone, urine                   Cutoff 300 ng/mL  The urine drug screen provides only a preliminary, unconfirmed analytical test result and should not be used for non-medical purposes. Clinical consideration and professional judgment should be applied to any positive drug screen result due to possible interfering substances. A more specific alternate chemical method must be used in order to obtain a  confirmed analytical result. Gas chromatography / mass spectrometry (GC/MS) is the preferred confirm atory method. Performed at [facility], [address] [place], [place] [address]     Blood Alcohol level:  Lab Results  Component Value Date   [facility] <10 11/25/2020   ETH <10 09/16/2018    Metabolic Disorder Labs: Lab Results  Component Value Date   HGBA1C 5.4 11/27/2020   MPG 108.28 11/27/2020   No results found for: PROLACTIN Lab Results  Component Value Date   CHOL 140 11/27/2020   TRIG 30 11/27/2020   HDL 61 11/27/2020   CHOLHDL 2.3 11/27/2020   VLDL 6 11/27/2020   LDLCALC 73 11/27/2020    Physical Findings: AIMS:  , ,  ,  ,  CIWA:    COWS:     Musculoskeletal: Strength & Muscle Tone: within normal limits Gait & Station: unsteady Patient leans: N/A  Psychiatric Specialty Exam:  Presentation  General Appearance: Bizarre; Disheveled  Eye Contact:Fleeting  Speech:Garbled; Pressured; Slurred  Speech Volume:Increased  Handedness:Right   Mood and Affect  Mood:-- (unable to assess)  Affect:Inappropriate; Full Range   Thought Process  Thought Processes:Disorganized  Descriptions of Associations:Tangential  Orientation:Other (comment) (unable to assess)  Thought Content:Illogical  History of Schizophrenia/Schizoaffective disorder:Yes  Duration of Psychotic Symptoms:Greater than six months  Hallucinations:Hallucinations: Other (comment) (unable to assess)  Ideas of Reference:-- (unable to assess)  Suicidal Thoughts:Suicidal Thoughts: -- (unable to assess)  Homicidal Thoughts:No data recorded  Sensorium  Memory:No data recorded Judgment:Impaired  Insight:None   Executive Functions  Concentration:Poor  Attention Span:Poor  Recall:Poor  Fund of Knowledge:Poor  Language:Poor   Psychomotor Activity  Psychomotor Activity:Psychomotor Activity: Restlessness; Tremor   Assets  Assets:Other (comment)   Sleep   Sleep:Sleep: Poor    Physical Exam: Physical Exam Vitals and nursing note reviewed.  Constitutional:      Appearance: Normal appearance.  HENT:     Head: Normocephalic and atraumatic.     Mouth/Throat:     Pharynx: Oropharynx is clear.  Eyes:     Pupils: Pupils are equal, round, and reactive to light.  Cardiovascular:     Rate and Rhythm: Normal rate and regular rhythm.  Pulmonary:     Effort: Pulmonary effort is normal.     Breath sounds: Normal breath sounds.  Abdominal:     General: Abdomen is flat.     Palpations: Abdomen is soft.  Musculoskeletal:        General: Normal range of motion.  Skin:    General: Skin is warm and dry.  Neurological:     General: No focal deficit present.     Mental Status: He is alert. Mental status is at baseline.  Psychiatric:        Attention and Perception: He is inattentive.        Mood and Affect: Mood normal. Affect is labile and inappropriate.        Speech: Speech is tangential.        Behavior: Behavior is agitated.        Thought Content: Thought content is delusional.        Cognition and Memory: Cognition is impaired. Memory is impaired.        Judgment: Judgment is impulsive.    Review of Systems  Constitutional: Negative.   HENT: Negative.   Eyes: Negative.   Respiratory: Negative.   Cardiovascular: Negative.   Gastrointestinal: Negative.   Musculoskeletal: Negative.   Skin: Negative.   Neurological: Negative.   Psychiatric/Behavioral: The patient is nervous/anxious.    Blood pressure (!) 151/97, pulse 75, temperature 97.9 F (36.6 C), temperature source Oral, resp. rate 16, height 6\' 1"  (1.854 m), weight 68.9 kg, SpO2 100 %. Body mass index is 20.05 kg/m.   Treatment Plan Summary: Daily contact with patient to assess and evaluate symptoms and progress in treatment, Medication management and Plan Offered supportive counseling in so far as it was possible with the patient.  Reviewed medication.  Added some as  needed medicine including Haldol on top of the Ativan for some periods of agitation.  Added a nicotine patch.  Patient did not sleep well last night and I will try adding something a little stronger to assist with sleep tonight.  Seems to be a  little bit better to me than when he first came in.  Reordered urine drug screen as it would really be helpful to know if he had been using cocaine.  No result yet.  Mordecai Rasmussen, MD 11/27/2020, 4:54 PM

## 2020-11-27 NOTE — H&P (Signed)
Psychiatric Admission Assessment Adult  Patient Identification: Henry Black MRN:  [id number] Date of Evaluation:  11/27/2020 Chief Complaint:  Schizophrenia, undifferentiated (HCC) [F20.3] Principal Diagnosis: Schizophrenia, undifferentiated (HCC) Diagnosis:  Principal Problem:   Schizophrenia, undifferentiated (HCC) Active Problems:   Chronic hepatitis C (HCC)   Essential hypertension  History of Present Illness: Patient seen and chart reviewed.  Patient was sent in from his group home with reports of dramatic worsening of his behavior.  Reports of agitation and dangerous behavior such as climbing up on roofs.  It was reported that this is abnormal for him and was a big change over the last several days.  There was some speculation that he might have been using cocaine or other drugs.  Patient has been psychotic and difficult to interview since coming into the hospital.  His speech is slurred and garbled and much of it seems nonsensical.  He has not made any suicidal statements and not been aggressive to any people but is often agitated in his behavior with dancing and stereotyped behavior.  Patient could not give a clear set of information about his recent behavior.  He is apparently on haloperidol decanoate as his main medication but we do not know how long ago he last had a shot Associated Signs/Symptoms: Depression Symptoms:  insomnia, impaired memory, Duration of Depression Symptoms: No data recorded (Hypo) Manic Symptoms:  Distractibility, Flight of Ideas, Impulsivity, Anxiety Symptoms:  Nonspecific Psychotic Symptoms:  Delusions, PTSD Symptoms: Negative Total Time spent with patient: 1 hour  Past Psychiatric History: Patient appears to have chronic schizophrenia.  He has signs of tardive dyskinesia and notes from old visits for medical needs that he was suffering from schizophrenia and treated with Haldol at that time.  Probable prior hospitalizations but the patient's not able to  give any information we do not have collateral information.  Unclear how suicidal or dangerous he has been in the past.  It was reported that he has a history of at least occasional use of cocaine and other drugs but the drug screen when it was ultimately obtained was negative  Is the patient at risk to self? No.  Has the patient been a risk to self in the past 6 months? No.  Has the patient been a risk to self within the distant past? No.  Is the patient a risk to others? Yes.    Has the patient been a risk to others in the past 6 months? Yes.    Has the patient been a risk to others within the distant past? No.   Prior Inpatient Therapy:   Prior Outpatient Therapy:    Alcohol Screening: 1. How often do you have a drink containing alcohol?: Never 2. How many drinks containing alcohol do you have on a typical day when you are drinking?: 1 or 2 3. How often do you have six or more drinks on one occasion?: Never AUDIT-C Score: 0 4. How often during the last year have you found that you were not able to stop drinking once you had started?: Never 5. How often during the last year have you failed to do what was normally expected from you because of drinking?: Never 6. How often during the last year have you needed a first drink in the morning to get yourself going after a heavy drinking session?: Never 7. How often during the last year have you had a feeling of guilt of remorse after drinking?: Never 8. How often during the last year have  you been unable to remember what happened the night before because you had been drinking?: Never 9. Have you or someone else been injured as a result of your drinking?: No 10. Has a relative or friend or a doctor or another health worker been concerned about your drinking or suggested you cut down?: No Alcohol Use Disorder Identification Test Final Score (AUDIT): 0 Substance Abuse History in the last 12 months:  Yes.   Consequences of Substance Abuse: Unclear.   People at his group home seem to think there is substance abuse involved although the patient is unable to give clear history Previous Psychotropic Medications: Yes  Psychological Evaluations: Yes  Past Medical History:  Past Medical History:  Diagnosis Date  . Hepatitis   . Hypertension     Past Surgical History:  Procedure Laterality Date  . ESOPHAGOGASTRODUODENOSCOPY (EGD) WITH PROPOFOL N/A 12/21/2019   Procedure: ESOPHAGOGASTRODUODENOSCOPY (EGD) WITH PROPOFOL;  Surgeon: Toney ReilVanga, Rohini Reddy, MD;  Location: [facility] END[facility];  Service: Gastroenterology;  Laterality: N/A;   Family History: History reviewed. No pertinent family history. Family Psychiatric  History: Unknown Tobacco Screening: Have you used any form of tobacco in the last 30 days? (Cigarettes, Smokeless Tobacco, Cigars, and/or Pipes): Yes Tobacco use, Select all that apply: 5 or more cigarettes per day Are you interested in Tobacco Cessation Medications?: No, patient refused Counseled patient on smoking cessation including recognizing danger situations, developing coping skills and basic information about quitting provided: Refused/Declined practical counseling Social History:  Social History   Substance and Sexual Activity  Alcohol Use Yes  . Alcohol/week: 1.0 standard drink  . Types: 1 Standard drinks or equivalent per week     Social History   Substance and Sexual Activity  Drug Use Not Currently    Additional Social History:                           Allergies:   Allergies  Allergen Reactions  . Penicillins Rash    Has patient had a PCN reaction causing immediate rash, facial/tongue/throat swelling, SOB or lightheadedness with hypotension: Unknown Has patient had a PCN reaction causing severe rash involving mucus membranes or skin necrosis: Unknown Has patient had a PCN reaction that required hospitalization: Unknown Has patient had a PCN reaction occurring within the last 10 years: Unknown If all  of the above answers are "NO", then may proceed with Cephalosporin use.    Lab Results:  Results for orders placed or performed during the hospital encounter of 11/26/20 (from the past 48 hour(s))  Hemoglobin A1c     Status: None   Collection Time: 11/27/20  7:11 AM  Result Value Ref Range   Hgb A1c MFr Bld 5.4 4.8 - 5.6 %    Comment: (NOTE) Pre diabetes:          5.7%-6.4%  Diabetes:              >6.4%  Glycemic control for   <7.0% adults with diabetes    Mean Plasma Glucose 108.28 mg/dL    Comment: Performed at [facility], [address] [place], [place] [address]  Lipid panel     Status: None   Collection Time: 11/27/20  7:11 AM  Result Value Ref Range   Cholesterol 140 0 - 200 mg/dL   Triglycerides 30 <604<150 mg/dL   HDL 61 >54>40 mg/dL   Total CHOL/HDL Ratio 2.3 RATIO   VLDL 6 0 - 40 mg/dL  LDL Cholesterol 73 0 - 99 mg/dL    Comment:        Total Cholesterol/HDL:CHD Risk Coronary Heart Disease Risk Table                     Men   Women  1/2 Average Risk   3.4   3.3  Average Risk       5.0   4.4  2 X Average Risk   9.6   7.1  3 X Average Risk  23.4   11.0        Use the calculated Patient Ratio above and the CHD Risk Table to determine the patient's CHD Risk.        ATP III CLASSIFICATION (LDL):  <100     mg/dL   Optimal  161-096  mg/dL   Near or Above                    Optimal  130-159  mg/dL   Borderline  045-409  mg/dL   High  >811     mg/dL   Very High Performed at [facility], [address] [place], [place] [address]   TSH     Status: None   Collection Time: 11/27/20  7:11 AM  Result Value Ref Range   TSH 2.051 0.350 - 4.500 uIU/mL    Comment: Performed by a 3rd Generation assay with a functional sensitivity of <=0.01 uIU/mL. Performed at [facility], [address], [place], [place] [address]   Urine Drug Screen, Qualitative [facility] only)     Status: Abnormal   Collection Time: 11/27/20 11:45 AM  Result Value Ref Range    Tricyclic, Ur Screen NONE DETECTED NONE DETECTED   Amphetamines, Ur Screen NONE DETECTED NONE DETECTED   MDMA (Ecstasy)Ur Screen NONE DETECTED NONE DETECTED   Cocaine Metabolite,Ur [place] NONE DETECTED NONE DETECTED   Opiate, Ur Screen NONE DETECTED NONE DETECTED   Phencyclidine (PCP) Ur S NONE DETECTED NONE DETECTED   Cannabinoid 50 Ng, Ur [place] NONE DETECTED NONE DETECTED   Barbiturates, Ur Screen NONE DETECTED NONE DETECTED   Benzodiazepine, Ur Scrn POSITIVE (A) NONE DETECTED   Methadone Scn, Ur NONE DETECTED NONE DETECTED    Comment: (NOTE) Tricyclics + metabolites, urine    Cutoff 1000 ng/mL Amphetamines + metabolites, urine  Cutoff 1000 ng/mL MDMA (Ecstasy), urine              Cutoff 500 ng/mL Cocaine Metabolite, urine          Cutoff 300 ng/mL Opiate + metabolites, urine        Cutoff 300 ng/mL Phencyclidine (PCP), urine         Cutoff 25 ng/mL Cannabinoid, urine                 Cutoff 50 ng/mL Barbiturates + metabolites, urine  Cutoff 200 ng/mL Benzodiazepine, urine              Cutoff 200 ng/mL Methadone, urine                   Cutoff 300 ng/mL  The urine drug screen provides only a preliminary, unconfirmed analytical test result and should not be used for non-medical purposes. Clinical consideration and professional judgment should be applied to any positive drug screen result due to possible interfering substances. A more specific alternate chemical method must be used in order to obtain a confirmed analytical result. Gas chromatography / mass  spectrometry (GC/MS) is the preferred confirm atory method. Performed at [facility], [address] [place], [place] [address]     Blood Alcohol level:  Lab Results  Component Value Date   [facility] <10 11/25/2020   ETH <10 09/16/2018    Metabolic Disorder Labs:  Lab Results  Component Value Date   HGBA1C 5.4 11/27/2020   MPG 108.28 11/27/2020   No results found for: PROLACTIN Lab Results  Component Value Date    CHOL 140 11/27/2020   TRIG 30 11/27/2020   HDL 61 11/27/2020   CHOLHDL 2.3 11/27/2020   VLDL 6 11/27/2020   LDLCALC 73 11/27/2020    Current Medications: Current Facility-Administered Medications  Medication Dose Route Frequency Provider Last Rate Last Admin  . acetaminophen (TYLENOL) tablet 650 mg  650 mg Oral Q6H PRN Yarianna Varble, [name], MD   650 mg at 11/27/20 0253  . alum & mag hydroxide-simeth (MAALOX/MYLANTA) 200-200-20 MG/5ML suspension 30 mL  30 mL Oral Q4H PRN Lisa Blakeman T, MD      . atorvastatin (LIPITOR) tablet 20 mg  20 mg Oral Daily Artha Chiasson, [name], MD   20 mg at 11/27/20 0816  . benztropine (COGENTIN) tablet 1 mg  1 mg Oral BID Tyeler Goedken, [name], MD   1 mg at 11/27/20 1629  . clonazePAM (KLONOPIN) tablet 1 mg  1 mg Oral BID Henreitta Spittler T, MD   1 mg at 11/27/20 1629  . haloperidol (HALDOL) tablet 2 mg  2 mg Oral Q6H PRN Tashonna Descoteaux T, MD      . haloperidol (HALDOL) tablet 5 mg  5 mg Oral BID Armon Orvis T, MD   5 mg at 11/27/20 1629  . hydrOXYzine (ATARAX/VISTARIL) tablet 50 mg  50 mg Oral TID PRN Yisell Sprunger, [name], MD      . LORazepam (ATIVAN) tablet 2 mg  2 mg Oral Q6H PRN Luiscarlos Kaczmarczyk, [name], MD   2 mg at 11/27/20 1041   Or  . LORazepam (ATIVAN) injection 2 mg  2 mg Intramuscular Q6H PRN Cachet Mccutchen T, MD      . losartan (COZAAR) tablet 100 mg  100 mg Oral Daily Shyler Hamill, [name], MD   100 mg at 11/27/20 0816  . magnesium hydroxide (MILK OF MAGNESIA) suspension 30 mL  30 mL Oral Daily PRN Arica Bevilacqua T, MD      . menthol-cetylpyridinium (CEPACOL) lozenge 3 mg  1 lozenge Oral PRN Khalea Ventura T, MD      . nicotine (NICODERM CQ - dosed in mg/24 hours) patch 21 mg  21 mg Transdermal Daily Crysta Gulick, [name], MD   21 mg at 11/27/20 1703  . temazepam (RESTORIL) capsule 15 mg  15 mg Oral QHS Jurnee Nakayama T, MD       PTA Medications: Medications Prior to Admission  Medication Sig Dispense Refill Last Dose  . atorvastatin (LIPITOR) 20 MG tablet Take 20 mg by mouth daily.     .  benztropine (COGENTIN) 2 MG tablet Take 2 mg by mouth 2 (two) times daily.     . Cholecalciferol 2000 units CAPS Take 2,000 Units by mouth daily.     . clonazePAM (KLONOPIN) 1 MG tablet Take 1 mg by mouth 2 (two) times daily.     . haloperidol decanoate (HALDOL DECANOATE) 100 MG/ML injection Inject 150 mg into the muscle every 28 (twenty-eight) days.     Marland Kitchen losartan (COZAAR) 100 MG tablet Take 100 mg by mouth daily.     Marland Kitchen  LUCEMYRA 0.18 MG TABS      . Sofosbuvir-Velpatasvir (EPCLUSA) 400-100 MG TABS Take 1 tablet by mouth daily. 28 tablet 2     Musculoskeletal: Strength & Muscle Tone: within normal limits Gait & Station: unsteady Patient leans: N/A            Psychiatric Specialty Exam:  Presentation  General Appearance: Bizarre; Disheveled  Eye Contact:Fleeting  Speech:Garbled; Pressured; Slurred  Speech Volume:Increased  Handedness:Right   Mood and Affect  Mood:-- (unable to assess)  Affect:Inappropriate; Full Range   Thought Process  Thought Processes:Disorganized  Duration of Psychotic Symptoms: Greater than six months  Past Diagnosis of Schizophrenia or Psychoactive disorder: Yes  Descriptions of Associations:Tangential  Orientation:Other (comment) (unable to assess)  Thought Content:Illogical  Hallucinations:Hallucinations: Other (comment) (unable to assess)  Ideas of Reference:-- (unable to assess)  Suicidal Thoughts:Suicidal Thoughts: -- (unable to assess)  Homicidal Thoughts:No data recorded  Sensorium  Memory:No data recorded Judgment:Impaired  Insight:None   Executive Functions  Concentration:Poor  Attention Span:Poor  Recall:Poor  Fund of Knowledge:Poor  Language:Poor   Psychomotor Activity  Psychomotor Activity:Psychomotor Activity: Restlessness; Tremor   Assets  Assets:Other (comment)   Sleep  Sleep:Sleep: Poor    Physical Exam: Physical Exam Vitals and nursing note reviewed.  Constitutional:       Appearance: Normal appearance.  HENT:     Head: Normocephalic and atraumatic.     Mouth/Throat:     Pharynx: Oropharynx is clear.  Eyes:     Pupils: Pupils are equal, round, and reactive to light.  Cardiovascular:     Rate and Rhythm: Normal rate and regular rhythm.  Pulmonary:     Effort: Pulmonary effort is normal.     Breath sounds: Normal breath sounds.  Abdominal:     General: Abdomen is flat.     Palpations: Abdomen is soft.  Musculoskeletal:        General: Normal range of motion.  Skin:    General: Skin is warm and dry.  Neurological:     General: No focal deficit present.     Mental Status: He is alert. Mental status is at baseline.  Psychiatric:        Attention and Perception: He is inattentive.        Mood and Affect: Mood normal. Affect is labile and inappropriate.        Speech: Speech is tangential.        Behavior: Behavior is agitated.        Thought Content: Thought content is delusional.        Cognition and Memory: Cognition is impaired. Memory is impaired.    Review of Systems  Unable to perform ROS: Psychiatric disorder   Blood pressure (!) 151/97, pulse 75, temperature 97.9 F (36.6 C), temperature source Oral, resp. rate 16, height 6\' 1"  (1.854 m), weight 68.9 kg, SpO2 100 %. Body mass index is 20.05 kg/m.  Treatment Plan Summary: Medication management and Plan Patient has been continued on haloperidol.  As needed medicines of Ativan and haloperidol available as well.  Trying to get collateral information to know if he needs his injection.  Patient is going to be included in appropriate groups and activities if he can participate otherwise he will be monitored and we will do everything we can to keep him safe.  Labs were largely unremarkable.  Blood pressure running a little high but getting better with medication.  Patient was able to ask for nicotine replacement today and was given a patch.  Observation Level/Precautions:  15 minute checks   Laboratory:  Chemistry Profile  Psychotherapy:    Medications:    Consultations:    Discharge Concerns:    Estimated LOS:  Other:     Physician Treatment Plan for Primary Diagnosis: Schizophrenia, undifferentiated (HCC) Long Term Goal(s): Improvement in symptoms so as ready for discharge  Short Term Goals: Ability to verbalize feelings will improve and Ability to demonstrate self-control will improve  Physician Treatment Plan for Secondary Diagnosis: Principal Problem:   Schizophrenia, undifferentiated (HCC) Active Problems:   Chronic hepatitis C (HCC)   Essential hypertension  Long Term Goal(s): Improvement in symptoms so as ready for discharge  Short Term Goals: Ability to identify and develop effective coping behaviors will improve, Ability to maintain clinical measurements within normal limits will improve and Compliance with prescribed medications will improve  I certify that inpatient services furnished can reasonably be expected to improve the patient's condition.    Mordecai Rasmussen, MD 5/3/20227:20 PM

## 2020-11-27 NOTE — Progress Notes (Signed)
Patient continues to require frequent re-direction. Noted wandering in and out of other rooms, attempting to open locked doors. Attempting to use others bathrooms. Pt offers minimal conversation, only yes or no questions. Patient given medications for anxiety and sleep. Relief noted thus far. Encouragement and support provided. Safety checks maintained. Medications given as prescribed. Pt remains safe on unit with q 15 min and frequent re-direction.

## 2020-11-27 NOTE — Progress Notes (Signed)
Patient started acting bizarre like left the walker beside and doing dancing steps. Patient talks very disorganized and unclear. Patient states " crazy thoughts ". Ativan PO given. No agitated behaviors noted at this time. Will continue monitoring.

## 2020-11-27 NOTE — [facility] Suicide Risk Assessment (Signed)
[facility] Admission Suicide Risk Assessment   Nursing information obtained from:  Patient Demographic factors:  NA Current Mental Status:  Belief that plan would result in death Loss Factors:  NA Historical Factors:  NA Risk Reduction Factors:  NA  Total Time spent with patient: 1 hour Principal Problem: Schizophrenia, undifferentiated (HCC) Diagnosis:  Principal Problem:   Schizophrenia, undifferentiated (HCC) Active Problems:   Chronic hepatitis C (HCC)   Essential hypertension  Subjective Data: Patient not able to give much in the way of lucid history.  Continued Clinical Symptoms:  Alcohol Use Disorder Identification Test Final Score (AUDIT): 0 The "Alcohol Use Disorders Identification Test", Guidelines for Use in Primary Care, Second Edition.  World Science writer [facility]). Score between 0-7:  no or low risk or alcohol related problems. Score between 8-15:  moderate risk of alcohol related problems. Score between 16-19:  high risk of alcohol related problems. Score 20 or above:  warrants further diagnostic evaluation for alcohol dependence and treatment.   CLINICAL FACTORS:   Schizophrenia:   Paranoid or undifferentiated type   Musculoskeletal: Strength & Muscle Tone: spastic Gait & Station: unsteady Patient leans: N/A  Psychiatric Specialty Exam:  Presentation  General Appearance: Bizarre; Disheveled  Eye Contact:Fleeting  Speech:Garbled; Pressured; Slurred  Speech Volume:Increased  Handedness:Right   Mood and Affect  Mood:-- (unable to assess)  Affect:Inappropriate; Full Range   Thought Process  Thought Processes:Disorganized  Descriptions of Associations:Tangential  Orientation:Other (comment) (unable to assess)  Thought Content:Illogical  History of Schizophrenia/Schizoaffective disorder:Yes  Duration of Psychotic Symptoms:Greater than six months  Hallucinations:Hallucinations: Other (comment) (unable to assess)  Ideas of Reference:--  (unable to assess)  Suicidal Thoughts:Suicidal Thoughts: -- (unable to assess)  Homicidal Thoughts:No data recorded  Sensorium  Memory:No data recorded Judgment:Impaired  Insight:None   Executive Functions  Concentration:Poor  Attention Span:Poor  Recall:Poor  Fund of Knowledge:Poor  Language:Poor   Psychomotor Activity  Psychomotor Activity:Psychomotor Activity: Restlessness; Tremor   Assets  Assets:Other (comment)   Sleep  Sleep:Sleep: Poor    Physical Exam: Physical Exam Vitals and nursing note reviewed.  Constitutional:      Appearance: Normal appearance.  HENT:     Head: Normocephalic and atraumatic.     Mouth/Throat:     Pharynx: Oropharynx is clear.  Eyes:     Pupils: Pupils are equal, round, and reactive to light.  Cardiovascular:     Rate and Rhythm: Normal rate and regular rhythm.  Pulmonary:     Effort: Pulmonary effort is normal.     Breath sounds: Normal breath sounds.  Abdominal:     General: Abdomen is flat.     Palpations: Abdomen is soft.  Musculoskeletal:        General: Normal range of motion.  Skin:    General: Skin is warm and dry.  Neurological:     General: No focal deficit present.     Mental Status: He is alert. Mental status is at baseline.  Psychiatric:        Attention and Perception: He is inattentive.        Mood and Affect: Mood normal. Affect is labile.        Speech: He is noncommunicative. Speech is tangential.        Behavior: Behavior is agitated.        Thought Content: Thought content is delusional. Thought content does not include homicidal or suicidal ideation.        Cognition and Memory: Cognition is impaired. Memory is impaired.  Judgment: Judgment is inappropriate.    Review of Systems  Constitutional: Negative.   HENT: Negative.   Eyes: Negative.   Respiratory: Negative.   Cardiovascular: Negative.   Gastrointestinal: Negative.   Musculoskeletal: Negative.   Skin: Negative.    Neurological: Negative.   Psychiatric/Behavioral: Positive for hallucinations. The patient is nervous/anxious.    Blood pressure (!) 151/97, pulse 75, temperature 97.9 F (36.6 C), temperature source Oral, resp. rate 16, height 6\' 1"  (1.854 m), weight 68.9 kg, SpO2 100 %. Body mass index is 20.05 kg/m.   COGNITIVE FEATURES THAT CONTRIBUTE TO RISK:  Loss of executive function    SUICIDE RISK:   Minimal: No identifiable suicidal ideation.  Patients presenting with no risk factors but with morbid ruminations; may be classified as minimal risk based on the severity of the depressive symptoms  PLAN OF CARE: This is a 65 year old man with a past history of schizophrenia.  Little is known about him.  Patient is very psychotic and disorganized and unable to communicate much in the way of useful history.  He has not made any suicidal statements or done anything indicating self-harm since being in the hospital.  We have tried to get more collateral information so far without success.  Continue 15-minute checks or as needed with medication management for psychosis and daily assessment of dangerousness prior to discharge planning  I certify that inpatient services furnished can reasonably be expected to improve the patient's condition.   66, MD 11/27/2020, 7:18 PM

## 2020-11-27 NOTE — [facility] Group Notes (Signed)
LCSW Group Therapy Note     11/27/2020 2:25 PM     Type of Therapy/Topic:  Group Therapy:  Feelings about Diagnosis     Participation Level:  Did Not Attend     Description of Group:   This group will allow patients to explore their thoughts and feelings about diagnoses they have received. Patients will be guided to explore their level of understanding and acceptance of these diagnoses. Facilitator will encourage patients to process their thoughts and feelings about the reactions of others to their diagnosis and will guide patients in identifying ways to discuss their diagnosis with significant others in their lives. This group will be process-oriented, with patients participating in exploration of their own experiences, giving and receiving support, and processing challenge from other group members.        Therapeutic Goals:  1.    Patient will demonstrate understanding of diagnosis as evidenced by identifying two or more symptoms of the disorder  2.    Patient will be able to express two feelings regarding the diagnosis  3.    Patient will demonstrate their ability to communicate their needs through discussion and/or role play     Summary of Patient Progress: X  Therapeutic Modalities:   Cognitive Behavioral Therapy  Brief Therapy  Feelings Identification    Shene Maxfield [place], MSW, LCSW-A  11/27/2020 2:25 PM

## 2020-11-27 NOTE — Progress Notes (Addendum)
Patient is seen pacing the unit halls. He is using a front-wheel walker. He denies SI, HI, AVH, depression, anxiety, and pain. He complained of throat irritation which was relieved with a lozenge. Patient was given nicotine patch for cravings. He attended group outside and played basketball with his peers. He is medication compliant. Will continue to monitor with 15 minute safety checks.

## 2020-11-28 DIAGNOSIS — F203 Undifferentiated schizophrenia: Secondary | ICD-10-CM | POA: Diagnosis not present

## 2020-11-28 NOTE — Progress Notes (Signed)
Patient pleasant and cooperative this shift. Noted responding to internal stimuli. Laughing, smiling and talking with staff and peers. Most of early shift in dayroom. No incontinent episode noted thus far. Medication compliant. Prn given for anxiety along with med for sleep. Snacks provided. Encouragement and support provided. Safety checks maintained. Medications given as prescribed. Pt receptive and remains safe on unit with q 15  Min checks.

## 2020-11-28 NOTE — Progress Notes (Signed)
Recreation Therapy Notes   Date: 11/28/2020  Time: 10:00 am   Location: Courtyard   Behavioral response: Appropriate  Intervention Topic: Leisure   Discussion/Intervention:  Group content today was focused on leisure. The group defined what leisure is and some positive leisure activities they participate in. Individuals identified the difference between good and bad leisure. Participants expressed how they feel after participating in the leisure of their choice. The group discussed how they go about picking a leisure activity and if others are involved in their leisure activities. The patient stated how many leisure activities they have to choose from and reasons why it is important to have leisure time. Individuals participated in the intervention "Exploration of Leisure" where they had a chance to identify new leisure activities as well as benefits of leisure. Clinical Observations/Feedback: Patient came to group and was focused on what peers and staff had to say. He identified basket ball, music and smoking cigarettes as leisure activities he enjoys. Individual was social with staff and peers while participating in the intervention. Shanieka Blea LRT/CTRS         Orton Capell 11/28/2020 12:07 PM

## 2020-11-28 NOTE — B[facility] Counselor (Signed)
CSW reached out to [facility] [phone]). CSW was informed that Drucilla Schmidt was not at the facility currently and was given contact information to reach her.   CSW contacted Drucilla Schmidt, [facility], [phone] regarding placement. Lea stated that pt could return there when he is doing better. She states that she believes pt has been using substances and that he shared with her that he had been using crack. CSW inquired if pt had a guardian. She stated that pt is his own guardian. CSW inquired regarding his family and if he could get the contact information for pt's mother (as Clint Lipps shares that pt and his mother talk daily). She stated that she would look for the mother's number when she gets back to the facility. Lea asked that she be updated regarding the patient as he will be returning to her facility.   Vilma Meckel. Algis Greenhouse, MSW, LCSW, LCAS 11/28/2020 4:20 PM

## 2020-11-28 NOTE — [facility] Counselor (Signed)
Patient provided verbal consent for CSW to contact group home. CSW will continue with verbal consent as patient attempted to eat pen during prior encounter.   Signed:  Corky Crafts, MSW, [place], LCASA 11/28/2020 11:26 AM

## 2020-11-28 NOTE — Progress Notes (Signed)
Pt has been med compliant and cooperative. Pt has been responding to [facility] for the majority of the day. Pt still has a walker. Pt did attend group and go outside. Pt has a good appetite. Torrie Mayers RN

## 2020-11-28 NOTE — B[facility] Counselor (Signed)
CSW team reached out to [facility] Leta Jungling [phone]) [phone]. Call goes directly to voicemail and the mailbox is full.   Vilma Meckel. Algis Greenhouse, MSW, LCSW, LCAS 11/28/2020 3:48 PM

## 2020-11-28 NOTE — Progress Notes (Signed)
[facility] MD Progress Note  11/28/2020 6:56 PM Henry Black  MRN:  [id number] Subjective: Follow-up for 65 year old man with schizophrenia.  Patient seen in treatment team and seen on the ward.  Vital signs reviewed.  Blood pressure still a little bit high but he also seems to be a possible fall risk so I have elected not to change any blood pressure medicine.  Patient is only partially understandable.  Bizarre behavior intermittently but also able to interact with staff.  No threats no violence no aggression or evidence of self-injury. Principal Problem: Schizophrenia, undifferentiated ([place]) Diagnosis: Principal Problem:   Schizophrenia, undifferentiated ([place]) Active Problems:   Chronic hepatitis C ([place])   Essential hypertension  Total Time spent with patient: 30 minutes  Past Psychiatric History: Little information available.  Past history of schizophrenia  Past Medical History:  Past Medical History:  Diagnosis Date  . Hepatitis   . Hypertension     Past Surgical History:  Procedure Laterality Date  . ESOPHAGOGASTRODUODENOSCOPY (EGD) WITH PROPOFOL N/A 12/21/2019   Procedure: ESOPHAGOGASTRODUODENOSCOPY (EGD) WITH PROPOFOL;  Surgeon: Lin Landsman, MD;  Location: [place];  Service: Gastroenterology;  Laterality: N/A;   Family History: History reviewed. No pertinent family history. Family Psychiatric  History: See previous Social History:  Social History   Substance and Sexual Activity  Alcohol Use Yes  . Alcohol/week: 1.0 standard drink  . Types: 1 Standard drinks or equivalent per week     Social History   Substance and Sexual Activity  Drug Use Not Currently    Social History   Socioeconomic History  . Marital status: Single    Spouse name: Not on file  . Number of children: Not on file  . Years of education: Not on file  . Highest education level: Not on file  Occupational History  . Not on file  Tobacco Use  . Smoking status: Current Every Day Smoker     Packs/day: 0.50    Years: 46.00    Pack years: 23.00    Types: Cigarettes  . Smokeless tobacco: Never Used  Vaping Use  . Vaping Use: Never used  Substance and Sexual Activity  . Alcohol use: Yes    Alcohol/week: 1.0 standard drink    Types: 1 Standard drinks or equivalent per week  . Drug use: Not Currently  . Sexual activity: Not on file  Other Topics Concern  . Not on file  Social History Narrative  . Not on file   Social Determinants of Health   Financial Resource Strain: Not on file  Food Insecurity: Not on file  Transportation Needs: Not on file  Physical Activity: Not on file  Stress: Not on file  Social Connections: Not on file   Additional Social History:                         Sleep: Fair  Appetite:  Fair  Current Medications: Current Facility-Administered Medications  Medication Dose Route Frequency Provider Last Rate Last Admin  . acetaminophen (TYLENOL) tablet 650 mg  650 mg Oral Q6H PRN , Madie Reno, MD   650 mg at 11/27/20 0253  . alum & mag hydroxide-simeth (MAALOX/MYLANTA) 200-200-20 MG/5ML suspension 30 mL  30 mL Oral Q4H PRN ,  T, MD      . atorvastatin (LIPITOR) tablet 20 mg  20 mg Oral Daily , Madie Reno, MD   20 mg at 11/28/20 0805  . benztropine (COGENTIN) tablet 1 mg  1 mg  Oral BID , Madie Reno, MD   1 mg at 11/28/20 1656  . clonazePAM (KLONOPIN) tablet 1 mg  1 mg Oral BID ,  T, MD   1 mg at 11/28/20 1655  . haloperidol (HALDOL) tablet 2 mg  2 mg Oral Q6H PRN ,  T, MD      . haloperidol (HALDOL) tablet 5 mg  5 mg Oral BID ,  T, MD   5 mg at 11/28/20 1655  . hydrOXYzine (ATARAX/VISTARIL) tablet 50 mg  50 mg Oral TID PRN , Madie Reno, MD   50 mg at 11/27/20 2109  . LORazepam (ATIVAN) tablet 2 mg  2 mg Oral Q6H PRN , Madie Reno, MD   2 mg at 11/27/20 2109   Or  . LORazepam (ATIVAN) injection 2 mg  2 mg Intramuscular Q6H PRN ,  T, MD      . losartan (COZAAR) tablet 100  mg  100 mg Oral Daily , Madie Reno, MD   100 mg at 11/28/20 0806  . magnesium hydroxide (MILK OF MAGNESIA) suspension 30 mL  30 mL Oral Daily PRN ,  T, MD      . menthol-cetylpyridinium (CEPACOL) lozenge 3 mg  1 lozenge Oral PRN ,  T, MD      . nicotine (NICODERM CQ - dosed in mg/24 hours) patch 21 mg  21 mg Transdermal Daily , Madie Reno, MD   21 mg at 11/28/20 0807  . temazepam (RESTORIL) capsule 15 mg  15 mg Oral QHS , Madie Reno, MD   15 mg at 11/27/20 2109    Lab Results:  Results for orders placed or performed during the hospital encounter of 11/26/20 (from the past 48 hour(s))  Hemoglobin A1c     Status: None   Collection Time: 11/27/20  7:11 AM  Result Value Ref Range   Hgb A1c MFr Bld 5.4 4.8 - 5.6 %    Comment: (NOTE) Pre diabetes:          5.7%-6.4%  Diabetes:              >6.4%  Glycemic control for   <7.0% adults with diabetes    Mean Plasma Glucose 108.28 mg/dL    Comment: Performed at [facility], [place] [address] [place],  [address]  Lipid panel     Status: None   Collection Time: 11/27/20  7:11 AM  Result Value Ref Range   Cholesterol 140 0 - 200 mg/dL   Triglycerides 30 <150 mg/dL   HDL 61 >40 mg/dL   Total CHOL/HDL Ratio 2.3 RATIO   VLDL 6 0 - 40 mg/dL   LDL Cholesterol 73 0 - 99 mg/dL    Comment:        Total Cholesterol/HDL:CHD Risk Coronary Heart Disease Risk Table                     Men   Women  1/2 Average Risk   3.4   3.3  Average Risk       5.0   4.4  2 X Average Risk   9.6   7.1  3 X Average Risk  23.4   11.0        Use the calculated Patient Ratio above and the CHD Risk Table to determine the patient's CHD Risk.        ATP III CLASSIFICATION (LDL):  <100     mg/dL   Optimal  100-129  mg/dL  Near or Above                    Optimal  130-159  mg/dL   Borderline  160-189  mg/dL   High  >190     mg/dL   Very High Performed at [facility], [address] [place], [place] [address]    TSH     Status: None   Collection Time: 11/27/20  7:11 AM  Result Value Ref Range   TSH 2.051 0.350 - 4.500 uIU/mL    Comment: Performed by a 3rd Generation assay with a functional sensitivity of <=0.01 uIU/mL. Performed at [facility], [address], [place], [place] [address]   Urine Drug Screen, Qualitative [facility] only)     Status: Abnormal   Collection Time: 11/27/20 11:45 AM  Result Value Ref Range   Tricyclic, Ur Screen NONE DETECTED NONE DETECTED   Amphetamines, Ur Screen NONE DETECTED NONE DETECTED   MDMA (Ecstasy)Ur Screen NONE DETECTED NONE DETECTED   Cocaine Metabolite,Ur [place] NONE DETECTED NONE DETECTED   Opiate, Ur Screen NONE DETECTED NONE DETECTED   Phencyclidine (PCP) Ur S NONE DETECTED NONE DETECTED   Cannabinoid 50 Ng, Ur [place] NONE DETECTED NONE DETECTED   Barbiturates, Ur Screen NONE DETECTED NONE DETECTED   Benzodiazepine, Ur Scrn POSITIVE (A) NONE DETECTED   Methadone Scn, Ur NONE DETECTED NONE DETECTED    Comment: (NOTE) Tricyclics + metabolites, urine    Cutoff 1000 ng/mL Amphetamines + metabolites, urine  Cutoff 1000 ng/mL MDMA (Ecstasy), urine              Cutoff 500 ng/mL Cocaine Metabolite, urine          Cutoff 300 ng/mL Opiate + metabolites, urine        Cutoff 300 ng/mL Phencyclidine (PCP), urine         Cutoff 25 ng/mL Cannabinoid, urine                 Cutoff 50 ng/mL Barbiturates + metabolites, urine  Cutoff 200 ng/mL Benzodiazepine, urine              Cutoff 200 ng/mL Methadone, urine                   Cutoff 300 ng/mL  The urine drug screen provides only a preliminary, unconfirmed analytical test result and should not be used for non-medical purposes. Clinical consideration and professional judgment should be applied to any positive drug screen result due to possible interfering substances. A more specific alternate chemical method must be used in order to obtain a confirmed analytical result. Gas chromatography / mass spectrometry  (GC/MS) is the preferred confirm atory method. Performed at [facility], [address] [place], [place] [address]     Blood Alcohol level:  Lab Results  Component Value Date   [facility] <10 11/25/2020   ETH <10 12/87/8676    Metabolic Disorder Labs: Lab Results  Component Value Date   HGBA1C 5.4 11/27/2020   MPG 108.28 11/27/2020   No results found for: PROLACTIN Lab Results  Component Value Date   CHOL 140 11/27/2020   TRIG 30 11/27/2020   HDL 61 11/27/2020   CHOLHDL 2.3 11/27/2020   VLDL 6 11/27/2020   LDLCALC 73 11/27/2020    Physical Findings: AIMS:  , ,  ,  ,    CIWA:    COWS:     Musculoskeletal: Strength & Muscle Tone: within normal limits Gait &  Station: unsteady Patient leans: N/A  Psychiatric Specialty Exam:  Presentation  General Appearance: Bizarre; Disheveled  Eye Contact:Fleeting  Speech:Garbled; Pressured; Slurred  Speech Volume:Increased  Handedness:Right   Mood and Affect  Mood:-- (unable to assess)  Affect:Inappropriate; Full Range   Thought Process  Thought Processes:Disorganized  Descriptions of Associations:Tangential  Orientation:Other (comment) (unable to assess)  Thought Content:Illogical  History of Schizophrenia/Schizoaffective disorder:Yes  Duration of Psychotic Symptoms:Greater than six months  Hallucinations:No data recorded Ideas of Reference:-- (unable to assess)  Suicidal Thoughts:No data recorded Homicidal Thoughts:No data recorded  Sensorium  Memory:No data recorded Judgment:Impaired  Insight:None   Executive Functions  Concentration:Poor  Attention Span:Poor  Recall:Poor  Fund of Knowledge:Poor  Language:Poor   Psychomotor Activity  Psychomotor Activity:No data recorded  Assets  Assets:Other (comment)   Sleep  Sleep:No data recorded   Physical Exam: Physical Exam Vitals and nursing note reviewed.  Constitutional:      Appearance: Normal appearance.  HENT:      Head: Normocephalic and atraumatic.     Mouth/Throat:     Pharynx: Oropharynx is clear.  Eyes:     Pupils: Pupils are equal, round, and reactive to light.  Cardiovascular:     Rate and Rhythm: Normal rate and regular rhythm.  Pulmonary:     Effort: Pulmonary effort is normal.     Breath sounds: Normal breath sounds.  Abdominal:     General: Abdomen is flat.     Palpations: Abdomen is soft.  Musculoskeletal:        General: Normal range of motion.  Skin:    General: Skin is warm and dry.  Neurological:     General: No focal deficit present.     Mental Status: He is alert. Mental status is at baseline.  Psychiatric:        Attention and Perception: He is inattentive.        Mood and Affect: Mood normal. Affect is labile and inappropriate.        Speech: He is noncommunicative. Speech is tangential.        Behavior: Behavior is agitated. Behavior is not aggressive.        Thought Content: Thought content is delusional.        Cognition and Memory: Cognition is impaired. Memory is impaired.    Review of Systems  Unable to perform ROS: Psychiatric disorder   Blood pressure (!) 155/95, pulse 74, temperature 98.1 F (36.7 C), temperature source Oral, resp. rate 18, height 6' 1" (1.854 m), weight 68.9 kg, SpO2 100 %. Body mass index is 20.05 kg/m.   Treatment Plan Summary: Medication management and Plan Patient met with treatment team today.  He was unable to articulate any clear goals except possibly his desire for a cigarette.  He wanders around the ward and interacts with staff.  Follows orders fairly well.  No violence or threats.  Unclear what his baseline is but he may be added.  We have still not been able to reach his group home and disposition plans are still up in the air.  As noted above I am not adding any blood pressure medicine for now because I do not want to risk a fall.  Alethia Berthold, MD 11/28/2020, 6:56 PM

## 2020-11-28 NOTE — Plan of Care (Signed)
  Problem: Education: Goal: Emotional status will improve Outcome: Progressing Goal: Mental status will improve Outcome: Progressing   Problem: Safety: Goal: Periods of time without injury will increase Outcome: Progressing   Problem: Safety: Goal: Ability to redirect hostility and anger into socially appropriate behaviors will improve Outcome: Progressing Goal: Ability to remain free from injury will improve Outcome: Progressing

## 2020-11-28 NOTE — BHH Group Notes (Signed)
LCSW Group Therapy Note  11/28/2020 2:20 PM  Type of Therapy/Topic:  Group Therapy:  Emotion Regulation  Participation Level:  Active   Description of Group:   The purpose of this group is to assist patients in learning to regulate negative emotions and experience positive emotions. Patients will be guided to discuss ways in which they have been vulnerable to their negative emotions. These vulnerabilities will be juxtaposed with experiences of positive emotions or situations, and patients will be challenged to use positive emotions to combat negative ones. Special emphasis will be placed on coping with negative emotions in conflict situations, and patients will process healthy conflict resolution skills.  Therapeutic Goals: 1. Patient will identify two positive emotions or experiences to reflect on in order to balance out negative emotions 2. Patient will label two or more emotions that they find the most difficult to experience 3. Patient will demonstrate positive conflict resolution skills through discussion and/or role plays  Summary of Patient Progress: Patient was present for the majority of the group. He spoke at length but was disorganized and had nothing to do with the topic at hand. However, he was easily redirected but this did not last long before he was back to previous behaviors.   Therapeutic Modalities:   Cognitive Behavioral Therapy Feelings Identification Dialectical Behavioral Therapy  Vilma Meckel. Algis Greenhouse, MSW, LCSW, LCAS 11/28/2020 2:20 PM

## 2020-11-28 NOTE — Tx Team (Addendum)
Interdisciplinary Treatment and Diagnostic Plan Update  11/28/2020 Time of Session: 9:40am Henry Black MRN: [id number]  Principal Diagnosis: Schizophrenia, undifferentiated ([place])  Secondary Diagnoses: Principal Problem:   Schizophrenia, undifferentiated ([place]) Active Problems:   Chronic hepatitis C ([place])   Essential hypertension   Current Medications:  Current Facility-Administered Medications  Medication Dose Route Frequency Provider Last Rate Last Admin  . acetaminophen (TYLENOL) tablet 650 mg  650 mg Oral Q6H PRN Clapacs, Madie Reno, MD   650 mg at 11/27/20 0253  . alum & mag hydroxide-simeth (MAALOX/MYLANTA) 200-200-20 MG/5ML suspension 30 mL  30 mL Oral Q4H PRN Clapacs, [name], MD      . atorvastatin (LIPITOR) tablet 20 mg  20 mg Oral Daily Clapacs, Madie Reno, MD   20 mg at 11/28/20 0805  . benztropine (COGENTIN) tablet 1 mg  1 mg Oral BID Clapacs, Madie Reno, MD   1 mg at 11/28/20 0805  . clonazePAM (KLONOPIN) tablet 1 mg  1 mg Oral BID Clapacs, [name], MD   1 mg at 11/28/20 0805  . haloperidol (HALDOL) tablet 2 mg  2 mg Oral Q6H PRN Clapacs, [name], MD      . haloperidol (HALDOL) tablet 5 mg  5 mg Oral BID Clapacs, Madie Reno, MD   5 mg at 11/28/20 0805  . hydrOXYzine (ATARAX/VISTARIL) tablet 50 mg  50 mg Oral TID PRN Clapacs, Madie Reno, MD   50 mg at 11/27/20 2109  . LORazepam (ATIVAN) tablet 2 mg  2 mg Oral Q6H PRN Clapacs, Madie Reno, MD   2 mg at 11/27/20 2109   Or  . LORazepam (ATIVAN) injection 2 mg  2 mg Intramuscular Q6H PRN Clapacs, [name], MD      . losartan (COZAAR) tablet 100 mg  100 mg Oral Daily Clapacs, Madie Reno, MD   100 mg at 11/28/20 0806  . magnesium hydroxide (MILK OF MAGNESIA) suspension 30 mL  30 mL Oral Daily PRN Clapacs, [name], MD      . menthol-cetylpyridinium (CEPACOL) lozenge 3 mg  1 lozenge Oral PRN Clapacs, [name], MD      . nicotine (NICODERM CQ - dosed in mg/24 hours) patch 21 mg  21 mg Transdermal Daily Clapacs, Madie Reno, MD   21 mg at 11/28/20 0807  . temazepam  (RESTORIL) capsule 15 mg  15 mg Oral QHS Clapacs, Madie Reno, MD   15 mg at 11/27/20 2109   PTA Medications: Medications Prior to Admission  Medication Sig Dispense Refill Last Dose  . atorvastatin (LIPITOR) 20 MG tablet Take 20 mg by mouth daily.     . benztropine (COGENTIN) 2 MG tablet Take 2 mg by mouth 2 (two) times daily.     . Cholecalciferol 2000 units CAPS Take 2,000 Units by mouth daily.     . clonazePAM (KLONOPIN) 1 MG tablet Take 1 mg by mouth 2 (two) times daily.     . haloperidol decanoate (HALDOL DECANOATE) 100 MG/ML injection Inject 150 mg into the muscle every 28 (twenty-eight) days.     Marland Kitchen losartan (COZAAR) 100 MG tablet Take 100 mg by mouth daily.     . LUCEMYRA 0.18 MG TABS      . Sofosbuvir-Velpatasvir (EPCLUSA) 400-100 MG TABS Take 1 tablet by mouth daily. 28 tablet 2     Patient Stressors: Educational concerns Medication change or noncompliance  Patient Strengths: Physical Health Supportive family/friends  Treatment Modalities: Medication Management, Group therapy, Case management,  1 to 1 session with clinician,  Psychoeducation, Recreational therapy.   Physician Treatment Plan for Primary Diagnosis: Schizophrenia, undifferentiated ([place]) Long Term Goal(s): Improvement in symptoms so as ready for discharge Improvement in symptoms so as ready for discharge   Short Term Goals: Ability to verbalize feelings will improve Ability to demonstrate self-control will improve Ability to identify and develop effective coping behaviors will improve Ability to maintain clinical measurements within normal limits will improve Compliance with prescribed medications will improve  Medication Management: Evaluate patient's response, side effects, and tolerance of medication regimen.  Therapeutic Interventions: 1 to 1 sessions, Unit Group sessions and Medication administration.  Evaluation of Outcomes: Not Met  Physician Treatment Plan for Secondary Diagnosis: Principal Problem:    Schizophrenia, undifferentiated ([place]) Active Problems:   Chronic hepatitis C ([place])   Essential hypertension  Long Term Goal(s): Improvement in symptoms so as ready for discharge Improvement in symptoms so as ready for discharge   Short Term Goals: Ability to verbalize feelings will improve Ability to demonstrate self-control will improve Ability to identify and develop effective coping behaviors will improve Ability to maintain clinical measurements within normal limits will improve Compliance with prescribed medications will improve     Medication Management: Evaluate patient's response, side effects, and tolerance of medication regimen.  Therapeutic Interventions: 1 to 1 sessions, Unit Group sessions and Medication administration.  Evaluation of Outcomes: Not Met   RN Treatment Plan for Primary Diagnosis: Schizophrenia, undifferentiated ([place]) Long Term Goal(s): Knowledge of disease and therapeutic regimen to maintain health will improve  Short Term Goals: Ability to remain free from injury will improve, Ability to verbalize frustration and anger appropriately will improve, Ability to demonstrate self-control, Ability to participate in decision making will improve, Ability to verbalize feelings will improve, Ability to identify and develop effective coping behaviors will improve and Compliance with prescribed medications will improve  Medication Management: RN will administer medications as ordered by provider, will assess and evaluate patient's response and provide education to patient for prescribed medication. RN will report any adverse and/or side effects to prescribing provider.  Therapeutic Interventions: 1 on 1 counseling sessions, Psychoeducation, Medication administration, Evaluate responses to treatment, Monitor vital signs and CBGs as ordered, Perform/monitor CIWA, COWS, AIMS and Fall Risk screenings as ordered, Perform wound care treatments as ordered.  Evaluation of  Outcomes: Not Met   LCSW Treatment Plan for Primary Diagnosis: Schizophrenia, undifferentiated ([place]) Long Term Goal(s): Safe transition to appropriate next level of care at discharge, Engage patient in therapeutic group addressing interpersonal concerns.  Short Term Goals: Engage patient in aftercare planning with referrals and resources, Increase social support, Increase ability to appropriately verbalize feelings, Increase emotional regulation, Facilitate acceptance of mental health diagnosis and concerns, Facilitate patient progression through stages of change regarding substance use diagnoses and concerns, Identify triggers associated with mental health/substance abuse issues and Increase skills for wellness and recovery  Therapeutic Interventions: Assess for all discharge needs, 1 to 1 time with Social worker, Explore available resources and support systems, Assess for adequacy in community support network, Educate family and significant other(s) on suicide prevention, Complete Psychosocial Assessment, Interpersonal group therapy.  Evaluation of Outcomes: Not Met   Progress in Treatment: Attending groups: No. Participating in groups: No. Taking medication as prescribed: Yes. Toleration medication: Yes. Family/Significant other contact made: No, will contact:  when given permission.  Patient understands diagnosis: No. Discussing patient identified problems/goals with staff: No. Medical problems stabilized or resolved: Yes. Denies suicidal/homicidal ideation: Yes. Issues/concerns per patient self-inventory: No. Other: None.  New problem(s) identified:  No, Describe:  none.  New Short Term/Long Term Goal(s): elimination of symptoms of psychosis, medication management for mood stabilization; elimination of SI thoughts; development of comprehensive mental wellness/sobriety plan.  Patient Goals: "To get these wet clothes off." Pt was reported to have been incontinent this morning but had  been doing better. Staff to remind pt to utilize the bathroom.    Discharge Plan or Barriers: CSW will assist with development of an appropriate discharge plan.  Reason for Continuation of Hospitalization: Medication stabilization  Estimated Length of Stay: 1-7 days  Recreational Therapy: Patient Stressors: N/A Patient Goal: Patient will engage in groups without prompting or encouragement from LRT x3 group sessions within 5 recreation therapy group sessions.  Attendees: Patient: Henry Black 11/28/2020 10:08 AM  Physician: Gonzella Lex, MD 11/28/2020 10:08 AM  Nursing: Collier Bullock, RN 11/28/2020 10:08 AM  RN Care Manager: 11/28/2020 10:08 AM  Social Worker: Chalmers Guest. Guerry Bruin, MSW, [place], [place] 11/28/2020 10:08 AM  Recreational Therapist: Devin Going, LRT  11/28/2020 10:08 AM  Other: Kiva Martinique, MSW, LCSW-A 11/28/2020 10:08 AM  Other: Paulla Dolly, MSW, [place], [place] 11/28/2020 10:08 AM  Other: 11/28/2020 10:08 AM    Scribe for Treatment Team: Shirl Harris, LCSW 11/28/2020 10:08 AM

## 2020-11-28 NOTE — [facility] Counselor (Signed)
CSW attempted to contact pt's mother, Alecsander Hattabaugh ([phone]) which pt gave yesterday during complete of consent for contact with collateral/familial contact. HIPPA compliant voicemail left with contact information for follow up.  Vilma Meckel. Algis Greenhouse, MSW, LCSW, LCAS 11/28/2020 11:32 AM

## 2020-11-28 NOTE — Plan of Care (Signed)
Pt denies depression, anxiety, AVH, SI and HI. Pt was educated on care plan and verbalizes understanding. Torrie Mayers RN Problem: Education: Goal: Knowledge of [facility] Education information/materials will improve Outcome: Progressing Goal: Emotional status will improve Outcome: Progressing Goal: Mental status will improve Outcome: Progressing Goal: Verbalization of understanding the information provided will improve Outcome: Progressing   Problem: Safety: Goal: Periods of time without injury will increase Outcome: Progressing   Problem: Activity: Goal: Will verbalize the importance of balancing activity with adequate rest periods Outcome: Progressing   Problem: Safety: Goal: Ability to redirect hostility and anger into socially appropriate behaviors will improve Outcome: Progressing Goal: Ability to remain free from injury will improve Outcome: Progressing

## 2020-11-29 ENCOUNTER — Ambulatory Visit: Payer: Medicare Other

## 2020-11-29 ENCOUNTER — Inpatient Hospital Stay: Payer: Medicare Other

## 2020-11-29 LAB — GLUCOSE, CAPILLARY: Glucose-Capillary: 84 mg/dL (ref 70–99)

## 2020-11-29 MED ORDER — DIPHENHYDRAMINE HCL 50 MG/ML IJ SOLN: 50.0000 mg | Freq: Once | INTRAMUSCULAR | Status: AC

## 2020-11-29 MED ORDER — DIPHENHYDRAMINE HCL 50 MG/ML IJ SOLN
INTRAMUSCULAR | Status: AC
  Administered 2020-11-29: 50 mg via INTRAMUSCULAR
  Filled 2020-11-29: qty 1

## 2020-11-29 MED ORDER — LORAZEPAM 2 MG/ML IJ SOLN: 1.0000 mg | Freq: Once | INTRAMUSCULAR | Status: AC

## 2020-11-29 MED ORDER — LORAZEPAM 2 MG/ML IJ SOLN
INTRAMUSCULAR | Status: AC
  Administered 2020-11-29: 1 mg via INTRAMUSCULAR
  Filled 2020-11-29: qty 1

## 2020-11-29 MED ORDER — DIVALPROEX SODIUM 250 MG PO DR TAB
250.0000 mg | DELAYED_RELEASE_TABLET | Freq: Two times a day (BID) | ORAL | Status: DC
  Administered 2020-11-29 – 2020-12-02 (×6): 250 mg via ORAL
  Filled 2020-11-29 (×6): qty 1

## 2020-11-29 NOTE — Progress Notes (Signed)
Pt. At this time observed up in the hallway utilizing the patient phones on the wall, appearing to be talking to someone, but the unit phones are not turned on.

## 2020-11-29 NOTE — [facility] Suicide Risk Assessment (Signed)
[facility] INPATIENT:  Family/Significant Other Suicide Prevention Education  Suicide Prevention Education:  Education Completed; [facility] [phone]), has been identified by the patient as the family member/significant other with whom the patient will be residing, and identified as the person(s) who will aid the patient in the event of a mental health crisis (suicidal ideations/suicide attempt).  With written consent from the patient, the family member/significant other has been provided the following suicide prevention education, prior to the and/or following the discharge of the patient.  The suicide prevention education provided includes the following:  Suicide risk factors  Suicide prevention and interventions  National Suicide Hotline telephone number  [facility] assessment telephone number  [facility] Emergency Assistance 911  [facility] and/or Residential Mobile Crisis Unit telephone number  Request made of family/significant other to:  Remove weapons (e.g., guns, rifles, knives), all items previously/currently identified as safety concern.    Remove drugs/medications (over-the-counter, prescriptions, illicit drugs), all items previously/currently identified as a safety concern.  The family member/significant other verbalizes understanding of the suicide prevention education information provided.  The family member/significant other agrees to remove the items of safety concern listed above.  Henry Black 11/29/2020, 1:57 PM

## 2020-11-29 NOTE — Progress Notes (Signed)
Recreation Therapy Notes  Date: 11/29/2020  Time: 9:30 am  Location: Courtyard    Behavioral response: Inappropriate   Intervention Topic: Animal Assisted Therapy   Discussion/Intervention:  Animal Assisted Therapy took place today during group.  Animal Assisted Therapy is the planned inclusion of an animal in a patient's treatment plan. The patients were able to engage in therapy with an animal during group. Participants were educated on what a service dog is and the different between a support dog and a service dog. Patient were informed on the many animal needs there are and how their needs are similar. Individuals were enlightened on the process to get a service animal or support animal. Patients got the opportunity to pet the animal and were offered emotional support from the animal and staff.  Clinical Observations/Feedback:  Patient came to group and need much redirection to focus on topic, not interrupt others and to use his walker. Participant was hard to understand and loud, but joyful.  Individual was social with peers, staff and animal while participating in group.  Henry Black LRT/CTRS         Henry Black 11/29/2020 11:18 AM

## 2020-11-29 NOTE — Plan of Care (Addendum)
Patient during assessments this morning endorsed a normal mood and affect was only mildly constricted. Pt. Denied si/hi/avh and endorsed ability to continue to remain safe on the unit. Pt. Orientation appeared grossly intact. Pt. Denied physical pain and endorsed toleration of medications thus far. Pt. Endorsed no problems with sleeping and or eating at this time. Pt. Had no complaints for this Clinical research associate. Pt. Verbalized understanding of need to utilize safety equipment for ambulation. Pt. Did not appear to be RTIS. Pt. Thought process superficially linear. Pt. Presents disheveled and appearing to completely attending to hygiene. Pt. Encouraged to attend to hygiene completely.   Patient has been complaint with medications and unit procedures thus far. Pt. Has been observed eating good thus far, observed getting up for breakfast to eat. Pt. Has been able to remain safe on the unit thus far. Pt. Has not been a behavioral concern. Pt. Mostly thus far observed resting in the day room watching tv.   Q x 15 minute observation checks in place/maintained for safety. Patient is provided with education throughout shift when appropriate and able.  Patient is given/offered medications per orders. Patient is encouraged to attend groups, participate in unit activities and continue with plan of care. Pt. Chart and plans of care reviewed. Pt. Given support and encouragement when appropriate and able.      Problem: Education: Goal: Emotional status will improve Outcome: Progressing Note: Pt. Endorsed a normal mood and affect was only mildly constricted. Pt. Denied si/hi/avh and endorsed ability to remain safe on the unit.   Goal: Mental status will improve Outcome: Progressing Note: Pt. Orientation intact.    Problem: Safety: Goal: Periods of time without injury will increase Outcome: Progressing Note: Pt. Denied si/hi/avh and endorsed ability to remain safe on the unit.

## 2020-11-29 NOTE — Plan of Care (Signed)
Patient can be irritable but is redirectable  Problem: Education: Goal: Emotional status will improve Outcome: Not Progressing Goal: Mental status will improve Outcome: Not Progressing

## 2020-11-29 NOTE — [facility] Group Notes (Signed)
LCSW Group Therapy Note  11/29/2020 2:36 PM  Type of Therapy/Topic:  Group Therapy:  Balance in Life  Participation Level:  Active  Description of Group:    This group will address the concept of balance and how it feels and looks when one is unbalanced. Patients will be encouraged to process areas in their lives that are out of balance and identify reasons for remaining unbalanced. Facilitators will guide patients in utilizing problem-solving interventions to address and correct the stressor making their life unbalanced. Understanding and applying boundaries will be explored and addressed for obtaining and maintaining a balanced life. Patients will be encouraged to explore ways to assertively make their unbalanced needs known to significant others in their lives, using other group members and facilitator for support and feedback.  Therapeutic Goals: 1. Patient will identify two or more emotions or situations they have that consume much of in their lives. 2. Patient will identify signs/triggers that life has become out of balance:  3. Patient will identify two ways to set boundaries in order to achieve balance in their lives:  4. Patient will demonstrate ability to communicate their needs through discussion and/or role plays  Summary of Patient Progress: Patient was present for the entirety of the group session. Patient was unable to engage in meaningful conversation, went on long monologues, mostly inaudible aside from select words.    Therapeutic Modalities:   Cognitive Behavioral Therapy Solution-Focused Therapy Assertiveness Training  Gwenevere Ghazi, MSW, [place], [place] 11/29/2020 2:36 PM

## 2020-11-29 NOTE — Consult Note (Signed)
  Follow-up on earlier progress note.  Patient had a head CT which was completely normal.  Shortly after the CT patient seems to have quickly returned to his baseline mental state that we were used to previously.  He is now ambulating independently moving about no longer exhibiting the odd behaviors that he had before.  Vital signs stable.  Think this episode is still unclear whether it was psychiatric behavioral or something that improved with the doses of Ativan or Benadryl.  Relieved however that there is no evidence of stroke or bleeding.  Reviewed situation with nursing.  I do not think that this warrants putting him on a one-to-one because I do not think we have any evidence that he actually fell to the ground.  I have now added a small amount of Depakote as an additional treatment for the agitation and confusion.

## 2020-11-29 NOTE — Progress Notes (Signed)
Patient calm and cooperative during assessment denying SI/HI/AVH. Patient observed interacting appropriately with staff and peers on the unit. Patient compliant with medication administration per MD orders. Pt given education, support, and encouragement to be active in his treatment plan. Patient being monitored Q 15 minutes for safety per unit protocol. Pt remains safe on the unit.  

## 2020-11-29 NOTE — Progress Notes (Signed)
[facility] MD Progress Note  11/29/2020 12:52 PM Henry Black  MRN:  [id number] Subjective: Follow-up note for this 65 year old man with a history of schizophrenia.  Patient continues to display disorganized behavior and thinking throughout the day.  He was up much of the night.  Still has some trouble with urinary incontinence.  We did get in contact with his group home owner who reports that his baseline is significantly better than what we are observing now.  At about 1230 today the patient was discovered to be lying on his back in his room trembling all over moaning.  Patient was unable to give any reliable history to explain the situation but seemed to indicate that he had not actually fallen.  He was not able to give a clear description of any symptoms other than to say he felt like he had something in his eye.  We have checked a set of vital signs which at this point is unremarkable.  His blood sugar has been checked and is in the mid 80s.  Patient is not showing any signs of stiffness that would be typical of either NMS or an extrapyramidal reaction.  He will go through spells of trembling and shaking but appears to voluntarily be able to calm himself down.  Patient has been ordered 1 mg of Ativan and 50 mg of Benadryl for anxiety and the possibility of EPS.  Also to help to facilitate getting a CT scan of his head which has been ordered to rule out hemorrhage or other acute intracranial process Principal Problem: Schizophrenia, undifferentiated (HCC) Diagnosis: Principal Problem:   Schizophrenia, undifferentiated (HCC) Active Problems:   Chronic hepatitis C (HCC)   Essential hypertension  Total Time spent with patient: 45 minutes  Past Psychiatric History: Details unclear appears to have longstanding schizophrenia unclear if he has developmental disability as well  Past Medical History:  Past Medical History:  Diagnosis Date  . Hepatitis   . Hypertension     Past Surgical History:  Procedure  Laterality Date  . ESOPHAGOGASTRODUODENOSCOPY (EGD) WITH PROPOFOL N/A 12/21/2019   Procedure: ESOPHAGOGASTRODUODENOSCOPY (EGD) WITH PROPOFOL;  Surgeon: Toney Reil, MD;  Location: [facility];  Service: Gastroenterology;  Laterality: N/A;   Family History: History reviewed. No pertinent family history. Family Psychiatric  History: No information Social History:  Social History   Substance and Sexual Activity  Alcohol Use Yes  . Alcohol/week: 1.0 standard drink  . Types: 1 Standard drinks or equivalent per week     Social History   Substance and Sexual Activity  Drug Use Not Currently    Social History   Socioeconomic History  . Marital status: Single    Spouse name: Not on file  . Number of children: Not on file  . Years of education: Not on file  . Highest education level: Not on file  Occupational History  . Not on file  Tobacco Use  . Smoking status: Current Every Day Smoker    Packs/day: 0.50    Years: 46.00    Pack years: 23.00    Types: Cigarettes  . Smokeless tobacco: Never Used  Vaping Use  . Vaping Use: Never used  Substance and Sexual Activity  . Alcohol use: Yes    Alcohol/week: 1.0 standard drink    Types: 1 Standard drinks or equivalent per week  . Drug use: Not Currently  . Sexual activity: Not on file  Other Topics Concern  . Not on file  Social History Narrative  . Not  on file   Social Determinants of Health   Financial Resource Strain: Not on file  Food Insecurity: Not on file  Transportation Needs: Not on file  Physical Activity: Not on file  Stress: Not on file  Social Connections: Not on file   Additional Social History:                         Sleep: Poor  Appetite:  Fair  Current Medications: Current Facility-Administered Medications  Medication Dose Route Frequency Provider Last Rate Last Admin  . diphenhydrAMINE (BENADRYL) 50 MG/ML injection           . LORazepam (ATIVAN) 2 MG/ML injection           .  acetaminophen (TYLENOL) tablet 650 mg  650 mg Oral Q6H PRN Lovel Suazo, [name], MD   650 mg at 11/27/20 0253  . alum & mag hydroxide-simeth (MAALOX/MYLANTA) 200-200-20 MG/5ML suspension 30 mL  30 mL Oral Q4H PRN Leida Luton T, MD      . atorvastatin (LIPITOR) tablet 20 mg  20 mg Oral Daily Barnaby Rippeon, [name], MD   20 mg at 11/29/20 0726  . benztropine (COGENTIN) tablet 1 mg  1 mg Oral BID Michelene Keniston, [name], MD   1 mg at 11/29/20 0726  . clonazePAM (KLONOPIN) tablet 1 mg  1 mg Oral BID Carlon Davidson T, MD   1 mg at 11/29/20 0865  . haloperidol (HALDOL) tablet 2 mg  2 mg Oral Q6H PRN Yianna Tersigni T, MD      . haloperidol (HALDOL) tablet 5 mg  5 mg Oral BID Ariday Brinker T, MD   5 mg at 11/29/20 0725  . hydrOXYzine (ATARAX/VISTARIL) tablet 50 mg  50 mg Oral TID PRN Taima Rada, [name], MD   50 mg at 11/27/20 2109  . LORazepam (ATIVAN) tablet 2 mg  2 mg Oral Q6H PRN Magnolia Mattila, [name], MD   2 mg at 11/28/20 2104   Or  . LORazepam (ATIVAN) injection 2 mg  2 mg Intramuscular Q6H PRN Dashonna Chagnon T, MD      . losartan (COZAAR) tablet 100 mg  100 mg Oral Daily Denisia Harpole, [name], MD   100 mg at 11/29/20 0727  . magnesium hydroxide (MILK OF MAGNESIA) suspension 30 mL  30 mL Oral Daily PRN Nazarene Bunning T, MD      . menthol-cetylpyridinium (CEPACOL) lozenge 3 mg  1 lozenge Oral PRN Jauan Wohl T, MD      . nicotine (NICODERM CQ - dosed in mg/24 hours) patch 21 mg  21 mg Transdermal Daily Lakely Elmendorf, [name], MD   21 mg at 11/29/20 0724  . temazepam (RESTORIL) capsule 15 mg  15 mg Oral QHS Chelcee Korpi T, MD   15 mg at 11/28/20 2104    Lab Results:  Results for orders placed or performed during the hospital encounter of 11/26/20 (from the past 48 hour(s))  Glucose, capillary     Status: None   Collection Time: 11/29/20 12:39 PM  Result Value Ref Range   Glucose-Capillary 84 70 - 99 mg/dL    Comment: Glucose reference range applies only to samples taken after fasting for at least 8 hours.   Comment 1 Notify RN      Blood Alcohol level:  Lab Results  Component Value Date   ETH <10 11/25/2020   ETH <10 09/16/2018    Metabolic Disorder Labs: Lab Results  Component Value Date   HGBA1C  5.4 11/27/2020   MPG 108.28 11/27/2020   No results found for: PROLACTIN Lab Results  Component Value Date   CHOL 140 11/27/2020   TRIG 30 11/27/2020   HDL 61 11/27/2020   CHOLHDL 2.3 11/27/2020   VLDL 6 11/27/2020   LDLCALC 73 11/27/2020    Physical Findings: AIMS:  , ,  ,  ,    CIWA:    COWS:     Musculoskeletal: Strength & Muscle Tone: Patient walks around stifflegged but then when there is direct testing of his joints appears to have full mobility and no involuntary stiffness Gait & Station: unsteady Patient leans: N/A  Psychiatric Specialty Exam:  Presentation  General Appearance: Bizarre; Disheveled  Eye Contact:Fleeting  Speech:Garbled; Pressured; Slurred  Speech Volume:Increased  Handedness:Right   Mood and Affect  Mood:-- (unable to assess)  Affect:Inappropriate; Full Range   Thought Process  Thought Processes:Disorganized  Descriptions of Associations:Tangential  Orientation:Other (comment) (unable to assess)  Thought Content:Illogical  History of Schizophrenia/Schizoaffective disorder:Yes  Duration of Psychotic Symptoms:Greater than six months  Hallucinations:No data recorded Ideas of Reference:-- (unable to assess)  Suicidal Thoughts:No data recorded Homicidal Thoughts:No data recorded  Sensorium  Memory:No data recorded Judgment:Impaired  Insight:None   Executive Functions  Concentration:Poor  Attention Span:Poor  Recall:Poor  Fund of Knowledge:Poor  Language:Poor   Psychomotor Activity  Psychomotor Activity:No data recorded  Assets  Assets:Other (comment)   Sleep  Sleep:No data recorded   Physical Exam: Physical Exam Vitals and nursing note reviewed.  Constitutional:      Appearance: Normal appearance.  HENT:     Head:  Normocephalic and atraumatic.     Mouth/Throat:     Pharynx: Oropharynx is clear.  Eyes:     Pupils: Pupils are equal, round, and reactive to light.  Cardiovascular:     Rate and Rhythm: Normal rate and regular rhythm.  Pulmonary:     Effort: Pulmonary effort is normal.     Breath sounds: Normal breath sounds.  Abdominal:     General: Abdomen is flat.     Palpations: Abdomen is soft.  Musculoskeletal:        General: Normal range of motion.  Skin:    General: Skin is warm and dry.  Neurological:     General: No focal deficit present.     Mental Status: Mental status is at baseline.  Psychiatric:        Attention and Perception: He is inattentive.        Mood and Affect: Mood is anxious. Affect is inappropriate.        Speech: Speech is tangential.        Behavior: Behavior is agitated. Behavior is not aggressive.        Thought Content: Thought content normal.        Cognition and Memory: Cognition is impaired. Memory is impaired.    Review of Systems  Unable to perform ROS: Psychiatric disorder   Blood pressure (!) 144/97, pulse 85, temperature 98.1 F (36.7 C), temperature source Oral, resp. rate 20, height 6\' 1"  (1.854 m), weight 68.9 kg, SpO2 100 %. Body mass index is 20.05 kg/m.   Treatment Plan Summary: Medication management and Plan See information above.  Not clear what the acute mental status change represents.  Head CT will be obtained to rule out intracranial process.  Vitals will be checked regularly.  Situation will be updated as information is obtained.  , MD 11/29/2020, 12:52 PM

## 2020-11-29 NOTE — BHH Counselor (Signed)
CSW spoke with Drucilla Schmidt of [facility] [phone]) regarding pt baseline. Lea stated that on a good day, when he is on all of his medication and not using illicit substances that pt walks and talks a lot. However, with all of this you can still understand what he is saying. She states that pt is typically aware of what is going on around him but he does relive a lot of stuff as well. CSW shared some of his behaviors at this facility with Lea, including a 40 minute conversation on the phone that was turned off and trying to eat/smoke a pt pen. She stated that pt is typically aware of what is edible and what is a cigarette. She went on to state that he also does not talk to people who are not there, emphasizing that there are other clients who live there that do have this behavior but he is not one of them. She reports that when he is doing well he helps out at a store near the care home. CSW inquired regarding pt medication list and when he was given his last Haldol shot. Lea stated she could send it when she got back to the facility. CSW agreed and gave her the fax number. No other concerns expressed. Contact ended without incident.   CSW notified team of conversation. After receiving medication list, CSW called to clarify when pt received his last shot. Lea stated that last shot was given on 11/15/20. This was written on the medication list and a copy was left for the provider with nurse.   Vilma Meckel. Algis Greenhouse, MSW, LCSW, LCAS 11/29/2020 4:00 PM

## 2020-11-29 NOTE — BHH Counselor (Signed)
Adult Comprehensive Assessment  Patient ID: Henry Black, male   DOB: 07-Apr-1956, 65 y.o.   MRN: [id number]  Information Source: Information source: Patient Henry Black owner, [facility] Assessment from 11/25/20)  Current Stressors:  Patient states their primary concerns and needs for treatment are:: "Playing football, doing too much..." Patient states their goals for this hospitilization and ongoing recovery are:: Pt unable to vocalize specific goal of hospitalization. 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 What is atmosphere in current home: Comfortable  Family History:  Marital status: Single Are you sexually active?: Yes What is your sexual orientation?: Heterosexual Has your sexual activity been affected by drugs, alcohol, medication, or emotional stress?: Unable to assess Does patient have children?: Yes How many children?: 1 (daughter, that Henry Black believes to be 63 years old) How is patient's relationship with their children?: Unable to assess  Childhood History:  By whom was/is the patient raised?: Henry Black Additional  childhood history information: Reports he grow up with his Henry Black and four other brothers per [facility] assessment Description of patient's relationship with caregiver when they were a child: States it was good and they were supportive Patient's description of current relationship with people who raised him/her: Henry Black shares that he speaks with his family almost daily. How were you disciplined when you got in trouble as a child/adolescent?: Unable to assess Does patient have siblings?: Yes Number of Siblings: 4 (four older brothers) Description of patient's current relationship with siblings: States they get alone well and still speak even though they are in different cities. Did patient suffer any verbal/emotional/physical/sexual abuse as a child?: No Did patient suffer from severe childhood neglect?: No Has patient ever been sexually abused/assaulted/raped as an adolescent or adult?: No Was the patient ever a victim of a crime or a disaster?: No Witnessed domestic violence?: No Has patient been affected by domestic violence as an adult?: No  Education:  Highest grade of school patient has completed: Eighth grade Currently a student?: No Learning disability?:  (unable to assess)  Employment/Work Situation:   Employment situation: On disability Why is patient on disability: Schizophrenia How long has patient been on disability: Unable to quantify Patient's job has been impacted by current illness: No What is the longest time patient has a held a job?: "Don't think he's actually had like a W-2 job", per Henry Black Where was the patient employed at that time?: N/A Has patient ever been in the [employer]?: No  Financial Resources:   Financial resources: Investment banker, operational Does patient have a Lawyer or guardian?: Yes Name of representative payee or guardian: Henry Black, Henry Black, [phone]  Alcohol/Substance Abuse:   What has been your use of  drugs/alcohol within the last 12 months?:  Henry Black reports belief that pt has been using crack/cocaine daily. If attempted suicide, did drugs/alcohol play a role in this?: No Alcohol/Substance Abuse Treatment Hx:  (Unable to assess) If yes, describe treatment: Unable to assess Has alcohol/substance abuse ever caused legal problems?:  (Unable to assess)  Social Support System:   Patient's Community Support System: Good Describe Community Support System: Family Type of faith/religion: "No" when asked if he was a relgious or spiritual person. How does patient's faith help to cope with current illness?: N/A  Leisure/Recreation:   Do You Have Hobbies?: Yes Leisure and Hobbies: "Walking" and he helps out around a store near his facility.  Strengths/Needs:   What is the patient's perception of their strengths?: Unable to assess Patient states they can use these personal strengths during their treatment to contribute to their recovery: Unable to assess Patient states these barriers may affect/interfere with their treatment: Unable to assess Patient states these barriers may affect their return to the community: Unable to assess Other important information patient would like considered in planning for their treatment: Unable to assess  Discharge Plan:   Currently receiving community mental health services: Yes (From Whom) Henry Black states they were trying to connect him with a therapist at [facility] and that Henry Clara, NP prescribes his medication.) Patient states concerns and preferences for aftercare planning are: Unable to assess Patient states they will know when they are safe and ready for discharge when: Henry Black states, "when he is back to himself." Does patient have access to transportation?: Yes (Will need transport back to [facility].) Does patient have financial barriers related to discharge medications?: No Patient description of barriers related to discharge medications:  N/A Will patient be returning to same living situation after discharge?: Yes  Summary/Recommendations:   Summary and Recommendations (to be completed by the evaluator): Patient is a 65 year old, single, father of one [place] [facility] When asked what brought him to the hospital, pt began speaking about playing basketball, issues with his feet (after a crab pinched them in the street), and Michael [place]. His ideas were very disorganized, and he was unable to answer the questions in the assessment. Pt did let CSW know that he was staying in a group home prior to admission. Pt gave verbal permission to contact his Henry Black and the group home, however, his signature was not legible. After confirming that CSW could contact his prior facility, CSW completed as much of the assessment as could be done with the help of M. Henry Black from [facility]. Pt is on disability and has both Medicaid and Medicare. CSW shared some of his behavior with Henry Black including smoking the pt pen, trying to eat the pen, and talking on the phone for 40 minutes to no one and she says that this is not typical behavior for him. Henry Black shares that he does ramble, but his statements are understandable, and he does not talk to people who are not there. She states that he walks and talks a lot but is aware of what is going on around him and even helps at a store near the care home. Henry Black reported that pt has an outpatient provider for his medications and they are trying to get him connected with therapy through [facility]. Henry Black also says that when pt gets back to baseline, he can return to their facility. Pt has a primary diagnosis of Schizophrenia. Recommendations include crisis stabilization, therapeutic milieu, encourage group attendance and participation, medication management  for detox/mood stabilization, and development of comprehensive mental wellness/sobriety plan.  Glenis Smoker. 11/29/2020

## 2020-11-29 NOTE — Progress Notes (Addendum)
POST FALL NOTE  Was the fall witnessed: No.  Patient condition before and after the fall: Patient before the unwitnessed event was observed asking staff BHT for a glass of water just in the doorway of his room and was observably in no physical distress. Pt. After the fall and interventions is observably calm, cooperative, able to follow commands, and vital signs are stable. Pt. Additionally requests to attend group with safety equipment (rolling walker) after being re-evaluated in his room. Pt. Given permission to go to group with safety interventions kept in place for safety.   Patient's reaction to the fall: Patient endorses no post-event pain.   Name of the doctor that was notified including date and time: Dr. Toni Amend was notified at 12:35pm and 13:51pm to discuss the patient's presentation and for safety planning.   Any interventions and vital signs: Patient vital signs obtained per post fall protocol (though patient endorsed not falling and event was unwitnessed). Pt. Was taken for stat CT of head.   POST FALL INCIDENT REPORT  Observed by this RN  Patient witnessed standing in the doorway of his room requesting water from staff BHT and in no physical distress just before fall incident. Patient during this time notified by staff BHT that he could receive water in the dayroom, to which patient replied yelling "I want water!". Pt. Notified again by staff BHT that he could go to the day room to receive water from the patient water machine, but patient expressed in a mumbled expression that he did not want to do this and proceeded into his room.   Pt. Then during the safety rounding performed shortly after staff BHT's conversation with patient, was found by staff Jessika BHT laying on his floor appearing to almost be sleeping (reported by BHT), and during this time Staff BHT retrieved this RN for evaluation of the patient. Patient upon evaluation by this RN was found to be almost appearing to be  sleeping, and because of this, proceeded to request staff BHT to aid this RN to help patient get into bed, before suddenly patient began to tremble in an odd manor and began moaning to himself loudly, not responding to verbal commands by this RN. This RN directed staff BHTs to obtain vital signs and POC/CBG along with notify charge RN, while this RN continued to assess the patient. Pt. During assessment was unresponsive to this writer's verbal commands, unable to follow commands, he presented tremulous, breathing fast, crying intermittently, and mumbling. Pt. Made attempts to follow this RNs fingers after several attempts of verbal prompting but performed this poorly. Pt. eyes were reactive to light and equal, but further testing difficult to obtain. Pt. Upper arms bilaterally were able to be moved with passive movement, but bilateral legs were stiff and unwilling and/or unable to bend. Pt. Additionally presented with some odd stiffness in his back. All other assessments of the patient's physical presentation were WNL. After completing an assessment of the patient, the patient's attending Dr. Toni Amend was shortly after notified in his office of the incident, and the patient's provider came to evaluate the patient. Provider placed orders for patient to receive IM medications and have a STAT CT performed. Pt. Given IM medications without complications and pt. Was taken to CT with staff BHT and this RN without complications. After CT, the pt. was taken to his room and helped into bed, with high falls risk mats placed around his bed for safety, and orders for staff BHT to perform additional vital  signs assessments of patient. After CT additionally, call was placed to the patient's attending to review patient presentation, discuss additional post incident assessment performed by this RN, and additional safety interventions that may be warranted. After speaking with provider it was determined that patient does not require  additional 1:1 safety monitoring per MD orders, but post-fall protocol of vital signs will remain in place for additional safety measures.

## 2020-11-30 MED ORDER — HALOPERIDOL 5 MG PO TABS
10.0000 mg | ORAL_TABLET | Freq: Two times a day (BID) | ORAL | Status: DC
  Administered 2020-12-01 – 2020-12-05 (×8): 10 mg via ORAL
  Filled 2020-11-30 (×8): qty 2

## 2020-11-30 NOTE — Progress Notes (Signed)
D: Pt alert and oriented. Pt denies experiencing any anxiety/depression at this time. Pt reports experiencing 10/10 lower back pain, PRN medication given, no further complaints received. Pt denies experiencing any SI/HI, or AVH at this time.   A: Scheduled medications administered to pt, per MD orders. Support and encouragement provided. Frequent verbal contact made. Routine safety checks conducted q15 minutes.   R: No adverse drug reactions noted. Pt verbally contracts for safety at this time. Pt complaint with medications. Pt interacts well with others on the unit. Pt remains safe at this time. Will continue to monitor.

## 2020-11-30 NOTE — [facility] Group Notes (Signed)
LCSW Group Therapy Note  11/30/2020 2:06 PM  Type of Therapy and Topic:  Group Therapy:  Feelings around Relapse and Recovery  Participation Level:  Did Not Attend   Description of Group:    Patients in this group will discuss emotions they experience before and after a relapse. They will process how experiencing these feelings, or avoidance of experiencing them, relates to having a relapse. Facilitator will guide patients to explore emotions they have related to recovery. Patients will be encouraged to process which emotions are more powerful. They will be guided to discuss the emotional reaction significant others in their lives may have to their relapse or recovery. Patients will be assisted in exploring ways to respond to the emotions of others without this contributing to a relapse.  Therapeutic Goals: 1. Patient will identify two or more emotions that lead to a relapse for them 2. Patient will identify two emotions that result when they relapse 3. Patient will identify two emotions related to recovery 4. Patient will demonstrate ability to communicate their needs through discussion and/or role plays   Summary of Patient Progress: Patient did not attend group despite encouraged participation.    Therapeutic Modalities:   Cognitive Behavioral Therapy Solution-Focused Therapy Assertiveness Training Relapse Prevention Therapy   Gwenevere Ghazi, MSW, [place], [place] 11/30/2020 2:06 PM

## 2020-11-30 NOTE — Progress Notes (Signed)
[facility] MD Progress Note  11/30/2020 5:07 PM Henry Black  MRN:  [id number] Subjective:   Follow-up for this patient with schizophrenia.  Patient seen and chart reviewed.  Patient continues to present as disorganized in speech and behavior.  Slightly more understandable today than he was yesterday I thought.  Not doing anything aggressive or violent.  Mostly cooperative.  We did get his list of medicine from his group home and I have increased his Haldol dose based on that to match what he is taking at home.  He is also now on a low dose of Depakote. Principal Problem: Schizophrenia, undifferentiated (HCC) Diagnosis: Principal Problem:   Schizophrenia, undifferentiated (HCC) Active Problems:   Chronic hepatitis C (HCC)   Essential hypertension  Total Time spent with patient: 30 minutes  Past Psychiatric History: Past history of schizophrenia other details lacking  Past Medical History:  Past Medical History:  Diagnosis Date  . Hepatitis   . Hypertension     Past Surgical History:  Procedure Laterality Date  . ESOPHAGOGASTRODUODENOSCOPY (EGD) WITH PROPOFOL N/A 12/21/2019   Procedure: ESOPHAGOGASTRODUODENOSCOPY (EGD) WITH PROPOFOL;  Surgeon: Toney Reil, MD;  Location: [facility] ENDOSCOPY;  Service: Gastroenterology;  Laterality: N/A;   Family History: History reviewed. No pertinent family history. Family Psychiatric  History: Limited information Social History:  Social History   Substance and Sexual Activity  Alcohol Use Yes  . Alcohol/week: 1.0 standard drink  . Types: 1 Standard drinks or equivalent per week     Social History   Substance and Sexual Activity  Drug Use Not Currently    Social History   Socioeconomic History  . Marital status: Single    Spouse name: Not on file  . Number of children: Not on file  . Years of education: Not on file  . Highest education level: Not on file  Occupational History  . Not on file  Tobacco Use  . Smoking status: Current Every  Day Smoker    Packs/day: 0.50    Years: 46.00    Pack years: 23.00    Types: Cigarettes  . Smokeless tobacco: Never Used  Vaping Use  . Vaping Use: Never used  Substance and Sexual Activity  . Alcohol use: Yes    Alcohol/week: 1.0 standard drink    Types: 1 Standard drinks or equivalent per week  . Drug use: Not Currently  . Sexual activity: Not on file  Other Topics Concern  . Not on file  Social History Narrative  . Not on file   Social Determinants of Health   Financial Resource Strain: Not on file  Food Insecurity: Not on file  Transportation Needs: Not on file  Physical Activity: Not on file  Stress: Not on file  Social Connections: Not on file   Additional Social History:                         Sleep: Fair  Appetite:  Fair  Current Medications: Current Facility-Administered Medications  Medication Dose Route Frequency Provider Last Rate Last Admin  . acetaminophen (TYLENOL) tablet 650 mg  650 mg Oral Q6H PRN Ida Milbrath, [name], MD   650 mg at 11/30/20 0804  . alum & mag hydroxide-simeth (MAALOX/MYLANTA) 200-200-20 MG/5ML suspension 30 mL  30 mL Oral Q4H PRN Kesha Hurrell T, MD      . atorvastatin (LIPITOR) tablet 20 mg  20 mg Oral Daily Lynzee Lindquist, [name], MD   20 mg at 11/30/20 0806  .  benztropine (COGENTIN) tablet 1 mg  1 mg Oral BID Facundo Allemand, [name], MD   1 mg at 11/30/20 1657  . clonazePAM (KLONOPIN) tablet 1 mg  1 mg Oral BID Kassidi Elza, [name], MD   1 mg at 11/30/20 1657  . divalproex (DEPAKOTE) DR tablet 250 mg  250 mg Oral Q12H Rider Ermis, [name], MD   250 mg at 11/30/20 0803  . [START ON 12/01/2020] haloperidol (HALDOL) tablet 10 mg  10 mg Oral BID Alcus Bradly T, MD      . haloperidol (HALDOL) tablet 2 mg  2 mg Oral Q6H PRN Daryle Amis T, MD      . hydrOXYzine (ATARAX/VISTARIL) tablet 50 mg  50 mg Oral TID PRN Debi Cousin, [name], MD   50 mg at 11/27/20 2109  . LORazepam (ATIVAN) tablet 2 mg  2 mg Oral Q6H PRN Fatih Stalvey, [name], MD   2 mg at 11/30/20 0324   Or   . LORazepam (ATIVAN) injection 2 mg  2 mg Intramuscular Q6H PRN Sissi Padia T, MD      . losartan (COZAAR) tablet 100 mg  100 mg Oral Daily Mikayah Joy, [name], MD   100 mg at 11/30/20 0803  . magnesium hydroxide (MILK OF MAGNESIA) suspension 30 mL  30 mL Oral Daily PRN Yakira Duquette T, MD      . menthol-cetylpyridinium (CEPACOL) lozenge 3 mg  1 lozenge Oral PRN Dafna Romo T, MD      . nicotine (NICODERM CQ - dosed in mg/24 hours) patch 21 mg  21 mg Transdermal Daily Laurelai Lepp, [name], MD   21 mg at 11/30/20 0806  . temazepam (RESTORIL) capsule 15 mg  15 mg Oral QHS Karryn Kosinski T, MD   15 mg at 11/29/20 2119    Lab Results:  Results for orders placed or performed during the hospital encounter of 11/26/20 (from the past 48 hour(s))  Glucose, capillary     Status: None   Collection Time: 11/29/20 12:39 PM  Result Value Ref Range   Glucose-Capillary 84 70 - 99 mg/dL    Comment: Glucose reference range applies only to samples taken after fasting for at least 8 hours.   Comment 1 Notify RN     Blood Alcohol level:  Lab Results  Component Value Date   ETH <10 11/25/2020   ETH <10 09/16/2018    Metabolic Disorder Labs: Lab Results  Component Value Date   HGBA1C 5.4 11/27/2020   MPG 108.28 11/27/2020   No results found for: PROLACTIN Lab Results  Component Value Date   CHOL 140 11/27/2020   TRIG 30 11/27/2020   HDL 61 11/27/2020   CHOLHDL 2.3 11/27/2020   VLDL 6 11/27/2020   LDLCALC 73 11/27/2020    Physical Findings: AIMS:  , ,  ,  ,    CIWA:    COWS:     Musculoskeletal: Strength & Muscle Tone: within normal limits Gait & Station: unsteady Patient leans: N/A  Psychiatric Specialty Exam:  Presentation  General Appearance: Bizarre; Disheveled  Eye Contact:Fleeting  Speech:Garbled; Pressured; Slurred  Speech Volume:Increased  Handedness:Right   Mood and Affect  Mood:-- (unable to assess)  Affect:Inappropriate; Full Range   Thought Process  Thought  Processes:Disorganized  Descriptions of Associations:Tangential  Orientation:Other (comment) (unable to assess)  Thought Content:Illogical  History of Schizophrenia/Schizoaffective disorder:Yes  Duration of Psychotic Symptoms:Greater than six months  Hallucinations:No data recorded Ideas of Reference:-- (unable to assess)  Suicidal Thoughts:No data recorded Homicidal Thoughts:No data recorded  Sensorium  Memory:No data recorded Judgment:Impaired  Insight:None   Executive Functions  Concentration:Poor  Attention Span:Poor  Recall:Poor  [employer] of Knowledge:Poor  Language:Poor   Psychomotor Activity  Psychomotor Activity:No data recorded  Assets  Assets:Other (comment)   Sleep  Sleep:No data recorded   Physical Exam: Physical Exam Vitals and nursing note reviewed.  Constitutional:      Appearance: Normal appearance.  HENT:     Head: Normocephalic and atraumatic.     Mouth/Throat:     Pharynx: Oropharynx is clear.  Eyes:     Pupils: Pupils are equal, round, and reactive to light.  Cardiovascular:     Rate and Rhythm: Normal rate and regular rhythm.  Pulmonary:     Effort: Pulmonary effort is normal.     Breath sounds: Normal breath sounds.  Abdominal:     General: Abdomen is flat.     Palpations: Abdomen is soft.  Musculoskeletal:        General: Normal range of motion.  Skin:    General: Skin is warm and dry.  Neurological:     General: No focal deficit present.     Mental Status: He is alert. Mental status is at baseline.  Psychiatric:        Attention and Perception: He is inattentive.        Mood and Affect: Mood normal. Affect is labile and inappropriate.        Speech: Speech is tangential.        Thought Content: Thought content is delusional.        Cognition and Memory: Cognition is impaired.    Review of Systems  Constitutional: Negative.   HENT: Negative.   Eyes: Negative.   Respiratory: Negative.   Cardiovascular: Negative.    Gastrointestinal: Negative.   Musculoskeletal: Negative.   Skin: Negative.   Neurological: Negative.   Psychiatric/Behavioral: Negative.    Blood pressure 134/85, pulse 82, temperature 98.8 F (37.1 C), temperature source Oral, resp. rate 18, height 6\' 1"  (1.854 m), weight 68.9 kg, SpO2 90 %. Body mass index is 20.05 kg/m.   Treatment Plan Summary: Medication management and Plan No change to medication management.  Encourage group attendance.  Continue monitoring for improvement in mental status to aid in eventual discharge which will be back to his prior group home.  , MD 11/30/2020, 5:07 PM

## 2020-12-01 DIAGNOSIS — F203 Undifferentiated schizophrenia: Secondary | ICD-10-CM | POA: Diagnosis not present

## 2020-12-01 NOTE — Progress Notes (Signed)
Patient is provided with Prn for agitation. Patient is pacing the halls and has outbursts at time. While pleasant, patient continues to disrupt milieu and peers with noise. Patient is redirectable with staff after PRN, and is determined to be effective at this time. Patient remains awake and is seen in milieu after frequent staff intervention to return to room. Staff will continue to monitor and maintain saftey.

## 2020-12-01 NOTE — Progress Notes (Signed)
Patient came out of his room, for evening medication, with a towel tucked inside of the front of his pants. This Clinical research associate asked patient why he had a towel tucked in his pants, and he stated "because I'm about to go cook". This Clinical research associate explained to patient that there isn't a kitchen nor stove on the unit, and he stated "that's ok, you got a grill though".

## 2020-12-01 NOTE — Progress Notes (Signed)
Patient has had to be redirected multiple times to use his walker while ambulating around the unit. Patient does not appear to understand why he needs it. He will pick up the walker and carry and pull it at times, and/or just leave it sitting in the middle of the floor. This writer tried to explain to patient that we don't want him to fall, and he stated "if I fall, I'll get right back up". This Clinical research associate again explained that we don't want him to fall at all, and patient smiled and picked the walker up and walked back into his room.

## 2020-12-01 NOTE — Progress Notes (Signed)
[facility] MD Progress Note  12/01/2020 8:46 AM Henry Black  MRN:  [id number]  Principal Problem: Schizophrenia, undifferentiated (HCC) Diagnosis: Principal Problem:   Schizophrenia, undifferentiated (HCC) Active Problems:   Chronic hepatitis C (HCC)   Essential hypertension  Total Time spent with patient: 15 minutes  Henry Black is a  65y.o. male patient with Schizophrenia, who presents to the [facility] unit for treatment of psychosis in the context of severe agitation and dangerous behavior.    Interval History Patient was seen today for re-evaluation.  Nursing reports no events overnight. The patient has no issues with performing ADLs.  Patient has been medication compliant. He reportedly required Prn medication for agitation last night. Patient is pacing the halls and has outbursts at time. While pleasant, patient continues to disrupt milieu and peers with noise. Patient is redirectable.   Subjective:  On assessment patient reports "I am okay". He reports  He did not sleep well due to his leg pain. He denies any mental complaints. Reports good mood, denies thoughts of harming self or others. He is still quite disorganized and unable to stay on topic in conversation.He is calm, redirectable, not aggressive or violent today. The patient reports no side effects from medications.    Labs: no new results for review.     Past Psychiatric History: see H&P   Past Medical History:  Past Medical History:  Diagnosis Date  . Hepatitis   . Hypertension     Past Surgical History:  Procedure Laterality Date  . ESOPHAGOGASTRODUODENOSCOPY (EGD) WITH PROPOFOL N/A 12/21/2019   Procedure: ESOPHAGOGASTRODUODENOSCOPY (EGD) WITH PROPOFOL;  Surgeon: Toney Reil, MD;  Location: [facility] ENDOSCOPY;  Service: Gastroenterology;  Laterality: N/A;   Family History: History reviewed. No pertinent family history. Family Psychiatric  History: see H&P  Social History:  Social History   Substance and Sexual  Activity  Alcohol Use Yes  . Alcohol/week: 1.0 standard drink  . Types: 1 Standard drinks or equivalent per week     Social History   Substance and Sexual Activity  Drug Use Not Currently    Social History   Socioeconomic History  . Marital status: Single    Spouse name: Not on file  . Number of children: Not on file  . Years of education: Not on file  . Highest education level: Not on file  Occupational History  . Not on file  Tobacco Use  . Smoking status: Current Every Day Smoker    Packs/day: 0.50    Years: 46.00    Pack years: 23.00    Types: Cigarettes  . Smokeless tobacco: Never Used  Vaping Use  . Vaping Use: Never used  Substance and Sexual Activity  . Alcohol use: Yes    Alcohol/week: 1.0 standard drink    Types: 1 Standard drinks or equivalent per week  . Drug use: Not Currently  . Sexual activity: Not on file  Other Topics Concern  . Not on file  Social History Narrative  . Not on file   Social Determinants of Health   Financial Resource Strain: Not on file  Food Insecurity: Not on file  Transportation Needs: Not on file  Physical Activity: Not on file  Stress: Not on file  Social Connections: Not on file   Additional Social History:                         Sleep: Fair  Appetite:  Fair  Current Medications: Current Facility-Administered Medications  Medication Dose Route Frequency Provider Last Rate Last Admin  . acetaminophen (TYLENOL) tablet 650 mg  650 mg Oral Q6H PRN Clapacs, [name], MD   650 mg at 11/30/20 0804  . alum & mag hydroxide-simeth (MAALOX/MYLANTA) 200-200-20 MG/5ML suspension 30 mL  30 mL Oral Q4H PRN Clapacs, [name], MD      . atorvastatin (LIPITOR) tablet 20 mg  20 mg Oral Daily Clapacs, [name], MD   20 mg at 12/01/20 0840  . benztropine (COGENTIN) tablet 1 mg  1 mg Oral BID Clapacs, [name], MD   1 mg at 12/01/20 0840  . clonazePAM (KLONOPIN) tablet 1 mg  1 mg Oral BID Clapacs, [name], MD   1 mg at 12/01/20 0840   . divalproex (DEPAKOTE) DR tablet 250 mg  250 mg Oral Q12H Clapacs, [name], MD   250 mg at 12/01/20 0840  . haloperidol (HALDOL) tablet 10 mg  10 mg Oral BID Clapacs, [name], MD   10 mg at 12/01/20 0840  . haloperidol (HALDOL) tablet 2 mg  2 mg Oral Q6H PRN Clapacs, [name], MD      . hydrOXYzine (ATARAX/VISTARIL) tablet 50 mg  50 mg Oral TID PRN Clapacs, [name], MD   50 mg at 12/01/20 0204  . LORazepam (ATIVAN) tablet 2 mg  2 mg Oral Q6H PRN Clapacs, [name], MD   2 mg at 12/01/20 0204   Or  . LORazepam (ATIVAN) injection 2 mg  2 mg Intramuscular Q6H PRN Clapacs, [name], MD      . losartan (COZAAR) tablet 100 mg  100 mg Oral Daily Clapacs, [name], MD   100 mg at 12/01/20 0840  . magnesium hydroxide (MILK OF MAGNESIA) suspension 30 mL  30 mL Oral Daily PRN Clapacs, [name], MD      . menthol-cetylpyridinium (CEPACOL) lozenge 3 mg  1 lozenge Oral PRN Clapacs, [name], MD      . nicotine (NICODERM CQ - dosed in mg/24 hours) patch 21 mg  21 mg Transdermal Daily Clapacs, [name], MD   21 mg at 12/01/20 0841  . temazepam (RESTORIL) capsule 15 mg  15 mg Oral QHS Clapacs, [name], MD   15 mg at 11/30/20 2110    Lab Results:  Results for orders placed or performed during the hospital encounter of 11/26/20 (from the past 48 hour(s))  Glucose, capillary     Status: None   Collection Time: 11/29/20 12:39 PM  Result Value Ref Range   Glucose-Capillary 84 70 - 99 mg/dL    Comment: Glucose reference range applies only to samples taken after fasting for at least 8 hours.   Comment 1 Notify RN     Blood Alcohol level:  Lab Results  Component Value Date   ETH <10 11/25/2020   ETH <10 09/16/2018    Metabolic Disorder Labs: Lab Results  Component Value Date   HGBA1C 5.4 11/27/2020   MPG 108.28 11/27/2020   No results found for: PROLACTIN Lab Results  Component Value Date   CHOL 140 11/27/2020   TRIG 30 11/27/2020   HDL 61 11/27/2020   CHOLHDL 2.3 11/27/2020   VLDL 6 11/27/2020   LDLCALC 73  11/27/2020    Physical Findings: AIMS:  , ,  ,  ,    CIWA:    COWS:     Musculoskeletal: Strength & Muscle Tone: within normal limits Gait & Station: unsteady Patient leans: N/A  Psychiatric Specialty Exam:  Presentation  General Appearance: Bizarre; Disheveled  Eye Contact:Fleeting  Speech:Garbled; Pressured; Slurred  Speech Volume:Increased  Handedness:Right   Mood and Affect  Mood: "good" Affect:Inappropriate; Full Range   Thought Process  Thought Processes:Disorganized  Descriptions of Associations:Tangential  Orientation:Other (comment) (unable to assess)  Thought Content:Illogical  History of Schizophrenia/Schizoaffective disorder:Yes  Duration of Psychotic Symptoms:Greater than six months  Hallucinations:No data recorded Ideas of Reference:-- (unable to assess)  Suicidal Thoughts: denies Homicidal Thoughts: denies  Sensorium  Memory:No data recorded Judgment:Impaired  Insight:None   Executive Functions  Concentration:Poor  Attention Span:Poor  Recall:Poor  Fund of Knowledge:Poor  Language:Poor   Psychomotor Activity  Psychomotor Activity:No data recorded  Assets  Assets:Other (comment)   Sleep  Sleep:No data recorded   Physical Exam: Physical Exam ROS Blood pressure (!) 146/87, pulse 90, temperature 98.6 F (37 C), temperature source Oral, resp. rate 17, height 6\' 1"  (1.854 m), weight 68.9 kg, SpO2 100 %. Body mass index is 20.05 kg/m.   Treatment Plan Summary: Daily contact with patient to assess and evaluate symptoms and progress in treatment and Medication management  Patient is a 65 year old male with the above-stated past psychiatric history who is seen in follow-up.  Chart reviewed. Patient discussed with nursing. Patient appears calmer and better redirectable after the dose of Haldol was increased. No aggressive/violent behaviors observed today so far.    Plan:  -continue inpatient psych admission;  15-minute checks; daily contact with patient to assess and evaluate symptoms and progress in treatment; psychoeducation.   -continue scheduled medications: Continue Haldol 10mg  PO BID for psychosis (no changes today, dose increased last night). Continue Depakote 250mg  PO Q12h for mood stabilization Continue Clonazepam 1mg  PO BID for anxiety Continue Temazepam 15mg  PO QHS for insomnia Continue Benztropine 1mg  PO BID for EPS propx.    66 atorvastatin  20 mg Oral Daily  . benztropine  1 mg Oral BID  . clonazePAM  1 mg Oral BID  . divalproex  250 mg Oral Q12H  . haloperidol  10 mg Oral BID  . losartan  100 mg Oral Daily  . nicotine  21 mg Transdermal Daily  . temazepam  15 mg Oral QHS    -continue PRN medications.  acetaminophen, alum & mag hydroxide-simeth, haloperidol, hydrOXYzine, LORazepam **OR** LORazepam, magnesium hydroxide, menthol-cetylpyridinium  -Pertinent Labs: no new labs ordered today    -Consults: No new consults placed since yesterday    -Disposition: Patent is not ready for d/c yet. Possible GH placement. All necessary aftercare will be arranged prior to discharge.  -  I certify that the patient does need, on a daily basis, active treatment furnished directly by or requiring the supervision of inpatient psychiatric facility personnel.   , MD 12/01/2020, 8:46 AM

## 2020-12-01 NOTE — [facility] Counselor (Signed)
CSW met with pt briefly to check-in. Pt stated that he was feeling good. He stated that he got goodie powder at the store and that the [place] got a budweiser. CSW encouraged pt to return to his room to get his walker. Pt began to do squats and asked the CSW to get his walker. CSW stated that pt could go get his walker and the CSW would escort him back to his room. He agreed asking the CSW hold the door open. CSW agreed. Pt held his arms out in front of him and began making airplane noises as he walked through the door. CSW pointed pt to his room. Patient began spinning around is circles slowly and then dancing. CSW saw pt to his room. No other concerns expressed. Contact ended without incident.   Chalmers Guest. Guerry Bruin, MSW, LCSW, [place] 12/01/2020 11:40 AM

## 2020-12-01 NOTE — [facility] Counselor (Signed)
LCSW Group Therapy Note   12/01/2020 10:50 AM   Type of Therapy and Topic:  Group Therapy: Avoiding Self-Sabotaging and Enabling Behaviors  Participation Level:  Did Not Attend  Description of Group:  In this group, patients will learn how to identify obstacles, self-sabotaging and enabling behaviors, as well as: what are they, why do we do them and what needs these behaviors meet. Discuss unhealthy relationships and how to have positive healthy boundaries with those that sabotage and enable. Explore aspects of self-sabotage and enabling in yourself and how to limit these self-destructive behaviors in everyday life.   Therapeutic Goals: 1. Patient will identify one obstacle that relates to self-sabotage and enabling behaviors 2. Patient will identify one personal self-sabotaging or enabling behavior they did prior to admission 3. Patient will state a plan to change the above identified behavior 4. Patient will demonstrate ability to communicate their needs through discussion and/or role play.  Summary of Patient Progress: X   Therapeutic Modalities:  Cognitive Behavioral Therapy Person-Centered Therapy Motivational Interviewing    Vilma Meckel. Algis Greenhouse, MSW, LCSW, LCAS 12/01/2020 10:50 AM

## 2020-12-01 NOTE — Plan of Care (Signed)
Patient denies SI / HI. Patient reports auditory hallucination. Patient response to internal stimuli. Patient is pleasant and cooperative to care. Staff will continue to monitor.    Problem: Education: Goal: Knowledge of [place] [facility] Education information/materials will improve Outcome: Not Progressing Goal: Emotional status will improve Outcome: Not Progressing Goal: Mental status will improve Outcome: Not Progressing

## 2020-12-01 NOTE — Progress Notes (Signed)
Patient is now in his room howling at the window. Patient continues to respond to internal stimuli and yelling out.
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
## 2020-12-02 DIAGNOSIS — F203 Undifferentiated schizophrenia: Secondary | ICD-10-CM | POA: Diagnosis not present

## 2020-12-02 MED ORDER — DIVALPROEX SODIUM 250 MG PO DR TAB
250.0000 mg | DELAYED_RELEASE_TABLET | Freq: Every morning | ORAL | Status: DC
  Administered 2020-12-03 – 2020-12-05 (×3): 250 mg via ORAL
  Filled 2020-12-02 (×3): qty 1

## 2020-12-02 MED ORDER — DIVALPROEX SODIUM 500 MG PO DR TAB
500.0000 mg | DELAYED_RELEASE_TABLET | Freq: Every day | ORAL | Status: DC
  Administered 2020-12-02 – 2020-12-11 (×10): 500 mg via ORAL
  Filled 2020-12-02 (×10): qty 1

## 2020-12-02 NOTE — Progress Notes (Signed)
[facility] MD Progress Note  12/02/2020 1:16 PM Henry Black  MRN:  [id number]  Principal Problem: Schizophrenia, undifferentiated (HCC) Diagnosis: Principal Problem:   Schizophrenia, undifferentiated (HCC) Active Problems:   Chronic hepatitis C (HCC)   Essential hypertension  Total Time spent with patient: 15 minutes  Henry Black is a  65y.o. male patient with Schizophrenia, who presents to the [facility] unit for treatment of psychosis in the context of severe agitation and dangerous behavior.    Interval History Patient was seen today for re-evaluation.  Nursing reports no events overnight. The patient has no issues with performing ADLs.  Patient has been medication compliant. Patient continues to pace hallways and has noisy outbursts at time. Patient is redirectable.   Subjective:  On assessment patient reports "I am okay". He urinated on himself in sleep, he is upset and asks for change of clothes. Reports good mood prior to the incident, denies thoughts of harming self or others. He is still quite disorganized and unable to stay on topic in conversation.He is calm, redirectable, not aggressive or violent today. The patient reports no side effects from medications.    Labs: no new results for review.     Past Psychiatric History: see H&P   Past Medical History:  Past Medical History:  Diagnosis Date  . Hepatitis   . Hypertension     Past Surgical History:  Procedure Laterality Date  . ESOPHAGOGASTRODUODENOSCOPY (EGD) WITH PROPOFOL N/A 12/21/2019   Procedure: ESOPHAGOGASTRODUODENOSCOPY (EGD) WITH PROPOFOL;  Surgeon: Toney Reil, MD;  Location: [facility] ENDOSCOPY;  Service: Gastroenterology;  Laterality: N/A;   Family History: History reviewed. No pertinent family history. Family Psychiatric  History: see H&P  Social History:  Social History   Substance and Sexual Activity  Alcohol Use Yes  . Alcohol/week: 1.0 standard drink  . Types: 1 Standard drinks or equivalent per  week     Social History   Substance and Sexual Activity  Drug Use Not Currently    Social History   Socioeconomic History  . Marital status: Single    Spouse name: Not on file  . Number of children: Not on file  . Years of education: Not on file  . Highest education level: Not on file  Occupational History  . Not on file  Tobacco Use  . Smoking status: Current Every Day Smoker    Packs/day: 0.50    Years: 46.00    Pack years: 23.00    Types: Cigarettes  . Smokeless tobacco: Never Used  Vaping Use  . Vaping Use: Never used  Substance and Sexual Activity  . Alcohol use: Yes    Alcohol/week: 1.0 standard drink    Types: 1 Standard drinks or equivalent per week  . Drug use: Not Currently  . Sexual activity: Not on file  Other Topics Concern  . Not on file  Social History Narrative  . Not on file   Social Determinants of Health   Financial Resource Strain: Not on file  Food Insecurity: Not on file  Transportation Needs: Not on file  Physical Activity: Not on file  Stress: Not on file  Social Connections: Not on file   Additional Social History:                         Sleep: Fair  Appetite:  Fair  Current Medications: Current Facility-Administered Medications  Medication Dose Route Frequency Provider Last Rate Last Admin  . acetaminophen (TYLENOL) tablet 650 mg  650  mg Oral Q6H PRN Clapacs, [name], MD   650 mg at 11/30/20 0804  . alum & mag hydroxide-simeth (MAALOX/MYLANTA) 200-200-20 MG/5ML suspension 30 mL  30 mL Oral Q4H PRN Clapacs, [name], MD      . atorvastatin (LIPITOR) tablet 20 mg  20 mg Oral Daily Clapacs, [name], MD   20 mg at 12/02/20 0802  . benztropine (COGENTIN) tablet 1 mg  1 mg Oral BID Clapacs, [name], MD   1 mg at 12/02/20 0802  . clonazePAM (KLONOPIN) tablet 1 mg  1 mg Oral BID Clapacs, [name], MD   1 mg at 12/02/20 0802  . divalproex (DEPAKOTE) DR tablet 250 mg  250 mg Oral Q12H Clapacs, [name], MD   250 mg at 12/02/20 0802  .  haloperidol (HALDOL) tablet 10 mg  10 mg Oral BID Clapacs, [name], MD   10 mg at 12/02/20 0802  . haloperidol (HALDOL) tablet 2 mg  2 mg Oral Q6H PRN Clapacs, [name], MD      . hydrOXYzine (ATARAX/VISTARIL) tablet 50 mg  50 mg Oral TID PRN Clapacs, [name], MD   50 mg at 12/02/20 1035  . LORazepam (ATIVAN) tablet 2 mg  2 mg Oral Q6H PRN Clapacs, [name], MD   2 mg at 12/02/20 1035   Or  . LORazepam (ATIVAN) injection 2 mg  2 mg Intramuscular Q6H PRN Clapacs, [name], MD      . losartan (COZAAR) tablet 100 mg  100 mg Oral Daily Clapacs, [name], MD   100 mg at 12/02/20 0802  . magnesium hydroxide (MILK OF MAGNESIA) suspension 30 mL  30 mL Oral Daily PRN Clapacs, [name], MD      . menthol-cetylpyridinium (CEPACOL) lozenge 3 mg  1 lozenge Oral PRN Clapacs, [name], MD      . nicotine (NICODERM CQ - dosed in mg/24 hours) patch 21 mg  21 mg Transdermal Daily Clapacs, [name], MD   21 mg at 12/02/20 0803  . temazepam (RESTORIL) capsule 15 mg  15 mg Oral QHS Clapacs, [name], MD   15 mg at 12/01/20 2125    Lab Results:  No results found for this or any previous visit (from the past 48 hour(s)).  Blood Alcohol level:  Lab Results  Component Value Date   ETH <10 11/25/2020   ETH <10 09/16/2018    Metabolic Disorder Labs: Lab Results  Component Value Date   HGBA1C 5.4 11/27/2020   MPG 108.28 11/27/2020   No results found for: PROLACTIN Lab Results  Component Value Date   CHOL 140 11/27/2020   TRIG 30 11/27/2020   HDL 61 11/27/2020   CHOLHDL 2.3 11/27/2020   VLDL 6 11/27/2020   LDLCALC 73 11/27/2020    Physical Findings: AIMS:  , ,  ,  ,    CIWA:    COWS:     Musculoskeletal: Strength & Muscle Tone: within normal limits Gait & Station: unsteady Patient leans: N/A  Psychiatric Specialty Exam:  Presentation  General Appearance: Bizarre; Disheveled  Eye Contact:Fleeting  Speech:Garbled; Pressured; Slurred  Speech Volume:Increased  Handedness:Right   Mood and Affect  Mood:  "good" Affect:Inappropriate; Full Range   Thought Process  Thought Processes:Disorganized  Descriptions of Associations:Tangential  Orientation:Other (comment) (unable to assess)  Thought Content:Illogical  History of Schizophrenia/Schizoaffective disorder:Yes  Duration of Psychotic Symptoms:Greater than six months  Hallucinations:No data recorded Ideas of Reference:-- (unable to assess)  Suicidal Thoughts: denies Homicidal Thoughts:  denies  Sensorium  Memory:No data recorded Judgment:Impaired  Insight:None   Executive Functions  Concentration:Poor  Attention Span:Poor  Recall:Poor  Fund of Knowledge:Poor  Language:Poor   Psychomotor Activity  Psychomotor Activity:No data recorded  Assets  Assets:Other (comment)   Sleep  Sleep:No data recorded   Physical Exam: Physical Exam  ROS  Blood pressure 109/72, pulse 87, temperature 97.7 F (36.5 C), temperature source Oral, resp. rate 18, height 6\' 1"  (1.854 m), weight 68.9 kg, SpO2 100 %. Body mass index is 20.05 kg/m.   Treatment Plan Summary: Daily contact with patient to assess and evaluate symptoms and progress in treatment and Medication management  Patient is a 65 year old male with the above-stated past psychiatric history who is seen in follow-up.  Chart reviewed. Patient discussed with nursing. Patient appears calmer and better redirectable after the dose of Haldol was increased. Will increase PM dose of Depakote today for mood stabilization. No aggressive/violent behaviors observed today so far.     Plan:  -continue inpatient psych admission; 15-minute checks; daily contact with patient to assess and evaluate symptoms and progress in treatment; psychoeducation.   -continue scheduled medications: Continue Haldol 10mg  PO BID for psychosis (no changes today, dose increased last night). Continue Depakote 250mg  PO QAM and increase to 500mg  PO QHS for mood stabilization. Will order level for  5/10. Continue Clonazepam 1mg  PO BID for anxiety Continue Temazepam 15mg  PO QHS for insomnia Continue Benztropine 1mg  PO BID for EPS propx.    66 atorvastatin  20 mg Oral Daily  . benztropine  1 mg Oral BID  . clonazePAM  1 mg Oral BID  . [START ON 12/03/2020] divalproex  250 mg Oral q morning  . divalproex  500 mg Oral QHS  . haloperidol  10 mg Oral BID  . losartan  100 mg Oral Daily  . nicotine  21 mg Transdermal Daily  . temazepam  15 mg Oral QHS     -continue PRN medications.  acetaminophen, alum & mag hydroxide-simeth, haloperidol, hydrOXYzine, LORazepam **OR** LORazepam, magnesium hydroxide, menthol-cetylpyridinium  -Pertinent Labs: no new labs ordered today    -Consults: No new consults placed since yesterday    -Disposition: Patent is not ready for d/c yet. Possible GH placement. All necessary aftercare will be arranged prior to discharge.  -  I certify that the patient does need, on a daily basis, active treatment furnished directly by or requiring the supervision of inpatient psychiatric facility personnel.   , MD 12/02/2020, 1:16 PM

## 2020-12-02 NOTE — Progress Notes (Signed)
Patient was cooperative with treatment, he remains disorganized although, loud and tangential at times and  he was compliant with medication regime, he denies SI & AV/VH.

## 2020-12-02 NOTE — Progress Notes (Signed)
Patient continues to stand in his room, as well as, walk out into the hallway talking loudly, responding to internal stimuli. This Clinical research associate administered PRN medication for anxiety. Patient tolerated medications well and came out into the hallway looking into the nurses station smiling. He proceeds to take a bow, stand up straight, turn to his left side and repeat. Patient has been informed to use his walker and to be careful, however, he needs frequent redirection from staff.

## 2020-12-02 NOTE — Plan of Care (Signed)
D- Patient alert and oriented. Patient presents in a preoccupied, but pleasant mood on assessment stating that he slept "good" last night, in which it was reported that he slept about 4.5 hours last night. Patient does endorse AVH, stating that he sees things "that come alive", and he hears a "deep voice". Patient denies SI, HI, and pain at this time. Patient also denies any signs/symptoms of depression and anxiety. Patient had no stated goals for today.  A- Scheduled medications administered to patient, per MD orders. Support and encouragement provided.  Routine safety checks conducted every 15 minutes.  Patient informed to notify staff with problems or concerns.  R- No adverse drug reactions noted. Patient contracts for safety at this time. Patient compliant with medications. Patient remains safe at this time.  Problem: Education: Goal: Knowledge of [place] [facility] Education information/materials will improve Outcome: Not Progressing Goal: Emotional status will improve Outcome: Not Progressing Goal: Mental status will improve Outcome: Not Progressing Goal: Verbalization of understanding the information provided will improve Outcome: Not Progressing   Problem: Safety: Goal: Periods of time without injury will increase Outcome: Not Progressing   Problem: Activity: Goal: Will verbalize the importance of balancing activity with adequate rest periods Outcome: Not Progressing   Problem: Safety: Goal: Ability to redirect hostility and anger into socially appropriate behaviors will improve Outcome: Not Progressing Goal: Ability to remain free from injury will improve Outcome: Not Progressing

## 2020-12-02 NOTE — Progress Notes (Signed)
Patient has had about three different instances where he has urinated on himself. Patient is able to perform his ADLs independently, however, this writer feels that sometimes he doesn't make it to the restroom in time. Patient has been provided with a pair of scrub pants and mesh underwear to change.

## 2020-12-02 NOTE — Progress Notes (Signed)
Patient is walking out of the room, using the bedside commode, as if it were his walker. This writer explained to patient that the commode is not the walker and directed him to his walker to use. Patient came out of the room with the walker and walked to the double doors by the nurses station and left the walker at the doors, and went back into his room.

## 2020-12-03 MED ORDER — AMLODIPINE BESYLATE 5 MG PO TABS
5.0000 mg | ORAL_TABLET | Freq: Every day | ORAL | Status: DC
  Administered 2020-12-03 – 2020-12-12 (×9): 5 mg via ORAL
  Filled 2020-12-03 (×10): qty 1

## 2020-12-03 MED ORDER — HALOPERIDOL 5 MG PO TABS
5.0000 mg | ORAL_TABLET | Freq: Once | ORAL | Status: AC
  Administered 2020-12-03: 5 mg via ORAL
  Filled 2020-12-03: qty 1

## 2020-12-03 MED ORDER — ZIPRASIDONE MESYLATE 20 MG IM SOLR
20.0000 mg | Freq: Once | INTRAMUSCULAR | Status: AC
  Administered 2020-12-03: 20 mg via INTRAMUSCULAR
  Filled 2020-12-03: qty 20

## 2020-12-03 MED ORDER — HALOPERIDOL 5 MG PO TABS
5.0000 mg | ORAL_TABLET | Freq: Four times a day (QID) | ORAL | Status: DC | PRN
  Administered 2020-12-04 – 2020-12-05 (×3): 5 mg via ORAL
  Filled 2020-12-03 (×3): qty 1

## 2020-12-03 MED ORDER — HALOPERIDOL LACTATE 5 MG/ML IJ SOLN: 5.0000 mg | Freq: Once | INTRAMUSCULAR | Status: AC

## 2020-12-03 NOTE — Progress Notes (Addendum)
Pt 1:1 due to becoming a fall risk/sedation and increased agitation. Pt is currently in his room in bed with safety at bedside. Pt is safe at this time. Will continue to monitor 1:1 continued.
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
## 2020-12-03 NOTE — Progress Notes (Signed)
Recreation Therapy Notes  INPATIENT RECREATION TR PLAN  Patient Details Name: Henry Black MRN: [id number] DOB: 1955/10/06 Today's Date: 12/03/2020  Rec Therapy Plan Is patient appropriate for Therapeutic Recreation?: Yes Treatment times per week: At least 3 Estimated Length of Stay: 5-7 days TR Treatment/Interventions: Group participation (Comment)  Discharge Criteria Pt will be discharged from therapy if:: Discharged Treatment plan/goals/alternatives discussed and agreed upon by:: Patient/family  Discharge Summary     Henry Black 12/03/2020, 3:14 PM

## 2020-12-03 NOTE — Tx Team (Signed)
Interdisciplinary Treatment and Diagnostic Plan Update  12/03/2020 Time of Session: 8:30am Henry Black MRN: [id number]  Principal Diagnosis: Schizophrenia, undifferentiated (HCC)  Secondary Diagnoses: Principal Problem:   Schizophrenia, undifferentiated (HCC) Active Problems:   Chronic hepatitis C (HCC)   Essential hypertension   Current Medications:  Current Facility-Administered Medications  Medication Dose Route Frequency Provider Last Rate Last Admin  . acetaminophen (TYLENOL) tablet 650 mg  650 mg Oral Q6H PRN Clapacs, [name], MD   650 mg at 11/30/20 0804  . alum & mag hydroxide-simeth (MAALOX/MYLANTA) 200-200-20 MG/5ML suspension 30 mL  30 mL Oral Q4H PRN Clapacs, [name], MD      . atorvastatin (LIPITOR) tablet 20 mg  20 mg Oral Daily Clapacs, [name], MD   20 mg at 12/03/20 0757  . benztropine (COGENTIN) tablet 1 mg  1 mg Oral BID Clapacs, [name], MD   1 mg at 12/03/20 0757  . clonazePAM (KLONOPIN) tablet 1 mg  1 mg Oral BID Clapacs, [name], MD   1 mg at 12/03/20 0757  . divalproex (DEPAKOTE) DR tablet 250 mg  250 mg Oral q morning Paliy, Alisa, MD      . divalproex (DEPAKOTE) DR tablet 500 mg  500 mg Oral QHS Paliy, [name], MD   500 mg at 12/02/20 2103  . haloperidol (HALDOL) tablet 10 mg  10 mg Oral BID Clapacs, [name], MD   10 mg at 12/03/20 0756  . haloperidol (HALDOL) tablet 2 mg  2 mg Oral Q6H PRN Clapacs, [name], MD   2 mg at 12/03/20 0916  . hydrOXYzine (ATARAX/VISTARIL) tablet 50 mg  50 mg Oral TID PRN Clapacs, [name], MD   50 mg at 12/02/20 1035  . LORazepam (ATIVAN) tablet 2 mg  2 mg Oral Q6H PRN Clapacs, [name], MD   2 mg at 12/03/20 9798   Or  . LORazepam (ATIVAN) injection 2 mg  2 mg Intramuscular Q6H PRN Clapacs, [name], MD      . losartan (COZAAR) tablet 100 mg  100 mg Oral Daily Clapacs, [name], MD   100 mg at 12/03/20 0800  . magnesium hydroxide (MILK OF MAGNESIA) suspension 30 mL  30 mL Oral Daily PRN Clapacs, [name], MD      . menthol-cetylpyridinium  (CEPACOL) lozenge 3 mg  1 lozenge Oral PRN Clapacs, [name], MD      . nicotine (NICODERM CQ - dosed in mg/24 hours) patch 21 mg  21 mg Transdermal Daily Clapacs, [name], MD   21 mg at 12/03/20 0800  . temazepam (RESTORIL) capsule 15 mg  15 mg Oral QHS Clapacs, [name], MD   15 mg at 12/02/20 2103   PTA Medications: Medications Prior to Admission  Medication Sig Dispense Refill Last Dose  . atorvastatin (LIPITOR) 20 MG tablet Take 20 mg by mouth daily.     . benztropine (COGENTIN) 2 MG tablet Take 2 mg by mouth 2 (two) times daily.     . Cholecalciferol 2000 units CAPS Take 2,000 Units by mouth daily.     . clonazePAM (KLONOPIN) 1 MG tablet Take 1 mg by mouth 2 (two) times daily.     . haloperidol decanoate (HALDOL DECANOATE) 100 MG/ML injection Inject 150 mg into the muscle every 28 (twenty-eight) days.     Marland Kitchen losartan (COZAAR) 100 MG tablet Take 100 mg by mouth daily.     . LUCEMYRA 0.18 MG TABS      . Sofosbuvir-Velpatasvir (EPCLUSA)  400-100 MG TABS Take 1 tablet by mouth daily. 28 tablet 2     Patient Stressors: Educational concerns Medication change or noncompliance  Patient Strengths: Physical Health Supportive family/friends  Treatment Modalities: Medication Management, Group therapy, Case management,  1 to 1 session with clinician, Psychoeducation, Recreational therapy.   Physician Treatment Plan for Primary Diagnosis: Schizophrenia, undifferentiated (HCC) Long Term Goal(s): Improvement in symptoms so as ready for discharge Improvement in symptoms so as ready for discharge   Short Term Goals: Ability to verbalize feelings will improve Ability to demonstrate self-control will improve Ability to identify and develop effective coping behaviors will improve Ability to maintain clinical measurements within normal limits will improve Compliance with prescribed medications will improve  Medication Management: Evaluate patient's response, side effects, and tolerance of medication  regimen.  Therapeutic Interventions: 1 to 1 sessions, Unit Group sessions and Medication administration.  Evaluation of Outcomes: Progressing  Physician Treatment Plan for Secondary Diagnosis: Principal Problem:   Schizophrenia, undifferentiated (HCC) Active Problems:   Chronic hepatitis C (HCC)   Essential hypertension  Long Term Goal(s): Improvement in symptoms so as ready for discharge Improvement in symptoms so as ready for discharge   Short Term Goals: Ability to verbalize feelings will improve Ability to demonstrate self-control will improve Ability to identify and develop effective coping behaviors will improve Ability to maintain clinical measurements within normal limits will improve Compliance with prescribed medications will improve     Medication Management: Evaluate patient's response, side effects, and tolerance of medication regimen.  Therapeutic Interventions: 1 to 1 sessions, Unit Group sessions and Medication administration.  Evaluation of Outcomes: Progressing   RN Treatment Plan for Primary Diagnosis: Schizophrenia, undifferentiated (HCC) Long Term Goal(s): Knowledge of disease and therapeutic regimen to maintain health will improve  Short Term Goals: Ability to remain free from injury will improve, Ability to verbalize frustration and anger appropriately will improve, Ability to demonstrate self-control, Ability to participate in decision making will improve, Ability to verbalize feelings will improve, Ability to identify and develop effective coping behaviors will improve and Compliance with prescribed medications will improve  Medication Management: RN will administer medications as ordered by provider, will assess and evaluate patient's response and provide education to patient for prescribed medication. RN will report any adverse and/or side effects to prescribing provider.  Therapeutic Interventions: 1 on 1 counseling sessions, Psychoeducation, Medication  administration, Evaluate responses to treatment, Monitor vital signs and CBGs as ordered, Perform/monitor CIWA, COWS, AIMS and Fall Risk screenings as ordered, Perform wound care treatments as ordered.  Evaluation of Outcomes: Progressing   LCSW Treatment Plan for Primary Diagnosis: Schizophrenia, undifferentiated (HCC) Long Term Goal(s): Safe transition to appropriate next level of care at discharge, Engage patient in therapeutic group addressing interpersonal concerns.  Short Term Goals: Engage patient in aftercare planning with referrals and resources, Increase social support, Increase ability to appropriately verbalize feelings, Increase emotional regulation, Facilitate acceptance of mental health diagnosis and concerns, Facilitate patient progression through stages of change regarding substance use diagnoses and concerns, Identify triggers associated with mental health/substance abuse issues and Increase skills for wellness and recovery  Therapeutic Interventions: Assess for all discharge needs, 1 to 1 time with Social worker, Explore available resources and support systems, Assess for adequacy in community support network, Educate family and significant other(s) on suicide prevention, Complete Psychosocial Assessment, Interpersonal group therapy.  Evaluation of Outcomes: Progressing   Progress in Treatment: Attending groups: No. Participating in groups: No. Taking medication as prescribed: Yes. Toleration medication: Yes. Family/Significant  other contact made: Yes, individual(s) contacted:  Drucilla Schmidt, [facility] staff member. Patient understands diagnosis: No. Discussing patient identified problems/goals with staff: No. Medical problems stabilized or resolved: Yes. Denies suicidal/homicidal ideation: Yes. Issues/concerns per patient self-inventory: No. Other: None.  New problem(s) identified: No, Describe:  none.  New Short Term/Long Term Goal(s): elimination of  symptoms of psychosis, medication management for mood stabilization; elimination of SI thoughts; development of comprehensive mental wellness/sobriety plan. Update 12/03/20: No changes at this time.   Patient Goals: "To get these wet clothes off." Pt was reported to have been incontinent this morning but had been doing better. Staff to remind pt to utilize the bathroom. Update 12/03/20: Pt continues to have some incontinence. He was agitated this morning and given medication to help him calm down.     Discharge Plan or Barriers: CSW will assist with development of an appropriate discharge plan. Update 12/03/20: CSW confirmed that pt can return to his care home facility once he is at baseline.  Reason for Continuation of Hospitalization: Medication stabilization  Estimated Length of Stay: TBD  Recreational Therapy: Patient Stressors: N/A Patient Goal: Patient will engage in groups without prompting or encouragement from LRT x3 group sessions within 5 recreation therapy group sessions.  Attendees: Patient:  12/03/2020 9:27 AM  Physician: Les Pou, MD 12/03/2020 9:27 AM  Nursing:  12/03/2020 9:27 AM  RN Care Manager: 12/03/2020 9:27 AM  Social Worker: Vilma Meckel. Algis Greenhouse, MSW, [place], LCAS 12/03/2020 9:27 AM  Recreational Therapist:  12/03/2020 9:27 AM  Other: Kiva [place], MSW, LCSW-A 12/03/2020 9:27 AM  Other: Gwenevere Ghazi, MSW, [place], Bridget Hartshorn 12/03/2020 9:27 AM  Other: 12/03/2020 9:27 AM    Scribe for Treatment Team: Glenis Smoker, LCSW 12/03/2020 9:27 AM

## 2020-12-03 NOTE — Progress Notes (Signed)
D: Pt. Alert and oriented. Pt denies experiencing any anxiety/depression/pain at this time. Pt. denies experiencing SI/HI, or AVH at this time.   Pt observed to have anxiety and loud outbursts. Including destroying his pillows and removing linen from his bed. Pt had two incontinent episodes this afternoon. Pt put his hand in mouth and forced self to vomit stating he had a bird stuck in his mouth. Pt has been increasingly agitated and difficult to redirect.  A: Scheduled medications and prn administered to pt per MD orders. Support and encouragement provided. Frequent verbal contact made. Routine safety checks conducted q15 minutes.  R: No adverse drug reaction noted. Pt. verbally contracts for safety at this time. Pt complaint about being here and ready to leave. Pt remains safe at this time. Will continue 1:1.

## 2020-12-03 NOTE — Progress Notes (Signed)
Alerted by staff that patient has vomited in his room. Patient was seated on bedside commode facing toilet with with nursing assistance to prevent falls. There is clear emesis that appears to be water. Patient yelling that there is a bird in his mouth. He is observed sticking his entire hand in his mouth down to his fist in effort to force emesis. Patient eventually redirected to stop this behavior. He denies any nausea or upset stomach. He is offered pepto bismol, but he declines requesting cold water. He does transition to bed, but continues to yell out. He screams that he will kill an unknown intity in the room for calling him a "faggot." He does allow team to check his vital signs. Blood pressure elevated likely in the setting of acute agitation and increased activity. He also allows team to administer IM Geodon for agitation. Will continue to monitor.

## 2020-12-03 NOTE — [facility] Group Notes (Signed)
LCSW Group Therapy Note   12/03/2020 3:34 PM  Type of Therapy and Topic:  Group Therapy:  Overcoming Obstacles   Participation Level:  Did Not Attend   Description of Group:    In this group patients will be encouraged to explore what they see as obstacles to their own wellness and recovery. They will be guided to discuss their thoughts, feelings, and behaviors related to these obstacles. The group will process together ways to cope with barriers, with attention given to specific choices patients can make. Each patient will be challenged to identify changes they are motivated to make in order to overcome their obstacles. This group will be process-oriented, with patients participating in exploration of their own experiences as well as giving and receiving support and challenge from other group members.   Therapeutic Goals: 1. Patient will identify personal and current obstacles as they relate to admission. 2. Patient will identify barriers that currently interfere with their wellness or overcoming obstacles.  3. Patient will identify feelings, thought process and behaviors related to these barriers. 4. Patient will identify two changes they are willing to make to overcome these obstacles:      Summary of Patient Progress X   Therapeutic Modalities:   Cognitive Behavioral Therapy Solution Focused Therapy Motivational Interviewing Relapse Prevention Therapy  Simona Huh R. Algis Greenhouse, MSW, LCSW, LCAS 12/03/2020 3:34 PM

## 2020-12-03 NOTE — Progress Notes (Signed)
Recreation Therapy Notes  Date: 12/03/2020  Time: 10:00 am   Location: Craft room   Behavioral response: Inappropriate  Intervention Topic: Stress Management    Discussion/Intervention:  Group content on today was focused on stress. The group defined stress and way to cope with stress. Participants expressed how they know when they are stresses out. Individuals described the different ways they have to cope with stress. The group stated reasons why it is important to cope with stress. Patient explained what good stress is and some examples. The group participated in the intervention "Stress Management". Individuals were separated into two group and answered questions related to stress.  Clinical Observations/Feedback: Patient came to group late and was in and out of group. He need much redirection to stay on topic, not write on peers papers and not talk over others. Participant left group yelling and cursing for unknown reasons; while dragging his walker behind him on the way out.  Yisrael Obryan LRT/CTRS         [place] Coach 12/03/2020 12:26 PM

## 2020-12-03 NOTE — Progress Notes (Signed)
Recreation Therapy Notes  INPATIENT RECREATION THERAPY ASSESSMENT  Patient Details Name: Henry Black MRN: [id number] DOB: [birth year] Today's Date: 12/03/2020       Information Obtained From: Patient (Chart review)  Able to Participate in Assessment/Interview:    Patient Presentation: Responsive,Disheveled,Hyperverbal  Reason for Admission (Per Patient): Active Symptoms  Patient Stressors:    Coping Skills:   Talk,Music  Leisure Interests (2+):  Exercise - [employer] - Listen,Music - Singing,Sports - Football,Sports - Dance,Sports - Basketball  Frequency of Recreation/Participation: Weekly  Awareness of Community Resources:     [employer]:     Current Use:    If no, Barriers?:    Expressed Interest in [employer] Information:    [employer] of Residence:  Film/video editor  Patient Main Form of Transportation: Other (Comment) (Group home)  Patient Strengths:  Basketball  Patient Identified Areas of Improvement:  N/A  Patient Goal for Hospitalization:  N/A  Current SI (including self-harm):     Current HI:     Current AVH:    Staff Intervention Plan: Group Attendance,Collaborate with Interdisciplinary Treatment Team  Consent to Intern Participation: N/A  Henry Black 12/03/2020, 3:13 PM

## 2020-12-03 NOTE — Progress Notes (Signed)
Pt is in room sleeping with safety sitter at bedside. Pt does not appear to be in any distress at this time. Pt is safe at this time. Will continue 1:1.

## 2020-12-04 LAB — VALPROIC ACID LEVEL: Valproic Acid Lvl: 40 ug/mL — ABNORMAL LOW (ref 50.0–100.0)

## 2020-12-04 NOTE — Progress Notes (Signed)
Pt alert and oriented. Pt walking with walker down the hall with safety sitter. Returned to room to continue 1:1 monitoring.

## 2020-12-04 NOTE — Progress Notes (Signed)
D: Pt. Alert and oriented. Pt requested medication for sleep and medication was helpful and was able to sleep well. Denies pain, anxiety, or depression. Pt denies experiencing SI/HI, or AVH at this time.  A: Scheduled medications, prn administered to pt, per MD orders. Support and encouragement provided. Frequent verbal contact made. Routine safety checks conducted q15 minutes.  R: No adverse drug reaction noted. Pt verbally contracts for safety at this time. Pt remains safe at this time. Will continue to monitor 1:1.

## 2020-12-04 NOTE — Progress Notes (Signed)
Pt is alert and oriented. Pt denies pain, depression, or anxiety. Pt remains safe at this time. Safety sitter at bedside. Will continue to monitor 1:1.

## 2020-12-04 NOTE — [facility] Group Notes (Signed)
LCSW Group Therapy Note     12/04/2020 1:43 PM     Type of Therapy/Topic:  Group Therapy:  Feelings about Diagnosis     Participation Level:  Active     Description of Group:   This group will allow patients to explore their thoughts and feelings about diagnoses they have received. Patients will be guided to explore their level of understanding and acceptance of these diagnoses. Facilitator will encourage patients to process their thoughts and feelings about the reactions of others to their diagnosis and will guide patients in identifying ways to discuss their diagnosis with significant others in their lives. This group will be process-oriented, with patients participating in exploration of their own experiences, giving and receiving support, and processing challenge from other group members.        Therapeutic Goals:  1.    Patient will demonstrate understanding of diagnosis as evidenced by identifying two or more symptoms of the disorder  2.    Patient will be able to express two feelings regarding the diagnosis  3.    Patient will demonstrate their ability to communicate their needs through discussion and/or role play     Summary of Patient Progress: Patient was present for majority of the group session. Patient was an active listener and participated in the topic of discussion, and added nuance to topic of conversation when he was able.   Pt stated that he did have a diagnosis and gave a couple of symptoms that he experienced.   Therapeutic Modalities:   Cognitive Behavioral Therapy  Brief Therapy  Feelings Identification    Jakara Blatter [place], MSW, LCSW-A  12/04/2020 1:43 PM

## 2020-12-04 NOTE — Progress Notes (Signed)
Patient was cooperative with treatment, he remains on 1:1 for safety, he remains disorganized although, he had episodes on incontinence on shift offered diaper, loud and tangential at times and  he was compliant with medication regime, he denies SI & AV/VH.

## 2020-12-04 NOTE — Progress Notes (Signed)
Pt was cooperative with medication administration. Pt currently denies SI and AV/VH. Safety sitter at bedside. Pt does not appear to be in any distress at this time. Pt is safe at this time. Will continue 1:1

## 2020-12-04 NOTE — Progress Notes (Addendum)
Evening meds not given, MD aware and ordered to hold while pt is sedated. MD stated meds can be given once pt awakens with the exception of the Haldol which is not to be given. Information passed onto night shift.

## 2020-12-04 NOTE — Progress Notes (Signed)
Recreation Therapy Notes    Date: 12/04/2020  Time: 9:45am   Location: Craft room  Behavioral response: N/A   Intervention Topic: Coping Skills    Discussion/Intervention: Patient did not attend group.   Clinical Observations/Feedback:  Patient did not attend group.   Marget Outten LRT/CTRS        Henry Black 12/04/2020 12:09 PM 

## 2020-12-04 NOTE — Progress Notes (Signed)
[facility] MD Progress Note  12/04/2020 10:18 AM Henry Black  MRN:  [id number]  Principal Problem: Schizophrenia, undifferentiated (HCC) Diagnosis: Principal Problem:   Schizophrenia, undifferentiated (HCC) Active Problems:   Chronic hepatitis C (HCC)   Essential hypertension  Total Time spent with patient: 20 minutes  Henry Black is a  65y.o. male patient with Schizophrenia, who presents to the [facility] unit for treatment of psychosis in the context of severe agitation and dangerous behavior.    Interval History Patient was seen today for re-evaluation.  Nursing reports some episodes of incontinence overnight. The patient has minor issues with performing ADLs.  Patient has been medication compliant. Patient remains on 1:1 since yesterday afternoon due to bizarre behavior, angry outbursts, and high falls risk.   Subjective:  On assessment patient is responding to internal stimuli, and voice alternates from whisper to yell. He is unable to participate in exam today due to gross disorganization. He has taken his medications, and eaten breakfast. Appears somewhat somnolent this morning likely secondary to PRN medications yesterday.    Labs: no new results for review.  Past Psychiatric History: see H&P   Past Medical History:  Past Medical History:  Diagnosis Date  . Hepatitis   . Hypertension     Past Surgical History:  Procedure Laterality Date  . ESOPHAGOGASTRODUODENOSCOPY (EGD) WITH PROPOFOL N/A 12/21/2019   Procedure: ESOPHAGOGASTRODUODENOSCOPY (EGD) WITH PROPOFOL;  Surgeon: Toney Reil, MD;  Location: [facility] ENDOSCOPY;  Service: Gastroenterology;  Laterality: N/A;   Family History: History reviewed. No pertinent family history. Family Psychiatric  History: see H&P  Social History:  Social History   Substance and Sexual Activity  Alcohol Use Yes  . Alcohol/week: 1.0 standard drink  . Types: 1 Standard drinks or equivalent per week     Social History   Substance and  Sexual Activity  Drug Use Not Currently    Social History   Socioeconomic History  . Marital status: Single    Spouse name: Not on file  . Number of children: Not on file  . Years of education: Not on file  . Highest education level: Not on file  Occupational History  . Not on file  Tobacco Use  . Smoking status: Current Every Day Smoker    Packs/day: 0.50    Years: 46.00    Pack years: 23.00    Types: Cigarettes  . Smokeless tobacco: Never Used  Vaping Use  . Vaping Use: Never used  Substance and Sexual Activity  . Alcohol use: Yes    Alcohol/week: 1.0 standard drink    Types: 1 Standard drinks or equivalent per week  . Drug use: Not Currently  . Sexual activity: Not on file  Other Topics Concern  . Not on file  Social History Narrative  . Not on file   Social Determinants of Health   Financial Resource Strain: Not on file  Food Insecurity: Not on file  Transportation Needs: Not on file  Physical Activity: Not on file  Stress: Not on file  Social Connections: Not on file   Additional Social History:    Sleep: Fair  Appetite:  Fair  Current Medications: Current Facility-Administered Medications  Medication Dose Route Frequency Provider Last Rate Last Admin  . acetaminophen (TYLENOL) tablet 650 mg  650 mg Oral Q6H PRN Clapacs, [name], MD   650 mg at 11/30/20 0804  . alum & mag hydroxide-simeth (MAALOX/MYLANTA) 200-200-20 MG/5ML suspension 30 mL  30 mL Oral Q4H PRN Clapacs, [name], MD      .  amLODipine (NORVASC) tablet 5 mg  5 mg Oral Daily Jesse Sans, MD   5 mg at 12/04/20 0800  . atorvastatin (LIPITOR) tablet 20 mg  20 mg Oral Daily Clapacs, [name], MD   20 mg at 12/04/20 0752  . benztropine (COGENTIN) tablet 1 mg  1 mg Oral BID Clapacs, [name], MD   1 mg at 12/04/20 0800  . clonazePAM (KLONOPIN) tablet 1 mg  1 mg Oral BID Clapacs, [name], MD   1 mg at 12/04/20 0800  . divalproex (DEPAKOTE) DR tablet 250 mg  250 mg Oral q morning Thalia Party, MD   250  mg at 12/04/20 0946  . divalproex (DEPAKOTE) DR tablet 500 mg  500 mg Oral QHS Paliy, [name], MD   500 mg at 12/03/20 2211  . haloperidol (HALDOL) tablet 10 mg  10 mg Oral BID Clapacs, [name], MD   10 mg at 12/04/20 0751  . haloperidol (HALDOL) tablet 5 mg  5 mg Oral Q6H PRN Jesse Sans, MD      . hydrOXYzine (ATARAX/VISTARIL) tablet 50 mg  50 mg Oral TID PRN Clapacs, [name], MD   50 mg at 12/03/20 2212  . LORazepam (ATIVAN) tablet 2 mg  2 mg Oral Q6H PRN Clapacs, [name], MD   2 mg at 12/03/20 8119   Or  . LORazepam (ATIVAN) injection 2 mg  2 mg Intramuscular Q6H PRN Clapacs, [name], MD      . losartan (COZAAR) tablet 100 mg  100 mg Oral Daily Clapacs, [name], MD   100 mg at 12/04/20 0751  . magnesium hydroxide (MILK OF MAGNESIA) suspension 30 mL  30 mL Oral Daily PRN Clapacs, [name], MD      . menthol-cetylpyridinium (CEPACOL) lozenge 3 mg  1 lozenge Oral PRN Clapacs, [name], MD      . nicotine (NICODERM CQ - dosed in mg/24 hours) patch 21 mg  21 mg Transdermal Daily Clapacs, [name], MD   21 mg at 12/04/20 0800  . temazepam (RESTORIL) capsule 15 mg  15 mg Oral QHS Clapacs, [name], MD   15 mg at 12/03/20 2215    Lab Results:  No results found for this or any previous visit (from the past 48 hour(s)).  Blood Alcohol level:  Lab Results  Component Value Date   ETH <10 11/25/2020   ETH <10 09/16/2018    Metabolic Disorder Labs: Lab Results  Component Value Date   HGBA1C 5.4 11/27/2020   MPG 108.28 11/27/2020   No results found for: PROLACTIN Lab Results  Component Value Date   CHOL 140 11/27/2020   TRIG 30 11/27/2020   HDL 61 11/27/2020   CHOLHDL 2.3 11/27/2020   VLDL 6 11/27/2020   LDLCALC 73 11/27/2020    Physical Findings: AIMS:  , ,  ,  ,    CIWA:    COWS:     Musculoskeletal: Strength & Muscle Tone: within normal limits Gait & Station: unsteady Patient leans: N/A  Psychiatric Specialty Exam:  Presentation  General Appearance: Disheveled  Eye  Contact:Fleeting  Speech:Garbled; Pressured  Speech Volume:Increased  Handedness:Right   Mood and Affect  Mood: "good" Affect:Inappropriate; Full Range   Thought Process  Thought Processes:Disorganized  Descriptions of Associations:Tangential  Orientation:Partial  Thought Content:Illogical; Paranoid Ideation  History of Schizophrenia/Schizoaffective disorder:Yes  Duration of Psychotic Symptoms:Greater than six months  Hallucinations:Hallucinations: Auditory  Ideas of Reference:Percusatory  Suicidal Thoughts: denies Homicidal Thoughts: denies  Sensorium  Memory:Immediate Poor; Recent Poor; Remote Poor  Judgment:Impaired  Insight:None   Executive Functions  Concentration:Poor  Attention Span:Poor  Recall:Poor  Fund of Knowledge:Poor  Language:Poor   Psychomotor Activity  Psychomotor Activity:Psychomotor Activity: Restlessness; Tremor   Assets  Assets:Resilience; Desire for Improvement   Sleep  Sleep:Sleep: Poor Number of Hours of Sleep: 4    Physical Exam: Physical Exam  ROS  Blood pressure (!) 147/85, pulse 92, temperature 98.8 F (37.1 C), temperature source Oral, resp. rate 18, height 6\' 1"  (1.854 m), weight 68.9 kg, SpO2 98 %. Body mass index is 20.05 kg/m.   Treatment Plan Summary: Daily contact with patient to assess and evaluate symptoms and progress in treatment and Medication management  Patient is a 65 year old male with the above-stated past psychiatric history who is seen in follow-up.  Chart reviewed. Patient discussed with nursing. Patient had episodes of aggression and bizarre behavior yesterday, and was placed on a 1:1. Today patient continues to respond to internal stimuli, but appears somewhat more cooperative. Will continue 1:1 for now as patient appears unsteady on his feet and high fall risk.    Plan:  -continue inpatient psych admission; 15-minute checks; daily contact with patient to assess and evaluate symptoms  and progress in treatment; psychoeducation.   -continue scheduled medications: Continue Haldol 10mg  PO BID for psychosis (increased 2 days ago). Continue Depakote 250mg  PO QAM and 500mg  PO QHS for mood stabilization (increased two days ago).  Valrpoic acid level pending.  Continue Clonazepam 1mg  PO BID for anxiety Continue Temazepam 15mg  PO QHS for insomnia Continue Benztropine 1mg  PO BID for EPS propx.  Continue Amlodipine 5 mg daily for HTN   . amLODipine  5 mg Oral Daily  . atorvastatin  20 mg Oral Daily  . benztropine  1 mg Oral BID  . clonazePAM  1 mg Oral BID  . divalproex  250 mg Oral q morning  . divalproex  500 mg Oral QHS  . haloperidol  10 mg Oral BID  . losartan  100 mg Oral Daily  . nicotine  21 mg Transdermal Daily  . temazepam  15 mg Oral QHS     -continue PRN medications.  acetaminophen, alum & mag hydroxide-simeth, haloperidol, hydrOXYzine, LORazepam **OR** LORazepam, magnesium hydroxide, menthol-cetylpyridinium  -Pertinent Labs: no new labs ordered today    -Consults: No new consults placed since yesterday    -Disposition: Patent is not ready for d/c yet. Possible GH placement. All necessary aftercare will be arranged prior to discharge.  -  I certify that the patient does need, on a daily basis, active treatment furnished directly by or requiring the supervision of inpatient psychiatric facility personnel.   12/04/20: Psychiatric exam above reviewed and remains accurate. Assessment and plan above reviewed and updated.      , MD 12/04/2020, 10:18 AM

## 2020-12-05 MED ORDER — HALOPERIDOL 5 MG PO TABS
15.0000 mg | ORAL_TABLET | Freq: Every evening | ORAL | Status: DC | PRN
  Administered 2020-12-06: 15 mg via ORAL
  Filled 2020-12-05: qty 3

## 2020-12-05 MED ORDER — HALOPERIDOL 5 MG PO TABS
10.0000 mg | ORAL_TABLET | Freq: Every day | ORAL | Status: DC
  Administered 2020-12-06 – 2020-12-12 (×7): 10 mg via ORAL
  Filled 2020-12-05 (×7): qty 2

## 2020-12-05 MED ORDER — DIVALPROEX SODIUM 500 MG PO DR TAB
500.0000 mg | DELAYED_RELEASE_TABLET | Freq: Every morning | ORAL | Status: DC
  Administered 2020-12-06 – 2020-12-12 (×7): 500 mg via ORAL
  Filled 2020-12-05 (×7): qty 1

## 2020-12-05 NOTE — [facility] Group Notes (Signed)
LCSW Group Therapy Note  12/05/2020 2:39 PM  Type of Therapy/Topic:  Group Therapy:  Emotion Regulation  Participation Level:  Did Not Attend   Description of Group:   The purpose of this group is to assist patients in learning to regulate negative emotions and experience positive emotions. Patients will be guided to discuss ways in which they have been vulnerable to their negative emotions. These vulnerabilities will be juxtaposed with experiences of positive emotions or situations, and patients will be challenged to use positive emotions to combat negative ones. Special emphasis will be placed on coping with negative emotions in conflict situations, and patients will process healthy conflict resolution skills.  Therapeutic Goals: 1. Patient will identify two positive emotions or experiences to reflect on in order to balance out negative emotions 2. Patient will label two or more emotions that they find the most difficult to experience 3. Patient will demonstrate positive conflict resolution skills through discussion and/or role plays  Summary of Patient Progress: Patient did not attend group despite encouraged participation.    Therapeutic Modalities:   Cognitive Behavioral Therapy Feelings Identification Dialectical Behavioral Therapy  Gwenevere Ghazi, MSW, Bryon Lions 12/05/2020 2:39 PM

## 2020-12-05 NOTE — Progress Notes (Signed)
Patient agitated this evening. Yelling at other residents getting in their face and being disruptive in the dayroom and hallway. Went into his room and threw his walker across the room. Patient given PRN medication for agitation. Received medication without incident and apologized for behavior. Patient remains on 1:1 due to both safety and behavior. He will continue to be monitored with 1:1 sitter and 15 minute safety rounds.    Henry Butler-Nicholson, LPN

## 2020-12-05 NOTE — Progress Notes (Signed)
Recreation Therapy Notes  Date: 12/05/2020  Time: 9:30 am   Location: Craft room   Behavioral response: Appropriate,Redirection   Intervention Topic: Relaxation   Discussion/Intervention:  Group content today was focused on relaxation. The group defined relaxation and identified healthy ways to relax. Individuals expressed how much time they spend relaxing. Patients expressed how much their life would be if they did not make time for themselves to relax. The group stated ways they could improve their relaxation techniques in the future.  Individuals participated in the intervention "Time to Relax" where they had a chance to experience different relaxation techniques.  Clinical Observations/Feedback: Patient came to group and expressed that he lays down to relax. He held a long conversation by himself, redirection need from Scientist, product/process development to focus and complete task. Individual was social with staff and peers while participating in the intervention. Participant left group early to use the restroom.    Jacob Cicero LRT/CTRS         Binnie Vonderhaar 12/05/2020 12:11 PM

## 2020-12-05 NOTE — Progress Notes (Signed)
The patient remains on a 1:1 observation. He has not been aggressive or intrusive during this shift. The patient is compliant with medications. He did complain of his legs itching, I cleaned his legs with normal saline and greased them with barrier cream. He has not had any AVH or SI/HI. He eventually went to sleep at 1 and has remained asleep for the duration of the shift. The patient was slightly agitated and received PRN meds to help with the agitation and it was effective.

## 2020-12-05 NOTE — Progress Notes (Signed)
I was informed that the patient has put his left hand under the coffee dispenser and let the hot coffee run on his hand. The left hand is red, there is no blistering at this time and the patient is now complaining of pain. I will apply an ice pack to the left hand.

## 2020-12-05 NOTE — Progress Notes (Signed)
Patient stated that he could not sleep good last night because of the voices he was hearing. Patient denies SI,HI and AVH at this time. Patient came out of his room with walker on one hand and bedside commode on other hand.Safety sitter redirected and patient put back the bedside commode.

## 2020-12-05 NOTE — Progress Notes (Signed)
Patient continues to talking to himself. Very impulsive at times to get out of bed to bathroom. Safety maintained with 1:1 sitter.No aggressive behaviors noted. No incontinent episode happened.Support and encouragement given.

## 2020-12-05 NOTE — Progress Notes (Signed)
Sleep: 2.15 hours

## 2020-12-05 NOTE — Progress Notes (Signed)
[facility] MD Progress Note  12/05/2020 2:28 PM Henry Black  MRN:  [id number]  Principal Problem: Schizophrenia, undifferentiated (HCC) Diagnosis: Principal Problem:   Schizophrenia, undifferentiated (HCC) Active Problems:   Chronic hepatitis C (HCC)   Essential hypertension  Total Time spent with patient: 20 minutes  Henry Black is a  65y.o. male patient with Schizophrenia, who [facility] for treatment of psychosis in the context of severe agitation and dangerous behavior.    Interval History Patient was seen today for re-evaluation.  Nursing reports some bizarre behavior overnight such as patient running hot coffee over his hand. The patient has minor issues with performing ADLs.  Patient has been medication compliant. Patient remains on 1:1 since yesterday afternoon due to bizarre behavior and high falls risk.   Subjective:  On assessment patient has garbled speech that is largely nonsensical. He does occasionally turn to face provider, and speak in a clear voice. He notes that he is hungry, and asks me to get him a snack and soda from the closet. He denies any pain from his hand, and allows me to inspect it. No residual redness from coffee on hand overnight. He denies any medication side effects. He denies SI/HI/AH/VH, though continues to respond to internal stimuli. He continues to speak to unseen individuals in the room.  .    Labs: no new results for review.  Past Psychiatric History: see H&P   Past Medical History:  Past Medical History:  Diagnosis Date  . Hepatitis   . Hypertension     Past Surgical History:  Procedure Laterality Date  . ESOPHAGOGASTRODUODENOSCOPY (EGD) WITH PROPOFOL N/A 12/21/2019   Procedure: ESOPHAGOGASTRODUODENOSCOPY (EGD) WITH PROPOFOL;  Surgeon: Toney ReilVanga, Rohini Reddy, MD;  [facility];  Service: Gastroenterology;  Laterality: N/A;   Family History: History reviewed. No pertinent family history. Family Psychiatric  History: see  H&P  Social History:  Social History   Substance and Sexual Activity  Alcohol Use Yes  . Alcohol/week: 1.0 standard drink  . Types: 1 Standard drinks or equivalent per week     Social History   Substance and Sexual Activity  Drug Use Not Currently    Social History   Socioeconomic History  . Marital status: Single    Spouse name: Not on file  . Number of children: Not on file  . Years of education: Not on file  . Highest education level: Not on file  Occupational History  . Not on file  Tobacco Use  . Smoking status: Current Every Day Smoker    Packs/day: 0.50    Years: 46.00    Pack years: 23.00    Types: Cigarettes  . Smokeless tobacco: Never Used  Vaping Use  . Vaping Use: Never used  Substance and Sexual Activity  . Alcohol use: Yes    Alcohol/week: 1.0 standard drink    Types: 1 Standard drinks or equivalent per week  . Drug use: Not Currently  . Sexual activity: Not on file  Other Topics Concern  . Not on file  Social History Narrative  . Not on file   Social Determinants of Health   Financial Resource Strain: Not on file  Food Insecurity: Not on file  Transportation Needs: Not on file  Physical Activity: Not on file  Stress: Not on file  Social Connections: Not on file   Additional Social History:    Sleep: Fair  Appetite:  Fair  Current Medications: Current Facility-Administered Medications  Medication Dose Route Frequency Provider  Last Rate Last Admin  . acetaminophen (TYLENOL) tablet 650 mg  650 mg Oral Q6H PRN Clapacs, [name], MD   650 mg at 11/30/20 0804  . alum & mag hydroxide-simeth (MAALOX/MYLANTA) 200-200-20 MG/5ML suspension 30 mL  30 mL Oral Q4H PRN Clapacs, [name], MD      . amLODipine (NORVASC) tablet 5 mg  5 mg Oral Daily Jesse Sans, MD   5 mg at 12/05/20 0747  . atorvastatin (LIPITOR) tablet 20 mg  20 mg Oral Daily Clapacs, [name], MD   20 mg at 12/05/20 0746  . benztropine (COGENTIN) tablet 1 mg  1 mg Oral BID Clapacs,  [name], MD   1 mg at 12/05/20 0745  . clonazePAM (KLONOPIN) tablet 1 mg  1 mg Oral BID Clapacs, [name], MD   1 mg at 12/05/20 0746  . divalproex (DEPAKOTE) DR tablet 500 mg  500 mg Oral QHS Thalia Party, MD   500 mg at 12/04/20 2122  . [START ON 12/06/2020] divalproex (DEPAKOTE) DR tablet 500 mg  500 mg Oral q morning Jesse Sans, MD      . Melene Muller ON 12/06/2020] haloperidol (HALDOL) tablet 10 mg  10 mg Oral Daily Jesse Sans, MD      . haloperidol (HALDOL) tablet 15 mg  15 mg Oral QHS PRN Jesse Sans, MD      . haloperidol (HALDOL) tablet 5 mg  5 mg Oral Q6H PRN Jesse Sans, MD   5 mg at 12/05/20 7673  . hydrOXYzine (ATARAX/VISTARIL) tablet 50 mg  50 mg Oral TID PRN Clapacs, [name], MD   50 mg at 12/05/20 4193  . LORazepam (ATIVAN) tablet 2 mg  2 mg Oral Q6H PRN Clapacs, [name], MD   2 mg at 12/05/20 7902   Or  . LORazepam (ATIVAN) injection 2 mg  2 mg Intramuscular Q6H PRN Clapacs, [name], MD      . losartan (COZAAR) tablet 100 mg  100 mg Oral Daily Clapacs, [name], MD   100 mg at 12/05/20 0747  . magnesium hydroxide (MILK OF MAGNESIA) suspension 30 mL  30 mL Oral Daily PRN Clapacs, [name], MD      . menthol-cetylpyridinium (CEPACOL) lozenge 3 mg  1 lozenge Oral PRN Clapacs, [name], MD      . nicotine (NICODERM CQ - dosed in mg/24 hours) patch 21 mg  21 mg Transdermal Daily Clapacs, [name], MD   21 mg at 12/05/20 0747  . temazepam (RESTORIL) capsule 15 mg  15 mg Oral QHS Clapacs, [name], MD   15 mg at 12/04/20 2122    Lab Results:  Results for orders placed or performed during the hospital encounter of 11/26/20 (from the past 48 hour(s))  Valproic acid level     Status: Abnormal   Collection Time: 12/04/20  9:55 AM  Result Value Ref Range   Valproic Acid Lvl 40 (L) 50.0 - 100.0 ug/mL    Comment: Performed at [facility], [address] [place], [place] [address]    Blood Alcohol level:  Lab Results  Component Value Date   [facility] <10 11/25/2020   ETH <10  09/16/2018    Metabolic Disorder Labs: Lab Results  Component Value Date   HGBA1C 5.4 11/27/2020   MPG 108.28 11/27/2020   No results found for: PROLACTIN Lab Results  Component Value Date   CHOL 140 11/27/2020   TRIG 30 11/27/2020   HDL 61  11/27/2020   CHOLHDL 2.3 11/27/2020   VLDL 6 11/27/2020   LDLCALC 73 11/27/2020    Physical Findings: AIMS:  , ,  ,  ,    CIWA:    COWS:     Musculoskeletal: Strength & Muscle Tone: within normal limits Gait & Station: unsteady Patient leans: N/A  Psychiatric Specialty Exam:  Presentation  General Appearance: Disheveled  Eye Contact:Fleeting  Speech:Garbled; Pressured  Speech Volume:Increased  Handedness:Right   Mood and Affect  Mood: "good" Affect:Inappropriate; Full Range   Thought Process  Thought Processes:Disorganized  Descriptions of Associations:Tangential  Orientation:Partial  Thought Content:Illogical; Paranoid Ideation  History of Schizophrenia/Schizoaffective disorder:Yes  Duration of Psychotic Symptoms:Greater than six months  Hallucinations:No data recorded  Ideas of Reference:Percusatory  Suicidal Thoughts: denies Homicidal Thoughts: denies  Sensorium  Memory:Immediate Poor; Recent Poor; Remote Poor  Judgment:Impaired  Insight:None   Executive Functions  Concentration:Poor  Attention Span:Poor  Recall:Poor  Fund of Knowledge:Poor  Language:Poor   Psychomotor Activity  Psychomotor Activity: Normal  Assets  Assets:Resilience; Desire for Improvement   Sleep  Sleep: Poor, 2.15 hours   Physical Exam: Physical Exam  ROS  Blood pressure (!) 139/100, pulse 92, temperature 98.8 F (37.1 C), temperature source Oral, resp. rate 18, height 6\' 1"  (1.854 m), weight 68.9 kg, SpO2 99 %. Body mass index is 20.05 kg/m.   Treatment Plan Summary: Daily contact with patient to assess and evaluate symptoms and progress in treatment and Medication management  Patient is a  65 year old male with the above-stated past psychiatric history who is seen in follow-up.  Chart reviewed. Patient discussed with nursing. Patient had episodes of aggression and bizarre behavior yesterday, and was placed on a 1:1. Today patient continues to respond to internal stimuli, but appears somewhat more cooperative. Will continue 1:1 for now as patient appears unsteady on his feet and high fall risk.    Plan:  -continue inpatient psych admission; 15-minute checks; daily contact with patient to assess and evaluate symptoms and progress in treatment; psychoeducation.   -continue scheduled medications: Increase Haldol 10mg  in the morning, 15 mg in the evening Haldol Decanoate 150 mg IM injection given 11/15/20 prior to admission Increase Depakote 500 mg BID, valproic acid level 40 on 5/10  Continue Clonazepam 1mg  PO BID for anxiety Continue Temazepam 15mg  PO QHS for insomnia Continue Benztropine 1mg  PO BID for EPS propx.  Continue Amlodipine 5 mg daily for HTN   . amLODipine  5 mg Oral Daily  . atorvastatin  20 mg Oral Daily  . benztropine  1 mg Oral BID  . clonazePAM  1 mg Oral BID  . divalproex  500 mg Oral QHS  . [START ON 12/06/2020] divalproex  500 mg Oral q morning  . [START ON 12/06/2020] haloperidol  10 mg Oral Daily  . losartan  100 mg Oral Daily  . nicotine  21 mg Transdermal Daily  . temazepam  15 mg Oral QHS     -continue PRN medications.  acetaminophen, alum & mag hydroxide-simeth, haloperidol, haloperidol, hydrOXYzine, LORazepam **OR** LORazepam, magnesium hydroxide, menthol-cetylpyridinium  -Pertinent Labs: valproic acid level 40 on 5/10    -Consults: No new consults placed since yesterday    -Disposition: Patent is not ready for d/c yet. Possible GH placement. All necessary aftercare will be arranged prior to discharge.  -  I certify that the patient does need, on a daily basis, active treatment furnished directly by or requiring the supervision of inpatient  psychiatric facility personnel.   12/05/20: Psychiatric exam  above reviewed and remains accurate. Assessment and plan above reviewed and updated.       Jesse Sans, MD 12/05/2020, 2:28 PM

## 2020-12-06 MED ORDER — HALOPERIDOL 5 MG PO TABS
15.0000 mg | ORAL_TABLET | Freq: Every day | ORAL | Status: DC
  Administered 2020-12-06: 15 mg via ORAL
  Filled 2020-12-06: qty 3

## 2020-12-06 NOTE — Plan of Care (Addendum)
Patient during assessments this morning endorsed a normal mood and affect was flat and constricted. Pt. Denied si/hi/avh and endorsed ability to continue to remain safe on the unit. Pt. Orientation presented partially intact, only really oriented to self and some parts of place, time, and situation (though this is variable). Pt. Denied physical pain and endorsed toleration of medications thus far. Pt. Endorsed no problems with sleeping and or eating at this time. Pt. Had no complaints for this Clinical research associate. Pt. Thought process mainly appreciably disorganized and illogical, though some linear but concrete thought process observed intermittently. Pt. Speech mostly garbled and difficult to follow, but with having the patient repeat words, understanding is able to be obtained.   Patient has been complaint with medications and unit procedures thus far with direction and encouragement. Pt. Has been observed eating good thus far, observed up in the day room eating breakfast. Pt. Has been able to remain safe on the unit thus far. Pt. Thus far mostly observed pacing around the unit with his walker talking to himself.   Q x 15 minute observation checks in place/maintained for safety. Patient is provided with education throughout shift when appropriate and able.  Patient is given/offered medications per orders. Patient is encouraged to attend groups, participate in unit activities and continue with plan of care. Pt. Chart and plans of care reviewed. Pt. Given support and encouragement when appropriate and able.     Problem: Education: Goal: Will be free of psychotic symptoms Outcome: Not Progressing Note: Pt. Continues to present with illogical and disorganized thought process.    Problem: Health Behavior/Discharge Planning: Goal: Compliance with prescribed medication regimen will improve Outcome: Progressing Note: Pt. Has been complaint with medications.    Problem: Safety: Goal: Ability to remain free from injury  will improve Outcome: Progressing Note: Pt. Has been able to remain safe on the unit.

## 2020-12-06 NOTE — Progress Notes (Signed)
[facility] MD Progress Note  12/06/2020 10:15 AM Henry Black  MRN:  [id number]  Principal Problem: Schizophrenia, undifferentiated (HCC) Diagnosis: Principal Problem:   Schizophrenia, undifferentiated (HCC) Active Problems:   Chronic hepatitis C (HCC)   Essential hypertension  Total Time spent with patient: 20 minutes  CC: "They making fun of me."  Henry Black is a  65y.o. male patient with Schizophrenia, who presents to the [facility] unit for treatment of psychosis in the context of severe agitation and dangerous behavior.    Interval History Patient was seen today for re-evaluation. Overnight patient was agitated, yelling at other residents, and threw his walker across his bedroom. He was given PRN medication for agitation. He later apologized for this behavior. The patient has minor issues with performing ADLs.  Patient has been medication compliant. Patient remains on 1:1 for aggression and falls risk.   Subjective:  On assessment this morning patient initially with rapid, garbled speech in quite tone that could not be understood. However, when asked to repeat louder he was able to speak in a coherent voice. He noted that he felt people were making fun of him. He is able to contract for safety. Without prompting, he regurgitates the rules that nursing staff has been reinforcing: "Gonna use my walker, go to the bathroom in the bathroom, not yell, eat my food." He does express desire to leave hospital and return to his group home, and is aware that not acting out is an important step in the process. He still appears psychotic, and has still been observed speaking to himself which patient does not do at baseline.   .  Labs: no new results for review.  Past Psychiatric History: see H&P   Past Medical History:  Past Medical History:  Diagnosis Date  . Hepatitis   . Hypertension     Past Surgical History:  Procedure Laterality Date  . ESOPHAGOGASTRODUODENOSCOPY (EGD) WITH PROPOFOL N/A  12/21/2019   Procedure: ESOPHAGOGASTRODUODENOSCOPY (EGD) WITH PROPOFOL;  Surgeon: Toney Reil, MD;  Location: [facility] ENDOSCOPY;  Service: Gastroenterology;  Laterality: N/A;   Family History: History reviewed. No pertinent family history. Family Psychiatric  History: see H&P  Social History:  Social History   Substance and Sexual Activity  Alcohol Use Yes  . Alcohol/week: 1.0 standard drink  . Types: 1 Standard drinks or equivalent per week     Social History   Substance and Sexual Activity  Drug Use Not Currently    Social History   Socioeconomic History  . Marital status: Single    Spouse name: Not on file  . Number of children: Not on file  . Years of education: Not on file  . Highest education level: Not on file  Occupational History  . Not on file  Tobacco Use  . Smoking status: Current Every Day Smoker    Packs/day: 0.50    Years: 46.00    Pack years: 23.00    Types: Cigarettes  . Smokeless tobacco: Never Used  Vaping Use  . Vaping Use: Never used  Substance and Sexual Activity  . Alcohol use: Yes    Alcohol/week: 1.0 standard drink    Types: 1 Standard drinks or equivalent per week  . Drug use: Not Currently  . Sexual activity: Not on file  Other Topics Concern  . Not on file  Social History Narrative  . Not on file   Social Determinants of Health   Financial Resource Strain: Not on file  Food Insecurity: Not on file  Transportation Needs: Not on file  Physical Activity: Not on file  Stress: Not on file  Social Connections: Not on file   Additional Social History:    Sleep: Poor  Appetite:  Fair  Current Medications: Current Facility-Administered Medications  Medication Dose Route Frequency Provider Last Rate Last Admin  . acetaminophen (TYLENOL) tablet 650 mg  650 mg Oral Q6H PRN Clapacs, [name], MD   650 mg at 11/30/20 0804  . alum & mag hydroxide-simeth (MAALOX/MYLANTA) 200-200-20 MG/5ML suspension 30 mL  30 mL Oral Q4H PRN Clapacs,  [name], MD      . amLODipine (NORVASC) tablet 5 mg  5 mg Oral Daily Jesse Sans, MD   5 mg at 12/06/20 0717  . atorvastatin (LIPITOR) tablet 20 mg  20 mg Oral Daily Clapacs, [name], MD   20 mg at 12/06/20 0717  . benztropine (COGENTIN) tablet 1 mg  1 mg Oral BID Clapacs, [name], MD   1 mg at 12/06/20 0717  . clonazePAM (KLONOPIN) tablet 1 mg  1 mg Oral BID Clapacs, [name], MD   1 mg at 12/06/20 0717  . divalproex (DEPAKOTE) DR tablet 500 mg  500 mg Oral QHS Thalia Party, MD   500 mg at 12/05/20 2136  . divalproex (DEPAKOTE) DR tablet 500 mg  500 mg Oral q morning Jesse Sans, MD   500 mg at 12/06/20 0910  . haloperidol (HALDOL) tablet 10 mg  10 mg Oral Daily Jesse Sans, MD   10 mg at 12/06/20 0717  . haloperidol (HALDOL) tablet 15 mg  15 mg Oral QHS PRN Jesse Sans, MD   15 mg at 12/06/20 0156  . haloperidol (HALDOL) tablet 5 mg  5 mg Oral Q6H PRN Jesse Sans, MD   5 mg at 12/05/20 2036  . hydrOXYzine (ATARAX/VISTARIL) tablet 50 mg  50 mg Oral TID PRN Audery Amel, MD   50 mg at 12/05/20 2137  . LORazepam (ATIVAN) tablet 2 mg  2 mg Oral Q6H PRN Clapacs, [name], MD   2 mg at 12/05/20 2036   Or  . LORazepam (ATIVAN) injection 2 mg  2 mg Intramuscular Q6H PRN Clapacs, [name], MD      . losartan (COZAAR) tablet 100 mg  100 mg Oral Daily Clapacs, [name], MD   100 mg at 12/06/20 0718  . magnesium hydroxide (MILK OF MAGNESIA) suspension 30 mL  30 mL Oral Daily PRN Clapacs, [name], MD      . menthol-cetylpyridinium (CEPACOL) lozenge 3 mg  1 lozenge Oral PRN Clapacs, [name], MD      . nicotine (NICODERM CQ - dosed in mg/24 hours) patch 21 mg  21 mg Transdermal Daily Clapacs, [name], MD   21 mg at 12/06/20 0717  . temazepam (RESTORIL) capsule 15 mg  15 mg Oral QHS Clapacs, [name], MD   15 mg at 12/05/20 2137    Lab Results:  No results found for this or any previous visit (from the past 48 hour(s)).  Blood Alcohol level:  Lab Results  Component Value Date   ETH <10  11/25/2020   ETH <10 09/16/2018    Metabolic Disorder Labs: Lab Results  Component Value Date   HGBA1C 5.4 11/27/2020   MPG 108.28 11/27/2020   No results found for: PROLACTIN Lab Results  Component Value Date   CHOL 140 11/27/2020   TRIG 30 11/27/2020   HDL 61 11/27/2020   CHOLHDL  2.3 11/27/2020   VLDL 6 11/27/2020   LDLCALC 73 11/27/2020    Physical Findings: AIMS:  , ,  ,  ,    CIWA:    COWS:     Musculoskeletal: Strength & Muscle Tone: within normal limits Gait & Station: unsteady Patient leans: N/A  Psychiatric Specialty Exam:  Presentation  General Appearance: Disheveled  Eye Contact:Fleeting  Speech:Garbled; Pressured  Speech Volume:Increased  Handedness:Right   Mood and Affect  Mood: "good" Affect:Inappropriate; Full Range   Thought Process  Thought Processes:Disorganized  Descriptions of Associations:Tangential  Orientation:Partial  Thought Content:Illogical; Paranoid Ideation  History of Schizophrenia/Schizoaffective disorder:Yes  Duration of Psychotic Symptoms:Greater than six months  Hallucinations: Patient denies though seen responding to internal stimuli Ideas of Reference:Percusatory  Suicidal Thoughts: denies Homicidal Thoughts: denies  Sensorium  Memory:Immediate Poor; Recent Poor; Remote Poor  Judgment:Impaired  Insight:None   Executive Functions  Concentration:Poor  Attention Span:Poor  Recall:Poor  Fund of Knowledge:Poor  Language:Poor   Psychomotor Activity  Psychomotor Activity: Normal  Assets  Assets:Resilience; Desire for Improvement   Sleep  Sleep: Poor, 2.5 hours   Physical Exam: Physical Exam  ROS  Blood pressure (!) 147/81, pulse 91, temperature 98.1 F (36.7 C), temperature source Oral, resp. rate 18, height 6\' 1"  (1.854 m), weight 68.9 kg, SpO2 100 %. Body mass index is 20.05 kg/m.   Treatment Plan Summary: Daily contact with patient to assess and evaluate symptoms and progress  in treatment and Medication management  Patient is a 65 year old male with the above-stated past psychiatric history who is seen in follow-up.  Chart reviewed. Patient discussed with nursing. Patient had episodes of aggression overnight requiring PRNs.    Plan:  -continue inpatient psych admission; 15-minute checks; daily contact with patient to assess and evaluate symptoms and progress in treatment; psychoeducation.   -continue scheduled medications: Continue Haldol 10mg  in the morning, 15 mg in the evening (increased yesterday) Haldol Decanoate 150 mg IM injection given 11/15/20 prior to admission Continue Depakote 500 mg BID (increased yesterday), valproic acid level 40 on 5/10  Continue Clonazepam 1mg  PO BID for anxiety Continue Temazepam 15mg  PO QHS for insomnia Continue Benztropine 1mg  PO BID for EPS propx.  Continue Amlodipine 5 mg daily for HTN   . amLODipine  5 mg Oral Daily  . atorvastatin  20 mg Oral Daily  . benztropine  1 mg Oral BID  . clonazePAM  1 mg Oral BID  . divalproex  500 mg Oral QHS  . divalproex  500 mg Oral q morning  . haloperidol  10 mg Oral Daily  . losartan  100 mg Oral Daily  . nicotine  21 mg Transdermal Daily  . temazepam  15 mg Oral QHS     -continue PRN medications.  acetaminophen, alum & mag hydroxide-simeth, haloperidol, haloperidol, hydrOXYzine, LORazepam **OR** LORazepam, magnesium hydroxide, menthol-cetylpyridinium  -Pertinent Labs: valproic acid level 40 on 5/10    -Consults: No new consults placed since yesterday    -Disposition: Patent is not ready for d/c yet. Possible GH placement. All necessary aftercare will be arranged prior to discharge.  -  I certify that the patient does need, on a daily basis, active treatment furnished directly by or requiring the supervision of inpatient psychiatric facility personnel.   12/06/20: Psychiatric exam above reviewed and remains accurate. Assessment and plan above reviewed and updated.     , MD 12/06/2020, 10:15 AM

## 2020-12-06 NOTE — Progress Notes (Signed)
Recreation Therapy Notes    Date: 12/06/2020   Time:9:30am   Location: Craft room   Behavioral response: N/A   Intervention Topic: Strengths     Discussion/Intervention: Patient did not attend group.   Clinical Observations/Feedback:  Patient did not attend group.   Val Schiavo LRT/CTRS          Alois Colgan 12/06/2020 10:07 AM

## 2020-12-06 NOTE — Clinical Note (Incomplete)
detox, elimination of symptoms of psychosis, medication management for mood stabilization; elimination of SI thoughts; development of comprehensive mental wellness/sobriety plan.

## 2020-12-06 NOTE — [facility] Group Notes (Signed)
LCSW Group Therapy Note  12/06/2020 2:42 PM  Type of Therapy/Topic:  Group Therapy:  Balance in Life  Participation Level:  Active  Description of Group:    This group will address the concept of balance and how it feels and looks when one is unbalanced. Patients will be encouraged to process areas in their lives that are out of balance and identify reasons for remaining unbalanced. Facilitators will guide patients in utilizing problem-solving interventions to address and correct the stressor making their life unbalanced. Understanding and applying boundaries will be explored and addressed for obtaining and maintaining a balanced life. Patients will be encouraged to explore ways to assertively make their unbalanced needs known to significant others in their lives, using other group members and facilitator for support and feedback.  Therapeutic Goals: 1. Patient will identify two or more emotions or situations they have that consume much of in their lives. 2. Patient will identify signs/triggers that life has become out of balance:  3. Patient will identify two ways to set boundaries in order to achieve balance in their lives:  4. Patient will demonstrate ability to communicate their needs through discussion and/or role plays  Summary of Patient Progress: Pt was present for the majority of the group. He was able to respond appropriately to the question of what balance means to him, however, after this pt was not on topic and could not get back on topic. Pt was easily redirected and appropriate during group although he did want to monopolize the discussion, stating that another peer was speaking too much.  Therapeutic Modalities:   Cognitive Behavioral Therapy Solution-Focused Therapy Assertiveness Training  [employer]. Algis Greenhouse, MSW, LCSW, LCAS 12/06/2020 2:42 PM

## 2020-12-07 LAB — COMPREHENSIVE METABOLIC PANEL
ALT: 24 U/L (ref 0–44)
AST: 21 U/L (ref 15–41)
Albumin: 3.7 g/dL (ref 3.5–5.0)
Alkaline Phosphatase: 69 U/L (ref 38–126)
Anion gap: 7 (ref 5–15)
BUN: 15 mg/dL (ref 8–23)
CO2: 27 mmol/L (ref 22–32)
Calcium: 9 mg/dL (ref 8.9–10.3)
Chloride: 97 mmol/L — ABNORMAL LOW (ref 98–111)
Creatinine, Ser: 0.89 mg/dL (ref 0.61–1.24)
GFR, Estimated: >60 mL/min (ref >60)
Glucose, Bld: 94 mg/dL (ref 70–99)
Potassium: 4.9 mmol/L (ref 3.5–5.1)
Sodium: 131 mmol/L — ABNORMAL LOW (ref 135–145)
Total Bilirubin: 0.5 mg/dL (ref 0.3–1.2)
Total Protein: 6.2 g/dL — ABNORMAL LOW (ref 6.5–8.1)

## 2020-12-07 LAB — VALPROIC ACID LEVEL: Valproic Acid Lvl: 59 ug/mL (ref 50.0–100.0)

## 2020-12-07 LAB — GLUCOSE, CAPILLARY: Glucose-Capillary: 119 mg/dL — ABNORMAL HIGH (ref 70–99)

## 2020-12-07 LAB — AMMONIA: Ammonia: 25 umol/L (ref 9–35)

## 2020-12-07 MED ORDER — HALOPERIDOL 5 MG PO TABS
20.0000 mg | ORAL_TABLET | Freq: Every day | ORAL | Status: DC
  Administered 2020-12-07 – 2020-12-11 (×5): 20 mg via ORAL
  Filled 2020-12-07 (×5): qty 4

## 2020-12-07 NOTE — [facility] Counselor (Signed)
LCSW Group Therapy Note  12/07/2020 1:54 PM  Type of Therapy and Topic:  Group Therapy:  Feelings around Relapse and Recovery  Participation Level:  None   Description of Group:    Patients in this group will discuss emotions they experience before and after a relapse. They will process how experiencing these feelings, or avoidance of experiencing them, relates to having a relapse. Facilitator will guide patients to explore emotions they have related to recovery. Patients will be encouraged to process which emotions are more powerful. They will be guided to discuss the emotional reaction significant others in their lives may have to their relapse or recovery. Patients will be assisted in exploring ways to respond to the emotions of others without this contributing to a relapse.  Therapeutic Goals: 1. Patient will identify two or more emotions that lead to a relapse for them 2. Patient will identify two emotions that result when they relapse 3. Patient will identify two emotions related to recovery 4. Patient will demonstrate ability to communicate their needs through discussion and/or role plays   Summary of Patient Progress: Patient was present initially for group, shortly after, he was observed leaning in seat and difficult to illicit a response. Nursing and physician was called and escorted patient to his room to rest.   Therapeutic Modalities:   Cognitive Behavioral Therapy Solution-Focused Therapy Assertiveness Training Relapse Prevention Therapy   Gwenevere Ghazi, MSW, Bryon Lions 12/07/2020 1:54 PM

## 2020-12-07 NOTE — Plan of Care (Signed)
  Problem: Education: Goal: Will be free of psychotic symptoms Outcome: Progressing   Problem: Health Behavior/Discharge Planning: Goal: Compliance with prescribed medication regimen will improve Outcome: Progressing   Problem: Safety: Goal: Ability to remain free from injury will improve Outcome: Progressing   

## 2020-12-07 NOTE — Progress Notes (Signed)
Recreation Therapy Notes   Date: 12/07/2020  Time: 9:30 am   Location: Craft room  Behavioral response: N/A   Intervention Topic: Happiness    Discussion/Intervention: Patient did not attend group.   Clinical Observations/Feedback:  Patient did not attend group.   Kalina Morabito LRT/CTRS        Jessilyn Catino 12/07/2020 11:28 AM

## 2020-12-07 NOTE — Progress Notes (Signed)
[facility] MD Progress Note  12/07/2020 9:58 AM Henry Black  MRN:  [id number]  Principal Problem: Schizophrenia, undifferentiated (HCC) Diagnosis: Principal Problem:   Schizophrenia, undifferentiated (HCC) Active Problems:   Chronic hepatitis C (HCC)   Essential hypertension  Total Time spent with patient: 20 minutes  CC: "I'm boxing."  Mr. Blouch is a  65y.o. male patient with Schizophrenia, who presents to the [facility] unit for treatment of psychosis in the context of severe agitation and dangerous behavior.    Interval History Patient was seen today for re-evaluation. Overnight patient was bizarre, making gestures, posturing, and moving chairs in and out of his room. However, no episodes of aggression. The patient has minor issues with performing ADLs.  Patient has been medication compliant. Sitter has been discontinued.   Subjective:  On assessment this morning patient is standing in the door way punching the air. He stops on approach, and informs me that he is boxing to get his exercise in. His speech is dysarthric, but much easier to understand today. He is pleasant, and makes occasional jokes. For example, he points to another individual wearing superman themed slippers and states that this individual is superman patrolling the hallways. He denies any SI/HI/AH/VH today. He has remained safe without sitter present. He still appears to be speaking to himself when no one is nearby. Will continue to monitor.    Contacted Drucilla Schmidt at [phone] to provide update. She states that patient does not usually show confusion or aggression at night time/does not evidence sundowning at baseline. She again confirms that patient does not respond to internal stimuli at baseline.   .  Labs: no new results for review.  Past Psychiatric History: see H&P   Past Medical History:  Past Medical History:  Diagnosis Date  . Hepatitis   . Hypertension     Past Surgical History:  Procedure Laterality  Date  . ESOPHAGOGASTRODUODENOSCOPY (EGD) WITH PROPOFOL N/A 12/21/2019   Procedure: ESOPHAGOGASTRODUODENOSCOPY (EGD) WITH PROPOFOL;  Surgeon: Toney Reil, MD;  Location: [facility] ENDOSCOPY;  Service: Gastroenterology;  Laterality: N/A;   Family History: History reviewed. No pertinent family history. Family Psychiatric  History: see H&P  Social History:  Social History   Substance and Sexual Activity  Alcohol Use Yes  . Alcohol/week: 1.0 standard drink  . Types: 1 Standard drinks or equivalent per week     Social History   Substance and Sexual Activity  Drug Use Not Currently    Social History   Socioeconomic History  . Marital status: Single    Spouse name: Not on file  . Number of children: Not on file  . Years of education: Not on file  . Highest education level: Not on file  Occupational History  . Not on file  Tobacco Use  . Smoking status: Current Every Day Smoker    Packs/day: 0.50    Years: 46.00    Pack years: 23.00    Types: Cigarettes  . Smokeless tobacco: Never Used  Vaping Use  . Vaping Use: Never used  Substance and Sexual Activity  . Alcohol use: Yes    Alcohol/week: 1.0 standard drink    Types: 1 Standard drinks or equivalent per week  . Drug use: Not Currently  . Sexual activity: Not on file  Other Topics Concern  . Not on file  Social History Narrative  . Not on file   Social Determinants of Health   Financial Resource Strain: Not on file  Food Insecurity: Not on file  Transportation Needs: Not on file  Physical Activity: Not on file  Stress: Not on file  Social Connections: Not on file   Additional Social History:    Sleep: Poor  Appetite:  Fair  Current Medications: Current Facility-Administered Medications  Medication Dose Route Frequency Provider Last Rate Last Admin  . acetaminophen (TYLENOL) tablet 650 mg  650 mg Oral Q6H PRN Clapacs, [name], MD   650 mg at 11/30/20 0804  . alum & mag hydroxide-simeth (MAALOX/MYLANTA)  200-200-20 MG/5ML suspension 30 mL  30 mL Oral Q4H PRN Clapacs, [name], MD      . amLODipine (NORVASC) tablet 5 mg  5 mg Oral Daily Jesse Sans, MD   5 mg at 12/07/20 0740  . atorvastatin (LIPITOR) tablet 20 mg  20 mg Oral Daily Clapacs, [name], MD   20 mg at 12/07/20 0740  . benztropine (COGENTIN) tablet 1 mg  1 mg Oral BID Clapacs, [name], MD   1 mg at 12/07/20 0740  . clonazePAM (KLONOPIN) tablet 1 mg  1 mg Oral BID Clapacs, [name], MD   1 mg at 12/07/20 0740  . divalproex (DEPAKOTE) DR tablet 500 mg  500 mg Oral QHS Thalia Party, MD   500 mg at 12/06/20 2123  . divalproex (DEPAKOTE) DR tablet 500 mg  500 mg Oral q morning Jesse Sans, MD   500 mg at 12/06/20 0910  . haloperidol (HALDOL) tablet 10 mg  10 mg Oral Daily Jesse Sans, MD   10 mg at 12/07/20 0740  . haloperidol (HALDOL) tablet 20 mg  20 mg Oral QHS Jesse Sans, MD      . haloperidol (HALDOL) tablet 5 mg  5 mg Oral Q6H PRN Jesse Sans, MD   5 mg at 12/05/20 2036  . hydrOXYzine (ATARAX/VISTARIL) tablet 50 mg  50 mg Oral TID PRN Audery Amel, MD   50 mg at 12/05/20 2137  . LORazepam (ATIVAN) tablet 2 mg  2 mg Oral Q6H PRN Clapacs, [name], MD   2 mg at 12/05/20 2036   Or  . LORazepam (ATIVAN) injection 2 mg  2 mg Intramuscular Q6H PRN Clapacs, [name], MD      . losartan (COZAAR) tablet 100 mg  100 mg Oral Daily Clapacs, [name], MD   100 mg at 12/07/20 0741  . magnesium hydroxide (MILK OF MAGNESIA) suspension 30 mL  30 mL Oral Daily PRN Clapacs, [name], MD      . menthol-cetylpyridinium (CEPACOL) lozenge 3 mg  1 lozenge Oral PRN Clapacs, [name], MD      . nicotine (NICODERM CQ - dosed in mg/24 hours) patch 21 mg  21 mg Transdermal Daily Clapacs, [name], MD   21 mg at 12/07/20 0741  . temazepam (RESTORIL) capsule 15 mg  15 mg Oral QHS Clapacs, [name], MD   15 mg at 12/06/20 2123    Lab Results:  No results found for this or any previous visit (from the past 48 hour(s)).  Blood Alcohol level:  Lab Results   Component Value Date   ETH <10 11/25/2020   ETH <10 09/16/2018    Metabolic Disorder Labs: Lab Results  Component Value Date   HGBA1C 5.4 11/27/2020   MPG 108.28 11/27/2020   No results found for: PROLACTIN Lab Results  Component Value Date   CHOL 140 11/27/2020   TRIG 30 11/27/2020   HDL 61 11/27/2020   CHOLHDL 2.3 11/27/2020  VLDL 6 11/27/2020   LDLCALC 73 11/27/2020    Physical Findings: AIMS:  , ,  ,  ,    CIWA:    COWS:     Musculoskeletal: Strength & Muscle Tone: within normal limits Gait & Station: unsteady Patient leans: N/A  Psychiatric Specialty Exam:  Presentation  General Appearance: Disheveled  Eye Contact:Fleeting  Speech:Garbled; Pressured  Speech Volume:Increased  Handedness:Right   Mood and Affect  Mood: "good" Affect:Inappropriate; Full Range   Thought Process  Thought Processes:Disorganized  Descriptions of Associations:Tangential  Orientation:Partial  Thought Content:Illogical; Paranoid Ideation  History of Schizophrenia/Schizoaffective disorder:Yes  Duration of Psychotic Symptoms:Greater than six months  Hallucinations: Patient denies though seen responding to internal stimuli Ideas of Reference:Percusatory  Suicidal Thoughts: denies Homicidal Thoughts: denies  Sensorium  Memory:Immediate Poor; Recent Poor; Remote Poor  Judgment:Impaired  Insight:None   Executive Functions  Concentration:Poor  Attention Span:Poor  Recall:Poor  Fund of Knowledge:Poor  Language:Poor   Psychomotor Activity  Psychomotor Activity: Normal  Assets  Assets:Resilience; Desire for Improvement   Sleep  Sleep: Poor, 2.5 hours   Physical Exam: Physical Exam  ROS  Blood pressure 135/87, pulse 86, temperature 98.1 F (36.7 C), temperature source Oral, resp. rate 18, height 6\' 1"  (1.854 m), weight 68.9 kg, SpO2 100 %. Body mass index is 20.05 kg/m.   Treatment Plan Summary: Daily contact with patient to assess and  evaluate symptoms and progress in treatment and Medication management  Patient is a 65 year old male with the above-stated past psychiatric history who is seen in follow-up.  Chart reviewed. Patient discussed with nursing. Patient had episodes of aggression overnight requiring PRNs.    Plan:  -continue inpatient psych admission; 15-minute checks; daily contact with patient to assess and evaluate symptoms and progress in treatment; psychoeducation.   -continue scheduled medications: Continue Haldol 10mg  in the morning, Increase Haldol 20 mg in the evening  Haldol Decanoate 150 mg IM injection given 11/15/20 prior to admission Continue Depakote 500 mg BID (increased 5/11), valproic acid level 40 on 5/10  Continue Clonazepam 1mg  PO BID for anxiety Continue Temazepam 15mg  PO QHS for insomnia Continue Benztropine 1mg  PO BID for EPS propx.  Continue Amlodipine 5 mg daily for HTN   . amLODipine  5 mg Oral Daily  . atorvastatin  20 mg Oral Daily  . benztropine  1 mg Oral BID  . clonazePAM  1 mg Oral BID  . divalproex  500 mg Oral QHS  . divalproex  500 mg Oral q morning  . haloperidol  10 mg Oral Daily  . haloperidol  20 mg Oral QHS  . losartan  100 mg Oral Daily  . nicotine  21 mg Transdermal Daily  . temazepam  15 mg Oral QHS     -continue PRN medications.  acetaminophen, alum & mag hydroxide-simeth, haloperidol, hydrOXYzine, LORazepam **OR** LORazepam, magnesium hydroxide, menthol-cetylpyridinium  -Pertinent Labs: valproic acid level 40 on 5/10    -Consults: No new consults placed since yesterday    -Disposition: Patent is not ready for d/c yet. Patient allowed to returned to group home once stabilized. All necessary aftercare will be arranged prior to discharge.  -  I certify that the patient does need, on a daily basis, active treatment furnished directly by or requiring the supervision of inpatient psychiatric facility personnel.   12/07/20: Psychiatric exam above reviewed and  remains accurate. Assessment and plan above reviewed and updated.     , MD 12/07/2020, 9:58 AM

## 2020-12-07 NOTE — Progress Notes (Signed)
Patient tends to be more lucid in the early part of the shift and later in the night gets more bizarre, making gestures and posturing. He has been moving chairs in and out of his room. Earlier in the shift he was carrying a urinal filled with water.Not yelling or crying out.

## 2020-12-07 NOTE — Progress Notes (Signed)
Alerted by CSW that patient was feeling dizzy in group. On assessment patient leaning back, and somewhat confused. Nursing staff assisted with transport back to room. On physical exam CN 2-12 grossly intact. Strength 5/5 BUE and BLE. Sensation intact to light touch bilaterally. He is oriented to name, and place which is his baseline. No increased dysarthria. Vital signs obtained and within normal limits. FSBS 118. Patient resting in bed now. Will also order CMP, valproic acid level, and ammonia level.

## 2020-12-07 NOTE — Progress Notes (Signed)
Patient slept for an hour after he was dizzy in group. Patient got up and walking around with no walker. Patient talking to himself and responding with gestures and actions. Encouraged fluids. Monitoring closely.

## 2020-12-08 NOTE — Progress Notes (Signed)
Patient alert and oriented x 3, affect is blunted, thoughts are disorganized, he denies SI/HI/AVH he was noted innervating with peers and staff, no distress noted, 15 minutes safety checks maintained will continue to monitor.

## 2020-12-08 NOTE — Progress Notes (Addendum)
D: Pt alert and oriented. Pt denies experiencing any anxiety/depression at this time. Pt denies experiencing any pain at this time. Pt denies experiencing any SI/HI, or AVH at this time.   Pt appeared sullen this morning and did not want to speak with anyone. By the afternoon pt was conversating with other. Pt also appeared to be responding to internal stimuli at times  Pt was also observed outside playing basketball and dancing to music and was steady on his feet.  A: Scheduled medications administered to pt, per MD orders. Support and encouragement provided. Frequent verbal contact made. Routine safety checks conducted q15 minutes.   R: No adverse drug reactions noted. Pt verbally contracts for safety at this time. Pt complaint with medications. Pt interacts well with others on the unit. Pt remains safe at this time. Will continue to monitor.

## 2020-12-08 NOTE — Progress Notes (Signed)
[facility] MD Progress Note  12/08/2020 11:11 AM Henry Black  MRN:  [id number]  Principal Problem: Schizophrenia, undifferentiated (HCC) Diagnosis: Principal Problem:   Schizophrenia, undifferentiated (HCC) Active Problems:   Chronic hepatitis C (HCC)   Essential hypertension  Total Time spent with patient: 20 minutes  CC: "I'm okay"  Henry Black is a  65y.o. male patient with Schizophrenia, who presents to the [facility] unit for treatment of psychosis in the context of severe agitation and dangerous behavior.    Interval History Patient was seen today for re-evaluation. No acute events overnight, ADLs improved.  Patient has been medication compliant.  Subjective:  Yesterday afternoon patient with episode of dizziness. Vital signs and FSBS within normal limits. CMP, ammonia, and valproic acid levels also within normal limits. Episode resolved spontaneously and has not reoccurred. This morning patient ambulating without difficulty, and coherent. He notes that he is feeling okay today. He denies SI/HI/AH/VH. Will continue to monitor.   Labs: Ammonia 25, CMP grossly normal aside from mildly low sodium 131, choloride 4.9, and total protein 6.2. Valproic acid level 59  Past Psychiatric History: see H&P   Past Medical History:  Past Medical History:  Diagnosis Date  . Hepatitis   . Hypertension     Past Surgical History:  Procedure Laterality Date  . ESOPHAGOGASTRODUODENOSCOPY (EGD) WITH PROPOFOL N/A 12/21/2019   Procedure: ESOPHAGOGASTRODUODENOSCOPY (EGD) WITH PROPOFOL;  Surgeon: Toney Reil, MD;  Location: [facility] ENDOSCOPY;  Service: Gastroenterology;  Laterality: N/A;   Family History: History reviewed. No pertinent family history. Family Psychiatric  History: see H&P  Social History:  Social History   Substance and Sexual Activity  Alcohol Use Yes  . Alcohol/week: 1.0 standard drink  . Types: 1 Standard drinks or equivalent per week     Social History   Substance and  Sexual Activity  Drug Use Not Currently    Social History   Socioeconomic History  . Marital status: Single    Spouse name: Not on file  . Number of children: Not on file  . Years of education: Not on file  . Highest education level: Not on file  Occupational History  . Not on file  Tobacco Use  . Smoking status: Current Every Day Smoker    Packs/day: 0.50    Years: 46.00    Pack years: 23.00    Types: Cigarettes  . Smokeless tobacco: Never Used  Vaping Use  . Vaping Use: Never used  Substance and Sexual Activity  . Alcohol use: Yes    Alcohol/week: 1.0 standard drink    Types: 1 Standard drinks or equivalent per week  . Drug use: Not Currently  . Sexual activity: Not on file  Other Topics Concern  . Not on file  Social History Narrative  . Not on file   Social Determinants of Health   Financial Resource Strain: Not on file  Food Insecurity: Not on file  Transportation Needs: Not on file  Physical Activity: Not on file  Stress: Not on file  Social Connections: Not on file   Additional Social History:    Sleep: Poor  Appetite:  Fair  Current Medications: Current Facility-Administered Medications  Medication Dose Route Frequency Provider Last Rate Last Admin  . acetaminophen (TYLENOL) tablet 650 mg  650 mg Oral Q6H PRN Clapacs, [name], MD   650 mg at 11/30/20 0804  . alum & mag hydroxide-simeth (MAALOX/MYLANTA) 200-200-20 MG/5ML suspension 30 mL  30 mL Oral Q4H PRN Clapacs, [name], MD      .  amLODipine (NORVASC) tablet 5 mg  5 mg Oral Daily Jesse Sans, MD   5 mg at 12/08/20 0749  . atorvastatin (LIPITOR) tablet 20 mg  20 mg Oral Daily Clapacs, [name], MD   20 mg at 12/08/20 0749  . benztropine (COGENTIN) tablet 1 mg  1 mg Oral BID Clapacs, [name], MD   1 mg at 12/08/20 0748  . clonazePAM (KLONOPIN) tablet 1 mg  1 mg Oral BID Clapacs, [name], MD   1 mg at 12/08/20 0749  . divalproex (DEPAKOTE) DR tablet 500 mg  500 mg Oral QHS Thalia Party, MD   500 mg at  12/07/20 2136  . divalproex (DEPAKOTE) DR tablet 500 mg  500 mg Oral q morning Jesse Sans, MD   500 mg at 12/08/20 1011  . haloperidol (HALDOL) tablet 10 mg  10 mg Oral Daily Jesse Sans, MD   10 mg at 12/08/20 0749  . haloperidol (HALDOL) tablet 20 mg  20 mg Oral QHS Jesse Sans, MD   20 mg at 12/07/20 2135  . haloperidol (HALDOL) tablet 5 mg  5 mg Oral Q6H PRN Jesse Sans, MD   5 mg at 12/05/20 2036  . hydrOXYzine (ATARAX/VISTARIL) tablet 50 mg  50 mg Oral TID PRN Audery Amel, MD   50 mg at 12/05/20 2137  . LORazepam (ATIVAN) tablet 2 mg  2 mg Oral Q6H PRN Clapacs, [name], MD   2 mg at 12/05/20 2036   Or  . LORazepam (ATIVAN) injection 2 mg  2 mg Intramuscular Q6H PRN Clapacs, [name], MD      . losartan (COZAAR) tablet 100 mg  100 mg Oral Daily Clapacs, [name], MD   100 mg at 12/08/20 0749  . magnesium hydroxide (MILK OF MAGNESIA) suspension 30 mL  30 mL Oral Daily PRN Clapacs, [name], MD      . menthol-cetylpyridinium (CEPACOL) lozenge 3 mg  1 lozenge Oral PRN Clapacs, [name], MD      . nicotine (NICODERM CQ - dosed in mg/24 hours) patch 21 mg  21 mg Transdermal Daily Clapacs, [name], MD   21 mg at 12/08/20 0749  . temazepam (RESTORIL) capsule 15 mg  15 mg Oral QHS Clapacs, [name], MD   15 mg at 12/07/20 2135    Lab Results:  Results for orders placed or performed during the hospital encounter of 11/26/20 (from the past 48 hour(s))  Glucose, capillary     Status: Abnormal   Collection Time: 12/07/20  1:36 PM  Result Value Ref Range   Glucose-Capillary 119 (H) 70 - 99 mg/dL    Comment: Glucose reference range applies only to samples taken after fasting for at least 8 hours.  Ammonia     Status: None   Collection Time: 12/07/20  2:11 PM  Result Value Ref Range   Ammonia 25 9 - 35 umol/L    Comment: Performed at [facility], [address] [place], [place] [address]  Valproic acid level     Status: None   Collection Time: 12/07/20  2:11 PM  Result  Value Ref Range   Valproic Acid Lvl 59 50.0 - 100.0 ug/mL    Comment: Performed at [facility], [address] [place], [place] [address]  Comprehensive metabolic panel     Status: Abnormal   Collection Time: 12/07/20  2:11 PM  Result Value Ref Range   Sodium 131 (L) 135 - 145 mmol/L  Potassium 4.9 3.5 - 5.1 mmol/L   Chloride 97 (L) 98 - 111 mmol/L   CO2 27 22 - 32 mmol/L   Glucose, Bld 94 70 - 99 mg/dL    Comment: Glucose reference range applies only to samples taken after fasting for at least 8 hours.   BUN 15 8 - 23 mg/dL   Creatinine, Ser 9.140.89 0.61 - 1.24 mg/dL   Calcium 9.0 8.9 - 78.210.3 mg/dL   Total Protein 6.2 (L) 6.5 - 8.1 g/dL   Albumin 3.7 3.5 - 5.0 g/dL   AST 21 15 - 41 U/L   ALT 24 0 - 44 U/L   Alkaline Phosphatase 69 38 - 126 U/L   Total Bilirubin 0.5 0.3 - 1.2 mg/dL   GFR, Estimated >95>60 >62>60 mL/min    Comment: (NOTE) Calculated using the CKD-EPI Creatinine Equation (2021)    Anion gap 7 5 - 15    Comment: Performed at [facility], [address] [place], [place] [address]    Blood Alcohol level:  Lab Results  Component Value Date   [facility]   ETH <10 09/16/2018    Metabolic Disorder Labs: Lab Results  Component Value Date   HGBA1C 5.4 11/27/2020   MPG 108.28 11/27/2020   No results found for: PROLACTIN Lab Results  Component Value Date   CHOL 140 11/27/2020   TRIG 30 11/27/2020   HDL 61 11/27/2020   CHOLHDL 2.3 11/27/2020   VLDL 6 11/27/2020   LDLCALC 73 11/27/2020    Physical Findings: AIMS:  , ,  ,  ,    CIWA:    COWS:     Musculoskeletal: Strength & Muscle Tone: within normal limits Gait & Station: unsteady Patient leans: N/A  Psychiatric Specialty Exam:  Presentation  General Appearance: Disheveled  Eye Contact:Fleeting  Speech:Garbled; Pressured  Speech Volume:Increased  Handedness:Right   Mood and Affect  Mood: "okay" Affect: Euthymic  Thought Process  Thought  Processes:Disorganized  Descriptions of Associations:Tangential  Orientation:Partial  Thought Content:Illogical; Paranoid Ideation  History of Schizophrenia/Schizoaffective disorder:Yes  Duration of Psychotic Symptoms:Greater than six months  Hallucinations: Patient denies though seen responding to internal stimuli Ideas of Reference:Percusatory  Suicidal Thoughts: denies Homicidal Thoughts: denies  Sensorium  Memory:Immediate Poor; Recent Poor; Remote Poor  Judgment:Impaired  Insight:None   Executive Functions  Concentration:Poor  Attention Span:Poor  Recall:Poor  Fund of Knowledge:Poor  Language:Poor   Psychomotor Activity  Psychomotor Activity: Normal  Assets  Assets:Resilience; Desire for Improvement   Sleep  Sleep: Poor, 4.25 hours   Physical Exam: Physical Exam  ROS  Blood pressure 123/88, pulse 79, temperature 98.1 F (36.7 C), temperature source Oral, resp. rate 18, height 6\' 1"  (1.854 m), weight 68.9 kg, SpO2 100 %. Body mass index is 20.05 kg/m.   Treatment Plan Summary: Daily contact with patient to assess and evaluate symptoms and progress in treatment and Medication management  Patient is a 65 year old male with the above-stated past psychiatric history who is seen in follow-up.  Chart reviewed. Patient discussed with nursing. Patient had episodes of aggression overnight requiring PRNs.    Plan:  -continue inpatient psych admission; 15-minute checks; daily contact with patient to assess and evaluate symptoms and progress in treatment; psychoeducation.   -continue scheduled medications: Continue Haldol 10mg  in the morning, Continue Haldol 20 mg in the evening (increased yesterday) Haldol Decanoate 150 mg IM injection given 11/15/20 prior to admission Continue Depakote 500 mg BID (increased 5/11), valproic acid level 59 on 5/13 Continue  Clonazepam 1mg  PO BID for anxiety Continue Temazepam 15mg  PO QHS for insomnia Continue  Benztropine 1mg  PO BID for EPS propx.  Continue Amlodipine 5 mg daily for HTN   . amLODipine  5 mg Oral Daily  . atorvastatin  20 mg Oral Daily  . benztropine  1 mg Oral BID  . clonazePAM  1 mg Oral BID  . divalproex  500 mg Oral QHS  . divalproex  500 mg Oral q morning  . haloperidol  10 mg Oral Daily  . haloperidol  20 mg Oral QHS  . losartan  100 mg Oral Daily  . nicotine  21 mg Transdermal Daily  . temazepam  15 mg Oral QHS     -continue PRN medications.  acetaminophen, alum & mag hydroxide-simeth, haloperidol, hydrOXYzine, LORazepam **OR** LORazepam, magnesium hydroxide, menthol-cetylpyridinium  -Consults: No new consults placed since yesterday    -Disposition: Patent is not ready for d/c yet. Patient allowed to returned to group home once stabilized. All necessary aftercare will be arranged prior to discharge.  -  I certify that the patient does need, on a daily basis, active treatment furnished directly by or requiring the supervision of inpatient psychiatric facility personnel.   12/08/20: Psychiatric exam above reviewed and remains accurate. Assessment and plan above reviewed and updated.    , MD 12/08/2020, 11:11 AM

## 2020-12-08 NOTE — Progress Notes (Signed)
[facility] LCSW Group Therapy Note  Date/Time:  12/08/2020 1:09-2:00 PM   Type of Therapy and Topic:  Group Therapy:  Healthy and Unhealthy Supports  Participation Level:  Active   Description of Group:  Patients in this group were introduced to the idea of adding a variety of healthy supports to address the various needs in their lives.Patients discussed what additional healthy supports could be helpful in their recovery and wellness after discharge in order to prevent future hospitalizations.   An emphasis was placed on using counselor, doctor, therapy groups, 12-step groups, and problem-specific support groups to expand supports.  They also worked as a group on developing a specific plan for several patients to deal with unhealthy supports through boundary-setting, psychoeducation with loved ones, and even termination of relationships.   Therapeutic Goals:   1)  discuss importance of adding supports to stay well once out of the hospital  2)  compare healthy versus unhealthy supports and identify some examples of each  3)  generate ideas and descriptions of healthy supports that can be added  4)  offer mutual support about how to address unhealthy supports  5)  encourage active participation in and adherence to discharge plan    Summary of Patient Progress:  Patient came into group and then left the room. Patient later came back and tried to fill out the social supports worksheet.   Therapeutic Modalities:   Motivational Interviewing Brief Solution-Focused Therapy  Susa Simmonds, LCSWA 12/08/2020  2:02 PM

## 2020-12-08 NOTE — Tx Team (Cosign Needed Addendum)
Interdisciplinary Treatment and Diagnostic Plan Update  12/08/2020 Time of Session: 10:30 AM Henry Black MRN: [id number]  Principal Diagnosis: Schizophrenia, undifferentiated (HCC)  Secondary Diagnoses: Principal Problem:   Schizophrenia, undifferentiated (HCC) Active Problems:   Chronic hepatitis C (HCC)   Essential hypertension   Current Medications:  Current Facility-Administered Medications  Medication Dose Route Frequency Provider Last Rate Last Admin  . acetaminophen (TYLENOL) tablet 650 mg  650 mg Oral Q6H PRN Clapacs, [name], MD   650 mg at 11/30/20 0804  . alum & mag hydroxide-simeth (MAALOX/MYLANTA) 200-200-20 MG/5ML suspension 30 mL  30 mL Oral Q4H PRN Clapacs, [name], MD      . amLODipine (NORVASC) tablet 5 mg  5 mg Oral Daily Jesse Sans, MD   5 mg at 12/08/20 0749  . atorvastatin (LIPITOR) tablet 20 mg  20 mg Oral Daily Clapacs, [name], MD   20 mg at 12/08/20 0749  . benztropine (COGENTIN) tablet 1 mg  1 mg Oral BID Clapacs, [name], MD   1 mg at 12/08/20 0748  . clonazePAM (KLONOPIN) tablet 1 mg  1 mg Oral BID Clapacs, [name], MD   1 mg at 12/08/20 0749  . divalproex (DEPAKOTE) DR tablet 500 mg  500 mg Oral QHS Thalia Party, MD   500 mg at 12/07/20 2136  . divalproex (DEPAKOTE) DR tablet 500 mg  500 mg Oral q morning Jesse Sans, MD   500 mg at 12/08/20 1011  . haloperidol (HALDOL) tablet 10 mg  10 mg Oral Daily Jesse Sans, MD   10 mg at 12/08/20 0749  . haloperidol (HALDOL) tablet 20 mg  20 mg Oral QHS Jesse Sans, MD   20 mg at 12/07/20 2135  . haloperidol (HALDOL) tablet 5 mg  5 mg Oral Q6H PRN Jesse Sans, MD   5 mg at 12/05/20 2036  . hydrOXYzine (ATARAX/VISTARIL) tablet 50 mg  50 mg Oral TID PRN Audery Amel, MD   50 mg at 12/05/20 2137  . LORazepam (ATIVAN) tablet 2 mg  2 mg Oral Q6H PRN Clapacs, [name], MD   2 mg at 12/05/20 2036   Or  . LORazepam (ATIVAN) injection 2 mg  2 mg Intramuscular Q6H PRN Clapacs, [name], MD      .  losartan (COZAAR) tablet 100 mg  100 mg Oral Daily Clapacs, [name], MD   100 mg at 12/08/20 0749  . magnesium hydroxide (MILK OF MAGNESIA) suspension 30 mL  30 mL Oral Daily PRN Clapacs, [name], MD      . menthol-cetylpyridinium (CEPACOL) lozenge 3 mg  1 lozenge Oral PRN Clapacs, [name], MD      . nicotine (NICODERM CQ - dosed in mg/24 hours) patch 21 mg  21 mg Transdermal Daily Clapacs, [name], MD   21 mg at 12/08/20 0749  . temazepam (RESTORIL) capsule 15 mg  15 mg Oral QHS Clapacs, [name], MD   15 mg at 12/07/20 2135   PTA Medications: Medications Prior to Admission  Medication Sig Dispense Refill Last Dose  . atorvastatin (LIPITOR) 20 MG tablet Take 20 mg by mouth daily.     . benztropine (COGENTIN) 2 MG tablet Take 2 mg by mouth 2 (two) times daily.     . Cholecalciferol 2000 units CAPS Take 2,000 Units by mouth daily.     . clonazePAM (KLONOPIN) 1 MG tablet Take 1 mg by mouth 2 (two) times daily.     Marland Kitchen  haloperidol decanoate (HALDOL DECANOATE) 100 MG/ML injection Inject 150 mg into the muscle every 28 (twenty-eight) days.     Marland Kitchen losartan (COZAAR) 100 MG tablet Take 100 mg by mouth daily.     . LUCEMYRA 0.18 MG TABS      . Sofosbuvir-Velpatasvir (EPCLUSA) 400-100 MG TABS Take 1 tablet by mouth daily. 28 tablet 2     Patient Stressors: Educational concerns Medication change or noncompliance  Patient Strengths: Physical Health Supportive family/friends  Treatment Modalities: Medication Management, Group therapy, Case management,  1 to 1 session with clinician, Psychoeducation, Recreational therapy.   Physician Treatment Plan for Primary Diagnosis: Schizophrenia, undifferentiated (HCC) Long Term Goal(s): Improvement in symptoms so as ready for discharge Improvement in symptoms so as ready for discharge   Short Term Goals: Ability to verbalize feelings will improve Ability to demonstrate self-control will improve Ability to identify and develop effective coping behaviors will  improve Ability to maintain clinical measurements within normal limits will improve Compliance with prescribed medications will improve  Medication Management: Evaluate patient's response, side effects, and tolerance of medication regimen.  Therapeutic Interventions: 1 to 1 sessions, Unit Group sessions and Medication administration.  Evaluation of Outcomes: Progressing  Physician Treatment Plan for Secondary Diagnosis: Principal Problem:   Schizophrenia, undifferentiated (HCC) Active Problems:   Chronic hepatitis C (HCC)   Essential hypertension  Long Term Goal(s): Improvement in symptoms so as ready for discharge Improvement in symptoms so as ready for discharge   Short Term Goals: Ability to verbalize feelings will improve Ability to demonstrate self-control will improve Ability to identify and develop effective coping behaviors will improve Ability to maintain clinical measurements within normal limits will improve Compliance with prescribed medications will improve     Medication Management: Evaluate patient's response, side effects, and tolerance of medication regimen.  Therapeutic Interventions: 1 to 1 sessions, Unit Group sessions and Medication administration.  Evaluation of Outcomes: Progressing   RN Treatment Plan for Primary Diagnosis: Schizophrenia, undifferentiated (HCC) Long Term Goal(s): Knowledge of disease and therapeutic regimen to maintain health will improve  Short Term Goals: Ability to remain free from injury will improve, Ability to verbalize frustration and anger appropriately will improve, Ability to demonstrate self-control, Ability to participate in decision making will improve, Ability to verbalize feelings will improve, Ability to identify and develop effective coping behaviors will improve and Compliance with prescribed medications will improve  Medication Management: RN will administer medications as ordered by provider, will assess and evaluate  patient's response and provide education to patient for prescribed medication. RN will report any adverse and/or side effects to prescribing provider.  Therapeutic Interventions: 1 on 1 counseling sessions, Psychoeducation, Medication administration, Evaluate responses to treatment, Monitor vital signs and CBGs as ordered, Perform/monitor CIWA, COWS, AIMS and Fall Risk screenings as ordered, Perform wound care treatments as ordered.  Evaluation of Outcomes: Progressing   LCSW Treatment Plan for Primary Diagnosis: Schizophrenia, undifferentiated (HCC) Long Term Goal(s): Safe transition to appropriate next level of care at discharge, Engage patient in therapeutic group addressing interpersonal concerns.  Short Term Goals: Engage patient in aftercare planning with referrals and resources, Increase social support, Increase ability to appropriately verbalize feelings, Increase emotional regulation, Facilitate acceptance of mental health diagnosis and concerns, Facilitate patient progression through stages of change regarding substance use diagnoses and concerns, Identify triggers associated with mental health/substance abuse issues and Increase skills for wellness and recovery  Therapeutic Interventions: Assess for all discharge needs, 1 to 1 time with Social worker, Explore available resources  and support systems, Assess for adequacy in community support network, Educate family and significant other(s) on suicide prevention, Complete Psychosocial Assessment, Interpersonal group therapy.  Evaluation of Outcomes: Progressing   Progress in Treatment: Attending groups: No. Participating in groups: No. Taking medication as prescribed: Yes. Toleration medication: Yes. Family/Significant other contact made: Yes, individual(s) contacted:  Drucilla Schmidt, [facility] staff member Patient understands diagnosis: No. Discussing patient identified problems/goals with staff: No. Medical problems  stabilized or resolved: Yes. Denies suicidal/homicidal ideation: Yes. Issues/concerns per patient self-inventory: No. Other: None   New problem(s) identified: No, Describe:  None   New Short Term/Long Term Goal(s): elimination of symptoms of psychosis, medication management for mood stabilization; elimination of SI thoughts; development of comprehensive mental wellness/sobriety plan. Update 12/03/20: No changes at this time.  Update 12/08/20: No changes at this time   Patient Goals:  "To get these wet clothes off." Pt was reported to have been incontinent this morning but had been doing better. Staff to remind pt to utilize the bathroom. Update 12/03/20: Pt continues to have some incontinence. He was agitated this morning and given medication to help him calm down. Update 12/08/20: No changes at this time      Discharge Plan or Barriers: CSW will assist with development of an appropriate discharge plan. Update 12/03/20: CSW confirmed that pt can return to his care home facility once he is at baseline.Update 12/08/20: No changes at this time   Reason for Continuation of Hospitalization: Medication stabilization  Estimated Length of Stay:TBD  Attendees: Patient: 12/08/2020 12:01 PM  Physician: Les Pou, MD  12/08/2020 12:01 PM  Nursing:  12/08/2020 12:01 PM  RN Care Manager: 12/08/2020 12:01 PM  Social Worker: Susa Simmonds, LCSWA 12/08/2020 12:01 PM  Recreational Therapist:  12/08/2020 12:01 PM  Other:  12/08/2020 12:01 PM  Other:  12/08/2020 12:01 PM  Other: 12/08/2020 12:01 PM    Scribe for Treatment Team: Susa Simmonds, LCSWA 12/08/2020 12:01 PM

## 2020-12-09 NOTE — Plan of Care (Signed)
  Problem: Education: Goal: Will be free of psychotic symptoms Outcome: Progressing   Problem: Health Behavior/Discharge Planning: Goal: Compliance with prescribed medication regimen will improve Outcome: Progressing   Problem: Safety: Goal: Ability to remain free from injury will improve Outcome: Progressing   

## 2020-12-09 NOTE — Progress Notes (Signed)
Went outside for recreation. Playing basketball, running, jumping and shooting hoops without walker. Interacting with peers, encouraging others to play and giving them pointers on how to make a basket

## 2020-12-09 NOTE — Progress Notes (Signed)
[facility] MD Progress Note  12/09/2020 11:14 AM Henry Black  MRN:  [id number]  Principal Problem: Schizophrenia, undifferentiated (HCC) Diagnosis: Principal Problem:   Schizophrenia, undifferentiated (HCC) Active Problems:   Chronic hepatitis C (HCC)   Essential hypertension  Total Time spent with patient: 20 minutes  CC: "I'm okay"  Henry Black is a  65y.o. male patient with Schizophrenia, who presents to the [facility] unit for treatment of psychosis in the context of severe agitation and dangerous behavior.    Interval History Patient was seen today for re-evaluation. No acute events overnight, ADLs improved.  Patient has been medication compliant.  Subjective: This morning patient ambulating without difficulty, and coherent. He notes he is feeling okay today. He points out some new patients on the unit, and notes he is making friends. He denies any Si/HI/AH/VH. Still observed responding to internal stimuli on occasion.    Labs: Ammonia 25, CMP grossly normal aside from mildly low sodium 131, choloride 4.9, and total protein 6.2. Valproic acid level 59  Past Psychiatric History: see H&P   Past Medical History:  Past Medical History:  Diagnosis Date  . Hepatitis   . Hypertension     Past Surgical History:  Procedure Laterality Date  . ESOPHAGOGASTRODUODENOSCOPY (EGD) WITH PROPOFOL N/A 12/21/2019   Procedure: ESOPHAGOGASTRODUODENOSCOPY (EGD) WITH PROPOFOL;  Surgeon: Toney Reil, MD;  Location: [facility] ENDOSCOPY;  Service: Gastroenterology;  Laterality: N/A;   Family History: History reviewed. No pertinent family history. Family Psychiatric  History: see H&P  Social History:  Social History   Substance and Sexual Activity  Alcohol Use Yes  . Alcohol/week: 1.0 standard drink  . Types: 1 Standard drinks or equivalent per week     Social History   Substance and Sexual Activity  Drug Use Not Currently    Social History   Socioeconomic History  . Marital status: Single     Spouse name: Not on file  . Number of children: Not on file  . Years of education: Not on file  . Highest education level: Not on file  Occupational History  . Not on file  Tobacco Use  . Smoking status: Current Every Day Smoker    Packs/day: 0.50    Years: 46.00    Pack years: 23.00    Types: Cigarettes  . Smokeless tobacco: Never Used  Vaping Use  . Vaping Use: Never used  Substance and Sexual Activity  . Alcohol use: Yes    Alcohol/week: 1.0 standard drink    Types: 1 Standard drinks or equivalent per week  . Drug use: Not Currently  . Sexual activity: Not on file  Other Topics Concern  . Not on file  Social History Narrative  . Not on file   Social Determinants of Health   Financial Resource Strain: Not on file  Food Insecurity: Not on file  Transportation Needs: Not on file  Physical Activity: Not on file  Stress: Not on file  Social Connections: Not on file   Additional Social History:    Sleep: Fair  Appetite:  Fair  Current Medications: Current Facility-Administered Medications  Medication Dose Route Frequency Provider Last Rate Last Admin  . acetaminophen (TYLENOL) tablet 650 mg  650 mg Oral Q6H PRN Clapacs, [name], MD   650 mg at 12/09/20 1017  . alum & mag hydroxide-simeth (MAALOX/MYLANTA) 200-200-20 MG/5ML suspension 30 mL  30 mL Oral Q4H PRN Clapacs, [name], MD      . amLODipine (NORVASC) tablet 5 mg  5 mg  Oral Daily Jesse SansFreeman, Darleen Moffitt M, MD   5 mg at 12/08/20 0749  . atorvastatin (LIPITOR) tablet 20 mg  20 mg Oral Daily Clapacs, Jackquline DenmarkJohn T, MD   20 mg at 12/09/20 [id number]  . benztropine (COGENTIN) tablet 1 mg  1 mg Oral BID Clapacs, Jackquline DenmarkJohn T, MD   1 mg at 12/09/20 [id number]  . clonazePAM (KLONOPIN) tablet 1 mg  1 mg Oral BID Clapacs, Jackquline DenmarkJohn T, MD   1 mg at 12/09/20 [id number]  . divalproex (DEPAKOTE) DR tablet 500 mg  500 mg Oral QHS Thalia PartyPaliy, Alisa, MD   500 mg at 12/08/20 2200  . divalproex (DEPAKOTE) DR tablet 500 mg  500 mg Oral q morning Jesse SansFreeman, Alfonsa Vaile M, MD   500 mg at  12/09/20 0945  . haloperidol (HALDOL) tablet 10 mg  10 mg Oral Daily Jesse SansFreeman, Jerson Furukawa M, MD   10 mg at 12/09/20 [id number]  . haloperidol (HALDOL) tablet 20 mg  20 mg Oral QHS Jesse SansFreeman, Dustina Scoggin M, MD   20 mg at 12/08/20 2159  . haloperidol (HALDOL) tablet 5 mg  5 mg Oral Q6H PRN Jesse SansFreeman, Samarrah Tranchina M, MD   5 mg at 12/05/20 2036  . hydrOXYzine (ATARAX/VISTARIL) tablet 50 mg  50 mg Oral TID PRN Audery Amellapacs, [name], MD   50 mg at 12/05/20 2137  . LORazepam (ATIVAN) tablet 2 mg  2 mg Oral Q6H PRN Clapacs, Jackquline DenmarkJohn T, MD   2 mg at 12/05/20 2036   Or  . LORazepam (ATIVAN) injection 2 mg  2 mg Intramuscular Q6H PRN Clapacs, [name], MD      . losartan (COZAAR) tablet 100 mg  100 mg Oral Daily Clapacs, Jackquline DenmarkJohn T, MD   100 mg at 12/08/20 0749  . magnesium hydroxide (MILK OF MAGNESIA) suspension 30 mL  30 mL Oral Daily PRN Clapacs, [name], MD      . menthol-cetylpyridinium (CEPACOL) lozenge 3 mg  1 lozenge Oral PRN Clapacs, [name], MD      . nicotine (NICODERM CQ - dosed in mg/24 hours) patch 21 mg  21 mg Transdermal Daily Clapacs, Jackquline DenmarkJohn T, MD   21 mg at 12/09/20 [id number]  . temazepam (RESTORIL) capsule 15 mg  15 mg Oral QHS Clapacs, Jackquline DenmarkJohn T, MD   15 mg at 12/08/20 2159    Lab Results:  Results for orders placed or performed during the hospital encounter of 11/26/20 (from the past 48 hour(s))  Glucose, capillary     Status: Abnormal   Collection Time: 12/07/20  1:36 PM  Result Value Ref Range   Glucose-Capillary 119 (H) 70 - 99 mg/dL    Comment: Glucose reference range applies only to samples taken after fasting for at least 8 hours.  Ammonia     Status: None   Collection Time: 12/07/20  2:11 PM  Result Value Ref Range   Ammonia 25 9 - 35 umol/L    Comment: Performed at [facility], [address] [place], [place] [address]  Valproic acid level     Status: None   Collection Time: 12/07/20  2:11 PM  Result Value Ref Range   Valproic Acid Lvl 59 50.0 - 100.0 ug/mL    Comment: Performed at [facility], [address] [place], [place] [address]  Comprehensive metabolic panel     Status: Abnormal   Collection Time: 12/07/20  2:11 PM  Result Value Ref Range   Sodium 131 (L) 135 - 145 mmol/L   Potassium 4.9 3.5 - 5.1 mmol/L  Chloride 97 (L) 98 - 111 mmol/L   CO2 27 22 - 32 mmol/L   Glucose, Bld 94 70 - 99 mg/dL    Comment: Glucose reference range applies only to samples taken after fasting for at least 8 hours.   BUN 15 8 - 23 mg/dL   Creatinine, Ser 5.03 0.61 - 1.24 mg/dL   Calcium 9.0 8.9 - 54.6 mg/dL   Total Protein 6.2 (L) 6.5 - 8.1 g/dL   Albumin 3.7 3.5 - 5.0 g/dL   AST 21 15 - 41 U/L   ALT 24 0 - 44 U/L   Alkaline Phosphatase 69 38 - 126 U/L   Total Bilirubin 0.5 0.3 - 1.2 mg/dL   GFR, Estimated >56 >81 mL/min    Comment: (NOTE) Calculated using the CKD-EPI Creatinine Equation (2021)    Anion gap 7 5 - 15    Comment: Performed at [facility], [address] [place], [place] [address]    Blood Alcohol level:  Lab Results  Component Value Date   [facility] <10 11/25/2020   ETH <10 09/16/2018    Metabolic Disorder Labs: Lab Results  Component Value Date   HGBA1C 5.4 11/27/2020   MPG 108.28 11/27/2020   No results found for: PROLACTIN Lab Results  Component Value Date   CHOL 140 11/27/2020   TRIG 30 11/27/2020   HDL 61 11/27/2020   CHOLHDL 2.3 11/27/2020   VLDL 6 11/27/2020   LDLCALC 73 11/27/2020    Physical Findings: AIMS:  , ,  ,  ,    CIWA:    COWS:     Musculoskeletal: Strength & Muscle Tone: within normal limits Gait & Station: unsteady Patient leans: N/A  Psychiatric Specialty Exam:  Presentation  General Appearance: Disheveled  Eye Contact:Fleeting  Speech:Garbled; Pressured  Speech Volume:Increased  Handedness:Right   Mood and Affect  Mood: "okay" Affect: Euthymic  Thought Process  Thought Processes:Disorganized  Descriptions of Associations:Tangential  Orientation:Partial  Thought Content:Illogical; Paranoid  Ideation  History of Schizophrenia/Schizoaffective disorder:Yes  Duration of Psychotic Symptoms:Greater than six months  Hallucinations: Patient denies though seen responding to internal stimuli Ideas of Reference:Percusatory  Suicidal Thoughts: denies Homicidal Thoughts: denies  Sensorium  Memory:Immediate Poor; Recent Poor; Remote Poor  Judgment:Impaired  Insight:None   Executive Functions  Concentration:Poor  Attention Span:Poor  Recall:Poor  Fund of Knowledge:Poor  Language:Poor   Psychomotor Activity  Psychomotor Activity: Normal  Assets  Assets:Resilience; Desire for Improvement   Sleep  Sleep: Fair, 7 hours   Physical Exam: Physical Exam  ROS  Blood pressure 119/77, pulse 76, temperature 97.8 F (36.6 C), temperature source Oral, resp. rate 18, height 6\' 1"  (1.854 m), weight 68.9 kg, SpO2 100 %. Body mass index is 20.05 kg/m.   Treatment Plan Summary: Daily contact with patient to assess and evaluate symptoms and progress in treatment and Medication management  Patient is a 65 year old male with the above-stated past psychiatric history who is seen in follow-up.  Chart reviewed. Patient discussed with nursing. Patient had episodes of aggression overnight requiring PRNs.    Plan:  -continue inpatient psych admission; 15-minute checks; daily contact with patient to assess and evaluate symptoms and progress in treatment; psychoeducation.   -continue scheduled medications: Continue Haldol 10mg  in the morning, Continue Haldol 20 mg in the evening (increased yesterday) Haldol Decanoate 150 mg IM injection given 11/15/20 prior to admission Continue Depakote 500 mg BID (increased 5/11), valproic acid level 59 on 5/13 Continue Clonazepam 1mg  PO BID for anxiety Continue Temazepam  15mg  PO QHS for insomnia Continue Benztropine 1mg  PO BID for EPS propx.  Continue Amlodipine 5 mg daily for HTN   . amLODipine  5 mg Oral Daily  . atorvastatin  20 mg Oral  Daily  . benztropine  1 mg Oral BID  . clonazePAM  1 mg Oral BID  . divalproex  500 mg Oral QHS  . divalproex  500 mg Oral q morning  . haloperidol  10 mg Oral Daily  . haloperidol  20 mg Oral QHS  . losartan  100 mg Oral Daily  . nicotine  21 mg Transdermal Daily  . temazepam  15 mg Oral QHS     -continue PRN medications.  acetaminophen, alum & mag hydroxide-simeth, haloperidol, hydrOXYzine, LORazepam **OR** LORazepam, magnesium hydroxide, menthol-cetylpyridinium  -Consults: No new consults placed since yesterday    -Disposition: Patent is not ready for d/c yet. Patient allowed to returned to group home once stabilized. All necessary aftercare will be arranged prior to discharge.  -  I certify that the patient does need, on a daily basis, active treatment furnished directly by or requiring the supervision of inpatient psychiatric facility personnel.   12/09/20: Psychiatric exam above reviewed and remains accurate. Assessment and plan above reviewed and updated.     , MD 12/09/2020, 11:14 AM

## 2020-12-09 NOTE — Progress Notes (Signed)
Patient is more pleasant and cooperative. Now off 1:1 spends a lot of time in the dayroom and engages well with other patients on the unit. He denies SI  HI  AVH depression anxiety or pain. He reports eating and sleeping well and is med compliant this evening.  He continues to be monitored with 15 minute safety checks and was encouraged to come to staff with any concerns.     Cleo Butler-Nicholson, LPN

## 2020-12-09 NOTE — Progress Notes (Signed)
Patient is pleasant and cooperative. He has been active all day on the unit and is engaging well with others. He denies SI  HI AVH depression and anxiety at this encounter.  He does have complaint of headache and rates his pain at 10/10. He received his QHS PRN and ordered meds and tolerated without incident. He remains safe on the unit with 15 minute safety rounds and was encouraged to come to staff with any concerns.    Cleo Butler-Nicholson, LPN

## 2020-12-09 NOTE — Progress Notes (Signed)
   LCSW Group Therapy Note    12/09/2020: 1:08-1:45 PM     Type of Therapy and Topic:  Group Therapy:  Overcoming Obstacles     Participation Level:  None     Description of Group:     In this group patients will be encouraged to explore what they see as obstacles to their own wellness and recovery. They will be guided to discuss their thoughts, feelings, and behaviors related to these obstacles. The group will process together ways to cope with barriers, with attention given to specific choices patients can make. Each patient will be challenged to identify changes they are motivated to make in order to overcome their obstacles. This group will be process-oriented, with patients participating in exploration of their own experiences as well as giving and receiving support and challenge from other group members.     Therapeutic Goals:  1.    Patient will identify personal and current obstacles as they relate to admission.  2.    Patient will identify barriers that currently interfere with their wellness or overcoming obstacles.  3.    Patient will identify feelings, thought process and behaviors related to these barriers.  4.    Patient will identify two changes they are willing to make to overcome these obstacles:        Summary of Patient Progress: Patient present in group but did not participate.          Therapeutic Modalities:    Cognitive Behavioral Therapy  Solution Focused Therapy  Motivational Interviewing  Relapse Prevention Therapy    Susa Simmonds, LCSWA   12/09/2020

## 2020-12-09 NOTE — Progress Notes (Signed)
Morning blood pressure medication held because patient's blood pressure was 109/70. Was observed looking out of his window and dancing. Pleasant and cooperative. Speech is still slurred and difficult to understand. Observed responding to internal stimuli in the day room. Denies SI, HI and AVH

## 2020-12-10 LAB — RESP PANEL BY RT-PCR (FLU A&B, COVID) ARPGX2
Influenza A by PCR: NEGATIVE
Influenza B by PCR: NEGATIVE
SARS Coronavirus 2 by RT PCR: NEGATIVE

## 2020-12-10 MED ORDER — HALOPERIDOL 10 MG PO TABS: ORAL_TABLET | ORAL | 1 refills | Status: AC

## 2020-12-10 MED ORDER — DIVALPROEX SODIUM 500 MG PO DR TAB: 500.0000 mg | DELAYED_RELEASE_TABLET | Freq: Two times a day (BID) | ORAL | 1 refills | Status: AC

## 2020-12-10 MED ORDER — AMLODIPINE BESYLATE 5 MG PO TABS: 5.0000 mg | ORAL_TABLET | Freq: Every day | ORAL | 1 refills | Status: AC

## 2020-12-10 NOTE — [facility] Group Notes (Signed)
LCSW Group Therapy Note     12/10/2020 2:05 PM     Type of Therapy and Topic:  Group Therapy:  Overcoming Obstacles     Participation Level:  Minimal     Description of Group:     In this group patients will be encouraged to explore what they see as obstacles to their own wellness and recovery. They will be guided to discuss their thoughts, feelings, and behaviors related to these obstacles. The group will process together ways to cope with barriers, with attention given to specific choices patients can make. Each patient will be challenged to identify changes they are motivated to make in order to overcome their obstacles. This group will be process-oriented, with patients participating in exploration of their own experiences as well as giving and receiving support and challenge from other group members.     Therapeutic Goals:  1.    Patient will identify personal and current obstacles as they relate to admission.  2.    Patient will identify barriers that currently interfere with their wellness or overcoming obstacles.  3.    Patient will identify feelings, thought process and behaviors related to these barriers.  4.    Patient will identify two changes they are willing to make to overcome these obstacles:        Summary of Patient Progress: Patient was present for some of the group session. Patient was an active listener but did not discuss anything during the session and left during the majority of the session .     Therapeutic Modalities:    Cognitive Behavioral Therapy  Solution Focused Therapy  Motivational Interviewing  Relapse Prevention Therapy     Darsi Tien [place], MSW, LCSW-A  12/10/2020 2:05 PM

## 2020-12-10 NOTE — Progress Notes (Signed)
Patient presents responding to internal stimuli, patient observed having a conversation with someone who isn't there. Patient pleasant denies SI/HI/AVH. Patient observed interacting appropriately with peers and staff on the unit. Patient compliant with medication administration per MD orders. Patient given education, support, and encouragement to be active in his treatment plan. Patient being monitored Q 15 minutes for safety per unit protocol. Pt remains safe on the unit.

## 2020-12-10 NOTE — Plan of Care (Signed)
Patient compliant with procedures on the unit  Problem: Health Behavior/Discharge Planning: Goal: Compliance with prescribed medication regimen will improve Outcome: Progressing

## 2020-12-10 NOTE — Progress Notes (Signed)
D: Pt alert and oriented. Pt denies experiencing any anxiety/depression at this time. Pt denies experiencing any pain at this time. Pt denies experiencing any SI/HI, or AVH at this time, however is observed throughout the day responding to internal stimuli.   Pt appear more sluggish than usual today and less conversative, MD notified, vitals were taken and WNL. Pt appears sullen and irritable. This Clinical research associate cleared pt's room of garbage and extra linens and provided a linen change. Pt appeared annoyed with this. When pt received his COVID swab pt stated get out, when this writer asked if he wanted this Clinical research associate to leave the pt stated not you that man over there, get out. Pt was also weary about taking his evening med and would only take them if the MHT was observing them being given.   A: Scheduled medications administered to pt, per MD orders. Support and encouragement provided. Frequent verbal contact made. Routine safety checks conducted q15 minutes.   R: No adverse drug reactions noted. Pt verbally contracts for safety at this time. Pt complaint with medications. Pt interacts well with others on the unit. Pt remains safe at this time. Will continue to monitor.

## 2020-12-10 NOTE — Progress Notes (Signed)
[facility] MD Progress Note  12/10/2020 1:06 PM Cole Eastridge  MRN:  [id number]  Principal Problem: Schizophrenia, undifferentiated (HCC) Diagnosis: Principal Problem:   Schizophrenia, undifferentiated (HCC) Active Problems:   Chronic hepatitis C (HCC)   Essential hypertension  Total Time spent with patient: 20 minutes  CC: "I'm good"  Mr. Henry Black is a  65y.o. male patient with Schizophrenia, who presents to the [facility] unit for treatment of psychosis in the context of severe agitation and dangerous behavior.    Interval History Patient was seen today for re-evaluation. No acute events overnight, ADLs improved.  Patient has been medication compliant.  Subjective: Yesterday afternoon patient was playing basketball with peers enjoying himself. He has been more interactive with staff and peers. Voice is clear and coherent this morning. Overall much improved since admission. He denies SI/HI/AH/VH. Denies medication side effects.   Contacted Drucilla Schmidt [phone] to provide update. She states they will be able to accept him back to group home on Weds, May 18th at 10AM. She requests updated FL2 form, updated covid test, and for prescriptions to be sent to [facility]. She has no other questions or concerns at this time.   Past Psychiatric History: see H&P   Past Medical History:  Past Medical History:  Diagnosis Date  . Hepatitis   . Hypertension     Past Surgical History:  Procedure Laterality Date  . ESOPHAGOGASTRODUODENOSCOPY (EGD) WITH PROPOFOL N/A 12/21/2019   Procedure: ESOPHAGOGASTRODUODENOSCOPY (EGD) WITH PROPOFOL;  Surgeon: Toney Reil, MD;  Location: [facility] ENDOSCOPY;  Service: Gastroenterology;  Laterality: N/A;   Family History: History reviewed. No pertinent family history. Family Psychiatric  History: see H&P  Social History:  Social History   Substance and Sexual Activity  Alcohol Use Yes  . Alcohol/week: 1.0 standard drink  . Types: 1 Standard drinks or  equivalent per week     Social History   Substance and Sexual Activity  Drug Use Not Currently    Social History   Socioeconomic History  . Marital status: Single    Spouse name: Not on file  . Number of children: Not on file  . Years of education: Not on file  . Highest education level: Not on file  Occupational History  . Not on file  Tobacco Use  . Smoking status: Current Every Day Smoker    Packs/day: 0.50    Years: 46.00    Pack years: 23.00    Types: Cigarettes  . Smokeless tobacco: Never Used  Vaping Use  . Vaping Use: Never used  Substance and Sexual Activity  . Alcohol use: Yes    Alcohol/week: 1.0 standard drink    Types: 1 Standard drinks or equivalent per week  . Drug use: Not Currently  . Sexual activity: Not on file  Other Topics Concern  . Not on file  Social History Narrative  . Not on file   Social Determinants of Health   Financial Resource Strain: Not on file  Food Insecurity: Not on file  Transportation Needs: Not on file  Physical Activity: Not on file  Stress: Not on file  Social Connections: Not on file   Additional Social History:    Sleep: Fair  Appetite:  Fair  Current Medications: Current Facility-Administered Medications  Medication Dose Route Frequency Provider Last Rate Last Admin  . acetaminophen (TYLENOL) tablet 650 mg  650 mg Oral Q6H PRN Clapacs, [name], MD   650 mg at 12/09/20 2123  . alum & mag hydroxide-simeth (MAALOX/MYLANTA) 200-200-20 MG/5ML  suspension 30 mL  30 mL Oral Q4H PRN Clapacs, [name], MD      . amLODipine (NORVASC) tablet 5 mg  5 mg Oral Daily Jesse Sans, MD   5 mg at 12/10/20 0801  . atorvastatin (LIPITOR) tablet 20 mg  20 mg Oral Daily Clapacs, [name], MD   20 mg at 12/10/20 0801  . benztropine (COGENTIN) tablet 1 mg  1 mg Oral BID Clapacs, [name], MD   1 mg at 12/10/20 0801  . clonazePAM (KLONOPIN) tablet 1 mg  1 mg Oral BID Clapacs, [name], MD   1 mg at 12/10/20 0801  . divalproex (DEPAKOTE) DR  tablet 500 mg  500 mg Oral QHS Thalia Party, MD   500 mg at 12/09/20 2122  . divalproex (DEPAKOTE) DR tablet 500 mg  500 mg Oral q morning Jesse Sans, MD   500 mg at 12/10/20 0258  . haloperidol (HALDOL) tablet 10 mg  10 mg Oral Daily Jesse Sans, MD   10 mg at 12/10/20 0801  . haloperidol (HALDOL) tablet 20 mg  20 mg Oral QHS Jesse Sans, MD   20 mg at 12/09/20 2123  . hydrOXYzine (ATARAX/VISTARIL) tablet 50 mg  50 mg Oral TID PRN Audery Amel, MD   50 mg at 12/05/20 2137  . losartan (COZAAR) tablet 100 mg  100 mg Oral Daily Clapacs, [name], MD   100 mg at 12/10/20 0801  . magnesium hydroxide (MILK OF MAGNESIA) suspension 30 mL  30 mL Oral Daily PRN Clapacs, [name], MD      . nicotine (NICODERM CQ - dosed in mg/24 hours) patch 21 mg  21 mg Transdermal Daily Clapacs, [name], MD   21 mg at 12/10/20 0803  . temazepam (RESTORIL) capsule 15 mg  15 mg Oral QHS Clapacs, [name], MD   15 mg at 12/09/20 2123    Lab Results:  No results found for this or any previous visit (from the past 48 hour(s)).  Blood Alcohol level:  Lab Results  Component Value Date   ETH <10 11/25/2020   ETH <10 09/16/2018    Metabolic Disorder Labs: Lab Results  Component Value Date   HGBA1C 5.4 11/27/2020   MPG 108.28 11/27/2020   No results found for: PROLACTIN Lab Results  Component Value Date   CHOL 140 11/27/2020   TRIG 30 11/27/2020   HDL 61 11/27/2020   CHOLHDL 2.3 11/27/2020   VLDL 6 11/27/2020   LDLCALC 73 11/27/2020    Physical Findings: AIMS:  , ,  ,  ,    CIWA:    COWS:     Musculoskeletal: Strength & Muscle Tone: within normal limits Gait & Station: unsteady Patient leans: N/A  Psychiatric Specialty Exam:  Presentation  General Appearance: Casual Eye Contact:Good Speech:Normal rate Speech Volume:Normal Handedness:Right   Mood and Affect  Mood: "okay" Affect: Euthymic  Thought Process  Thought Processes:More linear today Descriptions of  Associations:Intact Orientation:Partial  Thought Content:Logical History of Schizophrenia/Schizoaffective disorder:Yes  Duration of Psychotic Symptoms:Greater than six months  Hallucinations: Denies Ideas of Reference:None Suicidal Thoughts: denies Homicidal Thoughts: denies  Sensorium  Memory:Immediate Poor; Recent Poor; Remote Poor  Judgment:Intact Insight:Present  Executive Functions  Concentration:Fair Attention Span:Fair Recall:Poor  Fund of Knowledge:Fair Language:Fair  Psychomotor Activity  Psychomotor Activity: Normal  Assets  Assets:Resilience; Desire for Improvement   Sleep  Sleep: Fair, 6.5 hours   Physical Exam: Physical Exam  ROS  Blood pressure 139/78, pulse  88, temperature 98 F (36.7 C), temperature source Oral, resp. rate 18, height 6\' 1"  (1.854 m), weight 68.9 kg, SpO2 100 %. Body mass index is 20.05 kg/m.   Treatment Plan Summary: Daily contact with patient to assess and evaluate symptoms and progress in treatment and Medication management  Patient is a 65 year old male with the above-stated past psychiatric history who is seen in follow-up.  Chart reviewed. Patient discussed with nursing. Overall patient showing signs of improvement.    Plan:  -continue inpatient psych admission; 15-minute checks; daily contact with patient to assess and evaluate symptoms and progress in treatment; psychoeducation.   -continue scheduled medications: Continue Haldol 10mg  in the morning, 20 mg in the evening  Haldol Decanoate 150 mg IM injection given 11/15/20 prior to admission Continue Depakote 500 mg BID, valproic acid level 59 on 5/13 Continue Clonazepam 1mg  PO BID for anxiety Continue Benztropine 1mg  PO BID for EPS propx.  Continue Amlodipine 5 mg daily for HTN   . amLODipine  5 mg Oral Daily  . atorvastatin  20 mg Oral Daily  . benztropine  1 mg Oral BID  . clonazePAM  1 mg Oral BID  . divalproex  500 mg Oral QHS  . divalproex  500 mg Oral  q morning  . haloperidol  10 mg Oral Daily  . haloperidol  20 mg Oral QHS  . losartan  100 mg Oral Daily  . nicotine  21 mg Transdermal Daily  . temazepam  15 mg Oral QHS     -continue PRN medications.  acetaminophen, alum & mag hydroxide-simeth, hydrOXYzine, magnesium hydroxide  -Consults: No new consults placed since yesterday    -Disposition: Patent is not ready for d/c yet. Patient allowed to returned to group home once stabilized. All necessary aftercare will be arranged prior to discharge.  -  I certify that the patient does need, on a daily basis, active treatment furnished directly by or requiring the supervision of inpatient psychiatric facility personnel.   12/10/20: Psychiatric exam above reviewed and remains accurate. Assessment and plan above reviewed and updated.    6/13, MD 12/10/2020, 1:06 PM

## 2020-12-10 NOTE — [place] FL2 (Addendum)
[place] MEDICAID FL2 LEVEL OF CARE SCREENING TOOL     IDENTIFICATION  Patient Name: Henry Black Birthdate: 15-Jul-1956 Sex: male Admission Date (Current Location): 11/26/2020  [place] and [place] Number:  Chiropodist and Address:  [facility], [address], [place], [place] [address]      Provider Number: [id number]  Attending Physician Name and Address:  Jesse Sans, MD  Relative Name and Phone Number:       Current Level of Care: Hospital Recommended Level of Care: Other (Comment) (Group Home) Prior Approval Number:    Date Approved/Denied:   PASRR Number:    Discharge Plan: Other (Comment) (Group Home)    Current Diagnoses: Patient Active Problem List   Diagnosis Date Noted  . Essential hypertension 11/27/2020  . Schizoaffective disorder (HCC) 11/26/2020  . Schizophrenia, undifferentiated (HCC) 11/26/2020  . Chronic hepatitis C (HCC) 03/29/2020  . Hepatic cirrhosis (HCC) 03/29/2020  . Aggressive behavior 09/17/2018    Orientation RESPIRATION BLADDER Height & Weight     Self,Time,Situation,Place  Normal Continent Weight: 68.9 kg Height:  6\' 1"  (185.4 cm)  BEHAVIORAL SYMPTOMS/MOOD NEUROLOGICAL BOWEL NUTRITION STATUS   (None)   Continent  (WNL)  AMBULATORY STATUS COMMUNICATION OF NEEDS Skin   Independent (Patient to be removed from fall precaution.)   Normal                       Personal Care Assistance Level of Assistance          Total Care Assistance: Independent   Functional Limitations Info             SPECIAL CARE FACTORS FREQUENCY   (None)                    Contractures Contractures Info: Not present    Additional Factors Info  Allergies   Allergies Info: Penicillins           Current Medications (12/10/2020):  This is the current hospital active medication list Current Facility-Administered Medications  Medication Dose Route Frequency Provider Last Rate Last Admin  .  acetaminophen (TYLENOL) tablet 650 mg  650 mg Oral Q6H PRN Clapacs, 12/12/2020, MD   650 mg at 12/09/20 2123  . alum & mag hydroxide-simeth (MAALOX/MYLANTA) 200-200-20 MG/5ML suspension 30 mL  30 mL Oral Q4H PRN Clapacs, [name], MD      . amLODipine (NORVASC) tablet 5 mg  5 mg Oral Daily 2124, MD   5 mg at 12/10/20 0801  . atorvastatin (LIPITOR) tablet 20 mg  20 mg Oral Daily Clapacs, 12/12/20, MD   20 mg at 12/10/20 0801  . benztropine (COGENTIN) tablet 1 mg  1 mg Oral BID Clapacs, 12/12/20, MD   1 mg at 12/10/20 0801  . clonazePAM (KLONOPIN) tablet 1 mg  1 mg Oral BID Clapacs, 12/12/20, MD   1 mg at 12/10/20 0801  . divalproex (DEPAKOTE) DR tablet 500 mg  500 mg Oral QHS 12/12/20, MD   500 mg at 12/09/20 2122  . divalproex (DEPAKOTE) DR tablet 500 mg  500 mg Oral q morning 2123, MD   500 mg at 12/10/20 12/12/20  . haloperidol (HALDOL) tablet 10 mg  10 mg Oral Daily [id number], MD   10 mg at 12/10/20 0801  . haloperidol (HALDOL) tablet 20 mg  20 mg Oral QHS 12/12/20, MD   20 mg at 12/09/20  2123  . hydrOXYzine (ATARAX/VISTARIL) tablet 50 mg  50 mg Oral TID PRN Clapacs, [name], MD   50 mg at 12/05/20 2137  . losartan (COZAAR) tablet 100 mg  100 mg Oral Daily Clapacs, [name], MD   100 mg at 12/10/20 0801  . magnesium hydroxide (MILK OF MAGNESIA) suspension 30 mL  30 mL Oral Daily PRN Clapacs, [name], MD      . nicotine (NICODERM CQ - dosed in mg/24 hours) patch 21 mg  21 mg Transdermal Daily Clapacs, [name], MD   21 mg at 12/10/20 0803     Discharge Medications: Please see discharge summary for a list of discharge medications.  Relevant Imaging Results:  Relevant Lab Results:   Additional Information    Jesse Sans, MD

## 2020-12-11 NOTE — Progress Notes (Signed)
[facility] MD Progress Note  12/11/2020 12:43 PM Norma Ignasiak  MRN:  [id number]  Principal Problem: Schizophrenia, undifferentiated (HCC) Diagnosis: Principal Problem:   Schizophrenia, undifferentiated (HCC) Active Problems:   Chronic hepatitis C (HCC)   Essential hypertension  Total Time spent with patient: 20 minutes  CC: "I'm okay"  Mr. Bekker is a  65y.o. male patient with Schizophrenia, who presents to the [facility] unit for treatment of psychosis in the context of severe agitation and dangerous behavior.    Interval History Patient was seen today for re-evaluation. No acute events overnight, ADLs improved.  Patient has been medication compliant.  Subjective: Patient seen one-on-one today. He denies SI/HI/AH/VH. He was able to play basketball this morning. Denies medication side effects. Looking forward to returning home to his group home.   Past Psychiatric History: see H&P   Past Medical History:  Past Medical History:  Diagnosis Date  . Hepatitis   . Hypertension     Past Surgical History:  Procedure Laterality Date  . ESOPHAGOGASTRODUODENOSCOPY (EGD) WITH PROPOFOL N/A 12/21/2019   Procedure: ESOPHAGOGASTRODUODENOSCOPY (EGD) WITH PROPOFOL;  Surgeon: Toney Reil, MD;  Location: [facility] ENDOSCOPY;  Service: Gastroenterology;  Laterality: N/A;   Family History: History reviewed. No pertinent family history. Family Psychiatric  History: see H&P  Social History:  Social History   Substance and Sexual Activity  Alcohol Use Yes  . Alcohol/week: 1.0 standard drink  . Types: 1 Standard drinks or equivalent per week     Social History   Substance and Sexual Activity  Drug Use Not Currently    Social History   Socioeconomic History  . Marital status: Single    Spouse name: Not on file  . Number of children: Not on file  . Years of education: Not on file  . Highest education level: Not on file  Occupational History  . Not on file  Tobacco Use  . Smoking status:  Current Every Day Smoker    Packs/day: 0.50    Years: 46.00    Pack years: 23.00    Types: Cigarettes  . Smokeless tobacco: Never Used  Vaping Use  . Vaping Use: Never used  Substance and Sexual Activity  . Alcohol use: Yes    Alcohol/week: 1.0 standard drink    Types: 1 Standard drinks or equivalent per week  . Drug use: Not Currently  . Sexual activity: Not on file  Other Topics Concern  . Not on file  Social History Narrative  . Not on file   Social Determinants of Health   Financial Resource Strain: Not on file  Food Insecurity: Not on file  Transportation Needs: Not on file  Physical Activity: Not on file  Stress: Not on file  Social Connections: Not on file   Additional Social History:    Sleep: Fair  Appetite:  Fair  Current Medications: Current Facility-Administered Medications  Medication Dose Route Frequency Provider Last Rate Last Admin  . acetaminophen (TYLENOL) tablet 650 mg  650 mg Oral Q6H PRN Clapacs, [name], MD   650 mg at 12/09/20 2123  . alum & mag hydroxide-simeth (MAALOX/MYLANTA) 200-200-20 MG/5ML suspension 30 mL  30 mL Oral Q4H PRN Clapacs, [name], MD      . amLODipine (NORVASC) tablet 5 mg  5 mg Oral Daily Jesse Sans, MD   5 mg at 12/11/20 0755  . atorvastatin (LIPITOR) tablet 20 mg  20 mg Oral Daily Clapacs, [name], MD   20 mg at 12/11/20 0755  .  benztropine (COGENTIN) tablet 1 mg  1 mg Oral BID Clapacs, Jackquline DenmarkJohn T, MD   1 mg at 12/11/20 0755  . clonazePAM (KLONOPIN) tablet 1 mg  1 mg Oral BID Clapacs, Jackquline DenmarkJohn T, MD   1 mg at 12/11/20 0755  . divalproex (DEPAKOTE) DR tablet 500 mg  500 mg Oral QHS Thalia PartyPaliy, Alisa, MD   500 mg at 12/10/20 2124  . divalproex (DEPAKOTE) DR tablet 500 mg  500 mg Oral q morning Jesse SansFreeman, Jaylen Claude M, MD   500 mg at 12/11/20 1024  . haloperidol (HALDOL) tablet 10 mg  10 mg Oral Daily Jesse SansFreeman, Rebecca Motta M, MD   10 mg at 12/11/20 0755  . haloperidol (HALDOL) tablet 20 mg  20 mg Oral QHS Jesse SansFreeman, Hasson Gaspard M, MD   20 mg at 12/10/20 2124   . hydrOXYzine (ATARAX/VISTARIL) tablet 50 mg  50 mg Oral TID PRN Audery Amellapacs, [name], MD   50 mg at 12/05/20 2137  . losartan (COZAAR) tablet 100 mg  100 mg Oral Daily Clapacs, Jackquline DenmarkJohn T, MD   100 mg at 12/11/20 0755  . magnesium hydroxide (MILK OF MAGNESIA) suspension 30 mL  30 mL Oral Daily PRN Clapacs, [name], MD      . nicotine (NICODERM CQ - dosed in mg/24 hours) patch 21 mg  21 mg Transdermal Daily Clapacs, Jackquline DenmarkJohn T, MD   21 mg at 12/11/20 [id number]    Lab Results:  Results for orders placed or performed during the hospital encounter of 11/26/20 (from the past 48 hour(s))  Resp Panel by RT-PCR (Flu A&B, Covid)     Status: None   Collection Time: 12/10/20  3:00 PM  Result Value Ref Range   SARS Coronavirus 2 by RT PCR NEGATIVE NEGATIVE    Comment: (NOTE) SARS-CoV-2 target nucleic acids are NOT DETECTED.  The SARS-CoV-2 RNA is generally detectable in upper respiratory specimens during the acute phase of infection. The lowest concentration of SARS-CoV-2 viral copies this assay can detect is 138 copies/mL. A negative result does not preclude SARS-Cov-2 infection and should not be used as the sole basis for treatment or other patient management decisions. A negative result may occur with  improper specimen collection/handling, submission of specimen other than nasopharyngeal swab, presence of viral mutation(s) within the areas targeted by this assay, and inadequate number of viral copies(<138 copies/mL). A negative result must be combined with clinical observations, patient history, and epidemiological information. The expected result is Negative.  Fact Sheet for Patients:  BloggerCourse.comhttps://www.fda.gov/media/152166/download  Fact Sheet for Healthcare Providers:  SeriousBroker.ithttps://www.fda.gov/media/152162/download  This test is no t yet approved or cleared by the [place] FDA and  has been authorized for detection and/or diagnosis of SARS-CoV-2 by FDA under an Emergency Use Authorization (EUA). This EUA  will remain  in effect (meaning this test can be used) for the duration of the COVID-19 declaration under Section 564(b)(1) of the Act, 21 U.S.C.section 360bbb-3(b)(1), unless the authorization is terminated  or revoked sooner.       Influenza A by PCR NEGATIVE NEGATIVE   Influenza B by PCR NEGATIVE NEGATIVE    Comment: (NOTE) The Xpert Xpress SARS-CoV-2/FLU/RSV plus assay is intended as an aid in the diagnosis of influenza from Nasopharyngeal swab specimens and should not be used as a sole basis for treatment. Nasal washings and aspirates are unacceptable for Xpert Xpress SARS-CoV-2/FLU/RSV testing.  Fact Sheet for Patients: BloggerCourse.comhttps://www.fda.gov/media/152166/download  Fact Sheet for Healthcare Providers: SeriousBroker.ithttps://www.fda.gov/media/152162/download  This test is not yet approved or cleared by the [place] FDA and has been authorized for detection and/or diagnosis of SARS-CoV-2 by FDA under an Emergency Use Authorization (EUA). This EUA will remain in effect (meaning this test can be used) for the duration of the COVID-19 declaration under Section 564(b)(1) of the Act, 21 U.S.C. section 360bbb-3(b)(1), unless the authorization is terminated or revoked.  Performed at [facility], [address] [place], [place] [address]     Blood Alcohol level:  Lab Results  Component Value Date   [facility] <10 11/25/2020   ETH <10 09/16/2018    Metabolic Disorder Labs: Lab Results  Component Value Date   HGBA1C 5.4 11/27/2020   MPG 108.28 11/27/2020   No results found for: PROLACTIN Lab Results  Component Value Date   CHOL 140 11/27/2020   TRIG 30 11/27/2020   HDL 61 11/27/2020   CHOLHDL 2.3 11/27/2020   VLDL 6 11/27/2020   LDLCALC 73 11/27/2020    Physical Findings: AIMS: Facial and Oral Movements Muscles of Facial Expression: Mild Lips and Perioral Area: Minimal Jaw: Minimal Tongue: Mild,Extremity Movements Upper (arms, wrists, hands, fingers): None,  normal Lower (legs, knees, ankles, toes): None, normal, Trunk Movements Neck, shoulders, hips: None, normal, Overall Severity Severity of abnormal movements (highest score from questions above): Minimal Incapacitation due to abnormal movements: None, normal Patient's awareness of abnormal movements (rate only patient's report): No Awareness, Dental Status Current problems with teeth and/or dentures?: No Does patient usually wear dentures?: No  CIWA:    COWS:     Musculoskeletal: Strength & Muscle Tone: within normal limits Gait & Station: normal Patient leans: N/A  Psychiatric Specialty Exam:  Presentation  General Appearance: Casual Eye Contact:Good Speech:Normal rate Speech Volume:Normal Handedness:Right   Mood and Affect  Mood: "okay" Affect: Euthymic  Thought Process  Thought Processes:More linear today Descriptions of Associations:Intact Orientation:Partial  Thought Content:Logical History of Schizophrenia/Schizoaffective disorder:Yes  Duration of Psychotic Symptoms:Greater than six months  Hallucinations: Denies Ideas of Reference:None Suicidal Thoughts: denies Homicidal Thoughts: denies  Sensorium  Memory:Immediate Poor; Recent Poor; Remote Poor  Judgment:Intact Insight:Present  Executive Functions  Concentration:Fair Attention Span:Fair Recall:Poor  Fund of Knowledge:Fair Language:Fair  Psychomotor Activity  Psychomotor Activity: Normal  Assets  Assets:Resilience; Desire for Improvement   Sleep  Sleep: Fair, 7 hours   Physical Exam: Physical Exam  ROS  Blood pressure 121/68, pulse 79, temperature 98.4 F (36.9 C), resp. rate 16, height 6\' 1"  (1.854 m), weight 68.9 kg, SpO2 99 %. Body mass index is 20.05 kg/m.   Treatment Plan Summary: Daily contact with patient to assess and evaluate symptoms and progress in treatment and Medication management  Patient is a 66 year old male with the above-stated past psychiatric history who is  seen in follow-up.  Chart reviewed. Patient discussed with nursing. Overall patient showing signs of improvement.    Plan:  -continue inpatient psych admission; 15-minute checks; daily contact with patient to assess and evaluate symptoms and progress in treatment; psychoeducation.   -continue scheduled medications: Continue Haldol 10mg  in the morning, 20 mg in the evening  Haldol Decanoate 150 mg IM injection given 11/15/20 prior to admission Continue Depakote 500 mg BID, valproic acid level 59 on 5/13 Continue Clonazepam 1mg  PO BID for anxiety Continue Benztropine 1mg  PO BID for EPS propx.  Continue Amlodipine 5 mg daily for HTN   . amLODipine  5 mg Oral Daily  . atorvastatin  20 mg Oral Daily  . benztropine  1 mg Oral BID  . clonazePAM  1 mg Oral BID  .  divalproex  500 mg Oral QHS  . divalproex  500 mg Oral q morning  . haloperidol  10 mg Oral Daily  . haloperidol  20 mg Oral QHS  . losartan  100 mg Oral Daily  . nicotine  21 mg Transdermal Daily     -continue PRN medications.  acetaminophen, alum & mag hydroxide-simeth, hydrOXYzine, magnesium hydroxide  -Consults: No new consults placed since yesterday    -Disposition: Tentative discharge back to group home in the morning   -  I certify that the patient does need, on a daily basis, active treatment furnished directly by or requiring the supervision of inpatient psychiatric facility personnel.   12/11/20: Psychiatric exam above reviewed and remains accurate. Assessment and plan above reviewed and updated.     Jesse Sans, MD 12/11/2020, 12:43 PM

## 2020-12-11 NOTE — Plan of Care (Signed)
  Problem: Group Participation Goal: STG - Patient will engage in groups without prompting or encouragement from LRT x3 group sessions within 5 recreation therapy group sessions Description: STG - Patient will engage in groups without prompting or encouragement from LRT x3 group sessions within 5 recreation therapy group sessions Outcome: Progressing   Problem: Disruptive/Impulsive Goal: STG - Patient will focus on task/topic with 2 prompts from staff within 5 recreation therapy group sessions Description: STG - Patient will focus on task/topic with 2 prompts from staff within 5 recreation therapy group sessions Outcome: Progressing

## 2020-12-11 NOTE — Plan of Care (Signed)
  Problem: Education: Goal: Will be free of psychotic symptoms Outcome: Progressing  Patient alert and oriented x 4 appears less psychotic.

## 2020-12-11 NOTE — [facility] Group Notes (Signed)
LCSW Group Therapy Note  12/11/2020 1:45 PM  Type of Therapy/Topic:  Group Therapy:  Feelings about Diagnosis  Participation Level:  Active   Description of Group:   This group will allow patients to explore their thoughts and feelings about diagnoses they have received. Patients will be guided to explore their level of understanding and acceptance of these diagnoses. Facilitator will encourage patients to process their thoughts and feelings about the reactions of others to their diagnosis and will guide patients in identifying ways to discuss their diagnosis with significant others in their lives. This group will be process-oriented, with patients participating in exploration of their own experiences, giving and receiving support, and processing challenge from other group members.   Therapeutic Goals: 1. Patient will demonstrate understanding of diagnosis as evidenced by identifying two or more symptoms of the disorder 2. Patient will be able to express two feelings regarding the diagnosis 3. Patient will demonstrate their ability to communicate their needs through discussion and/or role play  Summary of Patient Progress: Patient was present for the entirety of group. He was verbal but not on topic.   Therapeutic Modalities:   Cognitive Behavioral Therapy Brief Therapy Feelings Identification   Frankee Gritz R. Algis Greenhouse, MSW, LCSW, LCAS 12/11/2020 1:45 PM

## 2020-12-11 NOTE — Progress Notes (Signed)
D: Pt alert and oriented. Pt denies experiencing any anxiety/depression at this time. Pt denies experiencing any pain at this time. Pt denies experiencing any SI/HI, or AVH at this time, however can be observed responding to internal stimuli such as having conversations with no one present.  Pt was irritable and sluggish this morning. Pt appeared less sluggish this afternoon, however his irritability comes and goes. Pt is cooperative in taking his medication   Pt attending group today as well as went outside.  A: Scheduled medications administered to pt, per MD orders. Support and encouragement provided. Frequent verbal contact made. Routine safety checks conducted q15 minutes.   R: No adverse drug reactions noted. Pt verbally contracts for safety at this time. Pt complaint with medications and treatment plan. Pt interacts well with others on the unit. Pt remains safe at this time. Will continue to monitor.

## 2020-12-11 NOTE — Progress Notes (Signed)
Recreation Therapy Notes  Date: 12/11/2020  Time: 9:30 am   Location: Courtyard   Behavioral response: Appropriate   Intervention Topic: Leisure   Discussion/Intervention:  Group content today was focused on leisure. The group defined what leisure is and some positive leisure activities they participate in. Individuals identified the difference between good and bad leisure. Participants expressed how they feel after participating in the leisure of their choice. The group discussed how they go about picking a leisure activity and if others are involved in their leisure activities. The patient stated how many leisure activities they have to choose from and reasons why it is important to have leisure time. Individuals participated in the intervention "Exploration of Leisure" where they had a chance to identify new leisure activities as well as benefits of leisure. Clinical Observations/Feedback: Patient came to group and identified basketball as a leisure he enjoys. Individual was social with peers and staff while participating in intervention. Kishia Shackett LRT/CTRS         Alonzo Owczarzak 12/11/2020 10:55 AM

## 2020-12-12 NOTE — Plan of Care (Signed)
  Problem: Group Participation Goal: STG - Patient will engage in groups without prompting or encouragement from LRT x3 group sessions within 5 recreation therapy group sessions Description: STG - Patient will engage in groups without prompting or encouragement from LRT x3 group sessions within 5 recreation therapy group sessions Outcome: Completed/Met   Problem: Disruptive/Impulsive Goal: STG - Patient will focus on task/topic with 2 prompts from staff within 5 recreation therapy group sessions Description: STG - Patient will focus on task/topic with 2 prompts from staff within 5 recreation therapy group sessions Outcome: Completed/Met

## 2020-12-12 NOTE — Discharge Summary (Signed)
Physician Discharge Summary Note  Patient:  Henry Black is an 65 y.o., male MRN:  [id number] DOB:  [birth year] Patient phone:  [phone] (home)  Patient address:   [address] [place] [id number],  Total Time spent with patient: 35 minutes- 25 minutes face-to-face contact with patient, 10 minutes documentation, coordination of care, scripts   Date of Admission:  11/26/2020 Date of Discharge: 12/12/2020  Reason for Admission:  Patient seen and chart reviewed.  Patient was sent in from his group home with reports of dramatic worsening of his behavior.  Reports of agitation and dangerous behavior such as climbing up on roofs  Principal Problem: Schizophrenia, undifferentiated (HCC) Discharge Diagnoses: Principal Problem:   Schizophrenia, undifferentiated (HCC) Active Problems:   Chronic hepatitis C (HCC)   Essential hypertension   Past Psychiatric History: Patient appears to have chronic schizophrenia.  He has signs of tardive dyskinesia and notes from old visits for medical needs that he was suffering from schizophrenia and treated with Haldol at that time.  Probable prior hospitalizations.  It was reported that he has a history of at least occasional use of cocaine and other drugs but the drug screen when it was ultimately obtained was negative  Past Medical History:  Past Medical History:  Diagnosis Date  . Hepatitis   . Hypertension     Past Surgical History:  Procedure Laterality Date  . ESOPHAGOGASTRODUODENOSCOPY (EGD) WITH PROPOFOL N/A 12/21/2019   Procedure: ESOPHAGOGASTRODUODENOSCOPY (EGD) WITH PROPOFOL;  Surgeon: Toney ReilVanga, Rohini Reddy, MD;  [facility];  Service: Gastroenterology;  Laterality: N/A;   Family History: History reviewed. No pertinent family history. Family Psychiatric  History: Unknown Social History:  Social History   Substance and Sexual Activity  Alcohol Use Yes  . Alcohol/week: 1.0 standard drink  . Types: 1 Standard drinks or  equivalent per week     Social History   Substance and Sexual Activity  Drug Use Not Currently    Social History   Socioeconomic History  . Marital status: Single    Spouse name: Not on file  . Number of children: Not on file  . Years of education: Not on file  . Highest education level: Not on file  Occupational History  . Not on file  Tobacco Use  . Smoking status: Current Every Day Smoker    Packs/day: 0.50    Years: 46.00    Pack years: 23.00    Types: Cigarettes  . Smokeless tobacco: Never Used  Vaping Use  . Vaping Use: Never used  Substance and Sexual Activity  . Alcohol use: Yes    Alcohol/week: 1.0 standard drink    Types: 1 Standard drinks or equivalent per week  . Drug use: Not Currently  . Sexual activity: Not on file  Other Topics Concern  . Not on file  Social History Narrative  . Not on file   Social Determinants of Health   Financial Resource Strain: Not on file  Food Insecurity: Not on file  Transportation Needs: Not on file  Physical Activity: Not on file  Stress: Not on file  Social Connections: Not on file    Hospital Course:  Patient was sent in from his group home with reports of dramatic worsening of his behavior.  Reports of agitation and dangerous behavior such as climbing up on roofs. There was speculation of drug use, however, UDS negative on admission. It is possible that patient ingested something synthetic that does not show up on UDS. He was started on  Depakote and titrated to 500 mg BID, valproic acid level 59 on 12/07/20. His Haldol was increased to 10 mg in the morning, and 20 mg at night. He had received Haldol Decanoate 150 mg IM on 11/15/20 prior to admission. All other medications were continued as below. Patient showed slow, but steady, improvement during hospital stay. He is no longer responding to internal stimuli, and speech is clearer. He denies any suicidal ideations, homicidal ideations, visual hallucinations, or auditory  hallucinations. He has outpatient follow-up scheduled for discharge. Scripts sent to pharmacy per group home request.   Physical Findings: AIMS: Facial and Oral Movements Muscles of Facial Expression: Mild Lips and Perioral Area: Minimal Jaw: Minimal Tongue: Mild,Extremity Movements Upper (arms, wrists, hands, fingers): None, normal Lower (legs, knees, ankles, toes): None, normal, Trunk Movements Neck, shoulders, hips: None, normal, Overall Severity Severity of abnormal movements (highest score from questions above): Minimal Incapacitation due to abnormal movements: None, normal Patient's awareness of abnormal movements (rate only patient's report): No Awareness, Dental Status Current problems with teeth and/or dentures?: No Does patient usually wear dentures?: No  CIWA:    COWS:     Musculoskeletal: Strength & Muscle Tone: within normal limits Gait & Station: normal Patient leans: N/A                Psychiatric Specialty Exam:  Presentation  General Appearance: Casual  Eye Contact:Good  Speech:Normal Rate  Speech Volume:Normal  Handedness:Right   Mood and Affect  Mood:Euthymic  Affect:Congruent   Thought Process  Thought Processes:Goal Directed  Descriptions of Associations:Intact  Orientation:Full (Time, Place and Person)  Thought Content:Logical  History of Schizophrenia/Schizoaffective disorder:Yes  Duration of Psychotic Symptoms:Greater than six months  Hallucinations:Hallucinations: None  Ideas of Reference:None  Suicidal Thoughts:Suicidal Thoughts: No  Homicidal Thoughts:Homicidal Thoughts: No   Sensorium  Memory:Immediate Fair; Recent Fair; Remote Poor  Judgment:Intact  Insight:Present   Executive Functions  Concentration:Fair  Attention Span:Fair  Recall:Fair  Fund of Knowledge:Fair  Language:Fair   Psychomotor Activity  Psychomotor Activity:Psychomotor Activity: Normal   Assets  Assets:Communication  Skills; Desire for Improvement; Financial Resources/Insurance; Housing; Leisure Time; Resilience; Social Support   Sleep  Sleep:Sleep: Fair Number of Hours of Sleep: 7    Physical Exam: Physical Exam Vitals and nursing note reviewed.  Constitutional:      Appearance: Normal appearance.  HENT:     Head: Normocephalic and atraumatic.     Right Ear: External ear normal.     Left Ear: External ear normal.     Nose: Nose normal.     Mouth/Throat:     Mouth: Mucous membranes are moist.     Pharynx: Oropharynx is clear.  Eyes:     Extraocular Movements: Extraocular movements intact.     Conjunctiva/sclera: Conjunctivae normal.     Pupils: Pupils are equal, round, and reactive to light.  Cardiovascular:     Rate and Rhythm: Normal rate.     Pulses: Normal pulses.  Pulmonary:     Effort: Pulmonary effort is normal.     Breath sounds: Normal breath sounds.  Abdominal:     General: Abdomen is flat.     Palpations: Abdomen is soft.  Musculoskeletal:        General: No swelling. Normal range of motion.     Cervical back: Normal range of motion and neck supple.  Skin:    General: Skin is warm and dry.  Neurological:     General: No focal deficit present.     Mental Status:  He is alert and oriented to person, place, and time.     Comments: Tardive dyskinesia present, and most noticeable in the perioral area  Psychiatric:        Mood and Affect: Mood normal.        Behavior: Behavior normal.        Thought Content: Thought content normal.        Judgment: Judgment normal.    Review of Systems  Constitutional: Negative.   HENT: Negative.   Eyes: Negative.   Respiratory: Negative.   Cardiovascular: Negative.   Gastrointestinal: Negative.   Genitourinary: Negative.   Musculoskeletal: Negative.   Skin: Negative.   Neurological: Negative.   Endo/Heme/Allergies: Positive for environmental allergies. Does not bruise/bleed easily.  Psychiatric/Behavioral: Negative for  depression, hallucinations, substance abuse and suicidal ideas. The patient is not nervous/anxious and does not have insomnia.    Blood pressure 130/78, pulse 85, temperature 98.2 F (36.8 C), temperature source Oral, resp. rate 16, height 6\' 1"  (1.854 m), weight 68.9 kg, SpO2 99 %. Body mass index is 20.05 kg/m.   Have you used any form of tobacco in the last 30 days? (Cigarettes, Smokeless Tobacco, Cigars, and/or Pipes): Yes  Has this patient used any form of tobacco in the last 30 days? (Cigarettes, Smokeless Tobacco, Cigars, and/or Pipes)  Yes, A prescription for an FDA-approved tobacco cessation medication was offered at discharge and the patient refused  Blood Alcohol level:  Lab Results  Component Value Date   [facility] <10 11/25/2020   ETH <10 09/16/2018    Metabolic Disorder Labs:  Lab Results  Component Value Date   HGBA1C 5.4 11/27/2020   MPG 108.28 11/27/2020   No results found for: PROLACTIN Lab Results  Component Value Date   CHOL 140 11/27/2020   TRIG 30 11/27/2020   HDL 61 11/27/2020   CHOLHDL 2.3 11/27/2020   VLDL 6 11/27/2020   LDLCALC 73 11/27/2020    See Psychiatric Specialty Exam and Suicide Risk Assessment completed by Attending Physician prior to discharge.  Discharge destination:  Other:  group home  Is patient on multiple antipsychotic therapies at discharge:  No   Has Patient had three or more failed trials of antipsychotic monotherapy by history:  No  Recommended Plan for Multiple Antipsychotic Therapies: NA  Discharge Instructions    Diet general   Complete by: As directed    Increase activity slowly   Complete by: As directed      Allergies as of 12/12/2020      Reactions   Penicillins Rash   Has patient had a PCN reaction causing immediate rash, facial/tongue/throat swelling, SOB or lightheadedness with hypotension: Unknown Has patient had a PCN reaction causing severe rash involving mucus membranes or skin necrosis: Unknown Has patient  had a PCN reaction that required hospitalization: Unknown Has patient had a PCN reaction occurring within the last 10 years: Unknown If all of the above answers are "NO", then may proceed with Cephalosporin use.      Medication List    STOP taking these medications   Lucemyra 0.18 MG Tabs Generic drug: Lofexidine HCl     TAKE these medications     Indication  amLODipine 5 MG tablet Commonly known as: NORVASC Take 1 tablet (5 mg total) by mouth daily.  Indication: High Blood Pressure Disorder   atorvastatin 20 MG tablet Commonly known as: LIPITOR Take 20 mg by mouth daily.  Indication: High Amount of Fats in the Blood   benztropine 2  MG tablet Commonly known as: COGENTIN Take 2 mg by mouth 2 (two) times daily.  Indication: Extrapyramidal Reaction caused by Medications   Cholecalciferol 50 MCG (2000 UT) Caps Take 2,000 Units by mouth daily.  Indication: Vitamin D Deficiency   clonazePAM 1 MG tablet Commonly known as: KLONOPIN Take 1 mg by mouth 2 (two) times daily.  Indication: Feeling Anxious   divalproex 500 MG DR tablet Commonly known as: DEPAKOTE Take 1 tablet (500 mg total) by mouth 2 (two) times daily.  Indication: Schizophrenia   haloperidol 10 MG tablet Commonly known as: HALDOL Take 1 tablet (10 mg total) by mouth in the morning AND 2 tablets (20 mg total) at bedtime.  Indication: Psychosis   haloperidol decanoate 100 MG/ML injection Commonly known as: HALDOL DECANOATE Inject 150 mg into the muscle every 28 (twenty-eight) days.  Indication: Schizophrenia   losartan 100 MG tablet Commonly known as: COZAAR Take 100 mg by mouth daily.  Indication: High Blood Pressure Disorder   Sofosbuvir-Velpatasvir 400-100 MG Tabs Commonly known as: Epclusa Take 1 tablet by mouth daily.  Indication: chronic hep c       Follow-up Information    [facility] Follow up on 12/13/2020.   Why: You have a hosptial follow up appointment scheduled for Thursay  May 19th at 12pm noon. Appointment will be by phone with Lovina Reach. Thanks! Contact information: [address] [place] [address] [phone]               Follow-up recommendations:  Activity:  as tolerated Diet:  regular diet  Comments:  30-day scripts with 1 refill sent to [facility] per group home request. FL2 completed prior to discharge.   Signed: Jesse Sans, MD 12/12/2020, 9:07 AM

## 2020-12-12 NOTE — Progress Notes (Signed)
Recreation Therapy Notes  INPATIENT RECREATION TR PLAN  Patient Details Name: Henry Black MRN: [id number] DOB: 26-Jun-1956 Today's Date: 12/12/2020  Rec Therapy Plan Is patient appropriate for Therapeutic Recreation?: Yes Treatment times per week: At least 3 Estimated Length of Stay: 5-7 days TR Treatment/Interventions: Group participation (Comment)  Discharge Criteria Pt will be discharged from therapy if:: Discharged Treatment plan/goals/alternatives discussed and agreed upon by:: Patient/family  Discharge Summary Short term goals set: Patient will focus on task/topic with 2 prompts from staff within 5 recreation therapy group sessions Short term goals met: Complete Progress toward goals comments: Groups attended Which groups?: Stress management,Social skills,Leisure education,AAA/T,Other (Comment) (Relaxation) Reason goals not met: N/A Therapeutic equipment acquired: N/A Reason patient discharged from therapy: Discharge from hospital Pt/family agrees with progress & goals achieved: Yes Date patient discharged from therapy: 12/12/20   Tysheka Fanguy 12/12/2020, 12:13 PM

## 2020-12-12 NOTE — Tx Team (Signed)
Interdisciplinary Treatment and Diagnostic Plan Update  12/12/2020 Time of Session: 8:30AM Henry Black MRN: [id number]  Principal Diagnosis: Schizophrenia, undifferentiated (HCC)  Secondary Diagnoses: Principal Problem:   Schizophrenia, undifferentiated (HCC) Active Problems:   Chronic hepatitis C (HCC)   Essential hypertension   Current Medications:  Current Facility-Administered Medications  Medication Dose Route Frequency Provider Last Rate Last Admin  . acetaminophen (TYLENOL) tablet 650 mg  650 mg Oral Q6H PRN Clapacs, [name], MD   650 mg at 12/09/20 2123  . alum & mag hydroxide-simeth (MAALOX/MYLANTA) 200-200-20 MG/5ML suspension 30 mL  30 mL Oral Q4H PRN Clapacs, [name], MD      . amLODipine (NORVASC) tablet 5 mg  5 mg Oral Daily Jesse Sans, MD   5 mg at 12/12/20 0743  . atorvastatin (LIPITOR) tablet 20 mg  20 mg Oral Daily Clapacs, [name], MD   20 mg at 12/12/20 0743  . benztropine (COGENTIN) tablet 1 mg  1 mg Oral BID Clapacs, [name], MD   1 mg at 12/12/20 0743  . clonazePAM (KLONOPIN) tablet 1 mg  1 mg Oral BID Clapacs, [name], MD   1 mg at 12/12/20 0743  . divalproex (DEPAKOTE) DR tablet 500 mg  500 mg Oral QHS Thalia Party, MD   500 mg at 12/11/20 2140  . divalproex (DEPAKOTE) DR tablet 500 mg  500 mg Oral q morning Jesse Sans, MD   500 mg at 12/11/20 1024  . haloperidol (HALDOL) tablet 10 mg  10 mg Oral Daily Jesse Sans, MD   10 mg at 12/12/20 0743  . haloperidol (HALDOL) tablet 20 mg  20 mg Oral QHS Jesse Sans, MD   20 mg at 12/11/20 2139  . hydrOXYzine (ATARAX/VISTARIL) tablet 50 mg  50 mg Oral TID PRN Clapacs, [name], MD   50 mg at 12/11/20 2140  . losartan (COZAAR) tablet 100 mg  100 mg Oral Daily Clapacs, [name], MD   100 mg at 12/12/20 0742  . magnesium hydroxide (MILK OF MAGNESIA) suspension 30 mL  30 mL Oral Daily PRN Clapacs, [name], MD      . nicotine (NICODERM CQ - dosed in mg/24 hours) patch 21 mg  21 mg Transdermal Daily Clapacs, [name], MD   21 mg at 12/12/20 0746   PTA Medications: Medications Prior to Admission  Medication Sig Dispense Refill Last Dose  . atorvastatin (LIPITOR) 20 MG tablet Take 20 mg by mouth daily.     . benztropine (COGENTIN) 2 MG tablet Take 2 mg by mouth 2 (two) times daily.     . Cholecalciferol 2000 units CAPS Take 2,000 Units by mouth daily.     . clonazePAM (KLONOPIN) 1 MG tablet Take 1 mg by mouth 2 (two) times daily.     . haloperidol decanoate (HALDOL DECANOATE) 100 MG/ML injection Inject 150 mg into the muscle every 28 (twenty-eight) days.     Marland Kitchen losartan (COZAAR) 100 MG tablet Take 100 mg by mouth daily.     . LUCEMYRA 0.18 MG TABS      . Sofosbuvir-Velpatasvir (EPCLUSA) 400-100 MG TABS Take 1 tablet by mouth daily. 28 tablet 2     Henry Black Stressors: Educational concerns Medication change or noncompliance  Henry Black Strengths: Physical Health Supportive family/friends  Treatment Modalities: Medication Management, Group therapy, Case management,  1 to 1 session with clinician, Psychoeducation, Recreational therapy.   Physician Treatment Plan for Primary Diagnosis: Schizophrenia, undifferentiated (HCC) Long Term Goal(s): Improvement  in symptoms so as ready for discharge Improvement in symptoms so as ready for discharge   Short Term Goals: Ability to verbalize feelings will improve Ability to demonstrate self-control will improve Ability to identify and develop effective coping behaviors will improve Ability to maintain clinical measurements within normal limits will improve Compliance with prescribed medications will improve  Medication Management: Evaluate Henry Black's response, side effects, and tolerance of medication regimen.  Therapeutic Interventions: 1 to 1 sessions, Unit Group sessions and Medication administration.  Evaluation of Outcomes: Adequate for Discharge  Physician Treatment Plan for Secondary Diagnosis: Principal Problem:   Schizophrenia, undifferentiated  (HCC) Active Problems:   Chronic hepatitis C (HCC)   Essential hypertension  Long Term Goal(s): Improvement in symptoms so as ready for discharge Improvement in symptoms so as ready for discharge   Short Term Goals: Ability to verbalize feelings will improve Ability to demonstrate self-control will improve Ability to identify and develop effective coping behaviors will improve Ability to maintain clinical measurements within normal limits will improve Compliance with prescribed medications will improve     Medication Management: Evaluate Henry Black's response, side effects, and tolerance of medication regimen.  Therapeutic Interventions: 1 to 1 sessions, Unit Group sessions and Medication administration.  Evaluation of Outcomes: Adequate for Discharge   RN Treatment Plan for Primary Diagnosis: Schizophrenia, undifferentiated (HCC) Long Term Goal(s): Knowledge of disease and therapeutic regimen to maintain health will improve  Short Term Goals: Ability to remain free from injury will improve, Ability to verbalize frustration and anger appropriately will improve, Ability to demonstrate self-control, Ability to participate in decision making will improve, Ability to verbalize feelings will improve, Ability to identify and develop effective coping behaviors will improve and Compliance with prescribed medications will improve  Medication Management: RN will administer medications as ordered by provider, will assess and evaluate Henry Black's response and provide education to Henry Black for prescribed medication. RN will report any adverse and/or side effects to prescribing provider.  Therapeutic Interventions: 1 on 1 counseling sessions, Psychoeducation, Medication administration, Evaluate responses to treatment, Monitor vital signs and CBGs as ordered, Perform/monitor CIWA, COWS, AIMS and Fall Risk screenings as ordered, Perform wound care treatments as ordered.  Evaluation of Outcomes: Adequate for  Discharge   LCSW Treatment Plan for Primary Diagnosis: Schizophrenia, undifferentiated (HCC) Long Term Goal(s): Safe transition to appropriate next level of care at discharge, Engage Henry Black in therapeutic group addressing interpersonal concerns.  Short Term Goals: Engage Henry Black in aftercare planning with referrals and resources, Increase social support, Increase ability to appropriately verbalize feelings, Increase emotional regulation, Facilitate acceptance of mental health diagnosis and concerns, Facilitate Henry Black progression through stages of change regarding substance use diagnoses and concerns, Identify triggers associated with mental health/substance abuse issues and Increase skills for wellness and recovery  Therapeutic Interventions: Assess for all discharge needs, 1 to 1 time with Social worker, Explore available resources and support systems, Assess for adequacy in community support network, Educate family and significant other(s) on suicide prevention, Complete Psychosocial Assessment, Interpersonal group therapy.  Evaluation of Outcomes: Adequate for Discharge   Progress in Treatment: Attending groups: Yes. Participating in groups: Yes. Taking medication as prescribed: Yes. Toleration medication: Yes. Family/Significant other contact made: Yes, individual(s) contacted:  Drucilla Schmidt, [facility] staff member Henry Black understands diagnosis: No. Discussing Henry Black identified problems/goals with staff: No. Medical problems stabilized or resolved: Yes. Denies suicidal/homicidal ideation: Yes. Issues/concerns per Henry Black self-inventory: No. Other: None   New problem(s) identified: No, Describe:  None   New Short Term/Long  Term Goal(s): elimination of symptoms of psychosis, medication management for mood stabilization; elimination of SI thoughts; development of comprehensive mental wellness/sobriety plan. Update 12/03/20: No changes at this time.  Update 12/08/20: No  changes at this time  Update 12/12/20: No changes at this time.   Henry Black Goals:  "To get these wet clothes off." Henry Black was reported to have been incontinent this morning but had been doing better. Staff to remind Henry Black to utilize the bathroom. Update 12/03/20: Henry Black continues to have some incontinence. Henry Black was agitated this morning and given medication to help him calm down. Update 12/08/20: No changes at this time Update 12/13/20: Henry Black had fewer incidents of incontinence and was able to take himself to the restroom without staff reminders. No other changes at this time.       Discharge Plan or Barriers: CSW will assist with development of an appropriate discharge plan. Update 12/03/20: CSW confirmed that Henry Black can return to his care home facility once Henry Black is at baseline.Update 12/08/20: No changes at this time  Update 12/12/20: Henry Black has reached baseline and will be discharged to his group home today.   Reason for Continuation of Hospitalization: Medication stabilization  Estimated Length of Stay:TBD  Attendees: Henry Black: 12/12/2020 9:18 AM  Physician: Les Pou, MD  12/12/2020 9:18 AM  Nursing:  12/12/2020 9:18 AM  RN Care Manager: 12/12/2020 9:18 AM  Social Worker: Taiten Brawn [place], MSW, LCSW-A  12/12/2020 9:18 AM  Recreational Therapist:  12/12/2020 9:18 AM  Other: Jillyn Hidden, MSW, LCSW, LCAS 12/12/2020 9:18 AM  Other:  12/12/2020 9:18 AM  Other: 12/12/2020 9:18 AM    Scribe for Treatment Team: Asmi Fugere A [place], LCSWA 12/12/2020 9:18 AM

## 2020-12-12 NOTE — Progress Notes (Signed)
  [facility] Adult Case Management Discharge Plan :  Will you be returning to the same living situation after discharge:  Yes,  patient's group home At discharge, do you have transportation home?: Yes,  group home member will pick up patient  Do you have the ability to pay for your medications: Yes,  insured by Medicare  Release of information consent forms completed and in the chart;  Patient's signature needed at discharge.  Patient to Follow up at:  Follow-up Information    [facility] Follow up on 12/13/2020.   Why: You have a hosptial follow up appointment scheduled for Thursay May 19th at 12pm noon. Appointment will be by phone with Lovina Reach. Thanks! Contact information: [address] [place] [address] [phone]               Next level of care provider has access to [facility] Link:no  Safety Planning and Suicide Prevention discussed: Yes,  completed with group home member  Have you used any form of tobacco in the last 30 days? (Cigarettes, Smokeless Tobacco, Cigars, and/or Pipes): Yes  Has patient been referred to the Quitline?: Patient refused referral  Patient has been referred for addiction treatment: Pt. refused referral  Lyvonne Cassell A [place], LCSWA 12/12/2020, 8:58 AM

## 2020-12-12 NOTE — [facility] Suicide Risk Assessment (Signed)
[facility]icide [facility]   Principal Problem: Schizophrenia, undifferentiated (HCC) Discharge Diagnoses: Principal Problem:   Schizophrenia, undifferentiated (HCC) Active Problems:   Chronic hepatitis C (HCC)   Essential hypertension   Total Time spent with patient: 35 minutes- 25 minutes face-to-face contact with patient, 10 minutes documentation, coordination of care, scripts   Musculoskeletal: Strength & Muscle Tone: within normal limits Gait & Station: normal Patient leans: N/A  Psychiatric Specialty Exam  Presentation  General Appearance: Casual  Eye Contact:Good  Speech:Normal Rate  Speech Volume:Normal  Handedness:Right   Mood and Affect  Mood:Euthymic  Duration of Depression Symptoms: No data recorded Affect:Congruent   Thought Process  Thought Processes:Goal Directed  Descriptions of Associations:Intact  Orientation:Full (Time, Place and Person)  Thought Content:Logical  History of Schizophrenia/Schizoaffective disorder:Yes  Duration of Psychotic Symptoms:Greater than six months  Hallucinations:Hallucinations: None  Ideas of Reference:None  Suicidal Thoughts:Suicidal Thoughts: No  Homicidal Thoughts:Homicidal Thoughts: No   Sensorium  Memory:Immediate Fair; Recent Fair; Remote Poor  Judgment:Intact  Insight:Present   Executive Functions  Concentration:Fair  Attention Span:Fair  Recall:Fair  Fund of Knowledge:Fair  Language:Fair   Psychomotor Activity  Psychomotor Activity:Psychomotor Activity: Normal   Assets  Assets:Communication Skills; Desire for Improvement; Financial Resources/Insurance; Housing; Leisure Time; Resilience; Social Support   Sleep  Sleep:Sleep: Fair Number of Hours of Sleep: 7   Physical Exam: Physical Exam ROS Blood pressure 130/78, pulse 85, temperature 98.2 F (36.8 C), temperature source Oral, resp. rate 16, height 6\' 1"  (1.854 m), weight 68.9 kg, SpO2 99 %. Body mass index  is 20.05 kg/m.  Mental Status Per Nursing Assessment::   On Admission:  Belief that plan would result in death  Demographic Factors:  Male  Loss Factors: NA  Historical Factors: Impulsivity  Risk Reduction Factors:   Religious beliefs about death, Living with another person, especially a relative, Positive social support, Positive therapeutic relationship and Positive coping skills or problem solving skills  Continued Clinical Symptoms:  Schizophrenia:   Paranoid or undifferentiated type Previous Psychiatric Diagnoses and Treatments  Cognitive Features That Contribute To Risk:  None    Suicide Risk:  Minimal: No identifiable suicidal ideation.  Patients presenting with no risk factors but with morbid ruminations; may be classified as minimal risk based on the severity of the depressive symptoms   Follow-up Information    [facility] Follow up on 12/13/2020.   Why: You have a hosptial follow up appointment scheduled for Thursay May 19th at 12pm noon. Appointment will be by phone with May 21. Thanks! Contact information: [address] [place] [phone]               Plan Of Care/Follow-up recommendations:  Activity:  as tolerated Diet:  regular diet  [phone], MD 12/12/2020, 9:06 AM

## 2020-12-12 NOTE — Progress Notes (Signed)
Recreation Therapy Notes    Date: 12/12/2020  Time: 9:30 am   Location: Courtyard   Behavioral response: Appropriate   Intervention Topic: Social-Skills    Discussion/Intervention:  Group content on today was focused on social skills. The group defined social skills and identified ways they use social skills. Patients expressed what obstacles they face when trying to be social. Participants described the importance of social skills. The group listed ways to improve social skills and reasons to improve social skills. Individuals had an opportunity to learn new and improve social skills as well as identify their weaknesses. Clinical Observations/Feedback: Patient came to group was appropriate and focused. Individual was social with peers and staff while participating in intervention.  Qadir Folks LRT/CTRS             Dyshawn Cangelosi 12/12/2020 12:11 PM

## 2020-12-12 NOTE — Progress Notes (Signed)
Patient alert and oriented x 3, affect is blunted, thoughts are disorganized, he denies SI/HI/AVH he was noted innervating with peers and staff, no distress noted, 15 minutes safety checks maintained will continue to monitor.  

## 2020-12-12 NOTE — Progress Notes (Signed)
Pt was educated on prescriptions and follow up care. Pt questions were answered and pt verbalized understanding and did not voice any concerns. Pt's belongings were returned. He denies SI, HI, and AVH. Patient is not observed to be in any distress at time of discharge. [facility].

## 2020-12-12 NOTE — Care Management Important Message (Signed)
Important Message  Patient Details  Name: Henry Black MRN: [id number] Date of Birth: 1956-05-09   Medicare Important Message Given:  Yes     Elisama Thissen A [place], Theresia Majors 12/12/2020, 8:43 AM

## 2021-01-01 ENCOUNTER — Other Ambulatory Visit: Payer: Self-pay

## 2021-01-01 ENCOUNTER — Encounter: Payer: Self-pay | Admitting: Gastroenterology

## 2021-01-01 ENCOUNTER — Ambulatory Visit (INDEPENDENT_AMBULATORY_CARE_PROVIDER_SITE_OTHER): Payer: Medicare Other | Admitting: Gastroenterology

## 2021-01-01 VITALS — BP 121/77 | HR 76 | Temp 98.3°F | Ht 73.0 in | Wt 176.5 lb

## 2021-01-01 DIAGNOSIS — K7469 Other cirrhosis of liver: Secondary | ICD-10-CM

## 2021-01-01 DIAGNOSIS — B1920 Unspecified viral hepatitis C without hepatic coma: Secondary | ICD-10-CM | POA: Diagnosis not present

## 2021-01-01 NOTE — Patient Instructions (Addendum)
Your RUQ ultrasound is scheduled for 01/30/2021 at 9:15am at out patient imaging. Nothing to eat or drink after midnight. Address is [address], [place], [place] [address]. Phone number is [phone]

## 2021-01-01 NOTE — Progress Notes (Signed)
Arlyss Repress, MD [address]  [address]  [place], [place] [address]  Main: [phone]  Fax: [phone]    Gastroenterology Consultation  Referring Provider:   Behavioral medicine department  Primary Care Physician:  Sherrie Mustache, MD Primary Gastroenterologist:  Dr. Arlyss Repress Reason for Consultation: Unintentional weight loss, chronic hepatitis C, cirrhosis   HPI:   Henry Black is a 65 y.o. black male referred to discuss about screening colonoscopy by [facility] department. He lives in a group home, accompanied by a caregiver today. Patient reports having constipation, does not take stool softener. He reports having had a colonoscopy several years ago and does not recall the findings. He otherwise denies abdominal pain, rectal bleeding, nausea, vomiting. He does smoke cigarettes. His CBC and CMP are unremarkable.  Follow-up visit 10/15/2019 Patient is referred to me by Dr. Dario Guardian to evaluate for elevated AST.  He had mildly elevated AST 48 in 08/2018, repeat was 47 in 02/2019.  Therefore he is referred to GI for further evaluation.  Patient reports drinking 1 or 2 beers during weekends only.  He denies any symptoms of chronic liver disease.  He does not have any GI symptoms today.  He denies IV drug abuse, illicit drug use, blood transfusion, multiple sexual partners in the past.  Unknown hepatitis B and C status and she has been previously counseled  Follow-up visit 03/29/20 Patient has been approved for the treatment of chronic hep C.  However, he did not answer the call from [facility] for confirmation of medication delivery.  He denies any GI symptoms today. His hepatitis A and B serologies are negative.  He underwent upper endoscopy with no evidence of esophageal varices.  Follow-up visit 11/21/2020 Patient is referred for unintentional weight loss.Patient lost about 16 pounds since 9/21 visit.  Patient presented to clinic today with his caregiver.   Patient is altered, appeared intoxicated with alcohol and he admits to drinking alcohol yesterday.  He has been drinking alcohol regularly.  He finished hepatitis C treatment from October through December, 12 weeks of treatment.  He denies any abdominal pain, nausea or vomiting, swelling of legs, abdominal distention.  He does smoke cigarettes daily.  Patient denies any diarrhea or constipation.  He admits to smoking since age of 89, used to smoke 2 packs/day, currently smoking 1 pack/day  Follow-up visit 01/01/2021 Patient is here for follow-up of cirrhosis.  He has gained significant amount of weight back since last visit since he stopped drinking alcohol.  He is appetite has significantly improved.  He is not altered like last visit.  He continues to smoke.  He did not undergo CT abdomen and pelvis as recommended during last visit.  He does not have any GI concerns today  NSAIDs: None recent  Antiplts/Anticoagulants/Anti thrombotics: None  GI Procedures: Colonoscopy 02/02/2018 - The entire examined colon is normal. - The distal rectum and anal verge are normal on retroflexion view. - No specimens collected.  Upper endoscopy 12/21/2019 - Normal duodenal bulb and second portion of the duodenum. - Normal stomach. - Normal gastroesophageal junction and esophagus. - No specimens collected.  He denies any GI surgeries He denies family history of GI malignancy  Past Medical History:  Diagnosis Date  . Hepatitis   . Hypertension     Current Outpatient Medications:  .  amLODipine (NORVASC) 5 MG tablet, Take 1 tablet (5 mg total) by mouth daily., Disp: 30 tablet, Rfl: 1 .  atorvastatin (LIPITOR) 20 MG  tablet, Take 20 mg by mouth daily., Disp: , Rfl:  .  benztropine (COGENTIN) 2 MG tablet, Take 2 mg by mouth 2 (two) times daily., Disp: , Rfl:  .  Cholecalciferol 2000 units CAPS, Take 2,000 Units by mouth daily., Disp: , Rfl:  .  clonazePAM (KLONOPIN) 1 MG tablet, Take 1 mg by mouth 2 (two) times  daily., Disp: , Rfl:  .  divalproex (DEPAKOTE) 500 MG DR tablet, Take 1 tablet (500 mg total) by mouth 2 (two) times daily., Disp: 60 tablet, Rfl: 1 .  haloperidol (HALDOL) 10 MG tablet, Take 1 tablet (10 mg total) by mouth in the morning AND 2 tablets (20 mg total) at bedtime., Disp: 90 tablet, Rfl: 1 .  haloperidol decanoate (HALDOL DECANOATE) 100 MG/ML injection, Inject 150 mg into the muscle every 28 (twenty-eight) days., Disp: , Rfl:  .  losartan (COZAAR) 100 MG tablet, Take 100 mg by mouth daily., Disp: , Rfl:     History reviewed. No pertinent family history.   Social History   Tobacco Use  . Smoking status: Current Every Day Smoker    Packs/day: 0.50    Years: 46.00    Pack years: 23.00    Types: Cigarettes  . Smokeless tobacco: Never Used  Vaping Use  . Vaping Use: Never used  Substance Use Topics  . Alcohol use: Not Currently    Alcohol/week: 1.0 standard drink    Types: 1 Standard drinks or equivalent per week  . Drug use: Not Currently    Allergies as of 01/01/2021 - Review Complete 01/01/2021  Allergen Reaction Noted  . Penicillins Rash 05/15/2017    Review of Systems:    All systems reviewed and negative except where noted in HPI.   Physical Exam:  BP 121/77 (BP Location: Left Arm, Patient Position: Sitting, Cuff Size: Normal)   Pulse 76   Temp 98.3 F (36.8 C) (Oral)   Ht 6\' 1"  (1.854 m)   Wt 176 lb 8 oz (80.1 kg)   BMI 23.29 kg/m  No LMP for male patient.  General: Lethargic, altered, thin built, poorly nourished Head:  Normocephalic and atraumatic, bitemporal wasting. Eyes:  Sclera clear, no icterus.   Conjunctiva pink. Ears:  Normal auditory acuity. Nose:  No deformity, discharge, or lesions. Mouth:  No deformity or lesions,oropharynx pink & moist. Neck:  Supple; no masses or thyromegaly. Lungs:  Respirations even and unlabored.  Clear throughout to auscultation.   No wheezes, crackles, or rhonchi. No acute distress. Heart:  Regular rate and  rhythm; no murmurs, clicks, rubs, or gallops. Abdomen:  Normal bowel sounds. Soft, scaphoid, non-tender and non-distended without masses, hepatosplenomegaly or hernias noted.  No guarding or rebound tenderness.   Rectal: Not performed Msk:  Symmetrical without gross deformities. Good, equal movement & strength bilaterally. Pulses:  Normal pulses noted. Extremities:  No clubbing or edema.  No cyanosis. Neurologic:  Alert and oriented x3;  grossly normal neurologically. Skin:  Intact without significant lesions or rashes. No jaundice. Psych: Altered  Imaging Studies: None  Assessment and Plan:   Henry Black is a 65 y.o. 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   Chronic hep C, compensated cirrhosis: S/p SVR LFTs normal No evidence of portal hypertension, EGD with no evidence of varices or portal hypertensive gastropathy No evidence of liver lesions, normal serum AFP levels.  Recommend repeat right upper quadrant ultrasound for HCC screening Recommend Twinrix vaccine,  administered during next visit  Altered mental status secondary to alcohol intoxication: Currently resolved Patient has currently stopped drinking alcohol  Unintentional weight loss: Regained weight Will hold off on cross-sectional imaging at this time   Follow up in 6 months  Arlyss Repress, MD

## 2021-01-30 ENCOUNTER — Ambulatory Visit: Payer: Medicare Other | Attending: Gastroenterology

## 2021-03-05 ENCOUNTER — Inpatient Hospital Stay: Payer: Medicare Other

## 2021-03-05 ENCOUNTER — Inpatient Hospital Stay
Admission: EM | Admit: 2021-03-05 | Discharge: 2021-03-08 | DRG: 177 | Disposition: A | Discharge status: Home / Self Care | Payer: Medicare Other | Attending: Internal Medicine | Admitting: Internal Medicine

## 2021-03-05 ENCOUNTER — Inpatient Hospital Stay: Payer: Medicare Other | Admitting: Oncology

## 2021-03-05 ENCOUNTER — Other Ambulatory Visit: Payer: Self-pay

## 2021-03-05 ENCOUNTER — Emergency Department: Payer: Medicare Other

## 2021-03-05 DIAGNOSIS — J690 Pneumonitis due to inhalation of food and vomit: Secondary | ICD-10-CM | POA: Diagnosis not present

## 2021-03-05 DIAGNOSIS — Z1389 Encounter for screening for other disorder: Secondary | ICD-10-CM

## 2021-03-05 DIAGNOSIS — E785 Hyperlipidemia, unspecified: Secondary | ICD-10-CM | POA: Diagnosis present

## 2021-03-05 DIAGNOSIS — F17210 Nicotine dependence, cigarettes, uncomplicated: Secondary | ICD-10-CM | POA: Diagnosis present

## 2021-03-05 DIAGNOSIS — R4182 Altered mental status, unspecified: Secondary | ICD-10-CM | POA: Diagnosis not present

## 2021-03-05 DIAGNOSIS — F1410 Cocaine abuse, uncomplicated: Secondary | ICD-10-CM

## 2021-03-05 DIAGNOSIS — R400 Somnolence: Secondary | ICD-10-CM | POA: Diagnosis present

## 2021-03-05 DIAGNOSIS — I10 Essential (primary) hypertension: Secondary | ICD-10-CM | POA: Diagnosis present

## 2021-03-05 DIAGNOSIS — F203 Undifferentiated schizophrenia: Secondary | ICD-10-CM | POA: Diagnosis present

## 2021-03-05 DIAGNOSIS — G9341 Metabolic encephalopathy: Secondary | ICD-10-CM | POA: Diagnosis present

## 2021-03-05 DIAGNOSIS — I729 Aneurysm of unspecified site: Secondary | ICD-10-CM

## 2021-03-05 DIAGNOSIS — I671 Cerebral aneurysm, nonruptured: Secondary | ICD-10-CM | POA: Diagnosis present

## 2021-03-05 DIAGNOSIS — K7460 Unspecified cirrhosis of liver: Secondary | ICD-10-CM | POA: Diagnosis present

## 2021-03-05 DIAGNOSIS — J9601 Acute respiratory failure with hypoxia: Secondary | ICD-10-CM | POA: Diagnosis present

## 2021-03-05 DIAGNOSIS — D649 Anemia, unspecified: Secondary | ICD-10-CM | POA: Diagnosis present

## 2021-03-05 DIAGNOSIS — T428X5A Adverse effect of antiparkinsonism drugs and other central muscle-tone depressants, initial encounter: Secondary | ICD-10-CM | POA: Diagnosis present

## 2021-03-05 DIAGNOSIS — R0902 Hypoxemia: Secondary | ICD-10-CM

## 2021-03-05 DIAGNOSIS — Z20822 Contact with and (suspected) exposure to covid-19: Secondary | ICD-10-CM | POA: Diagnosis present

## 2021-03-05 DIAGNOSIS — T424X5A Adverse effect of benzodiazepines, initial encounter: Secondary | ICD-10-CM | POA: Diagnosis present

## 2021-03-05 DIAGNOSIS — N179 Acute kidney failure, unspecified: Secondary | ICD-10-CM | POA: Diagnosis present

## 2021-03-05 DIAGNOSIS — Z79899 Other long term (current) drug therapy: Secondary | ICD-10-CM

## 2021-03-05 DIAGNOSIS — Z880 Allergy status to penicillin: Secondary | ICD-10-CM

## 2021-03-05 LAB — CBC WITH DIFFERENTIAL/PLATELET
Abs Immature Granulocytes: 0.02 10*3/uL (ref 0.00–0.07)
Basophils Absolute: 0 10*3/uL (ref 0.0–0.1)
Basophils Relative: 0 %
Eosinophils Absolute: 0.4 10*3/uL (ref 0.0–0.5)
Eosinophils Relative: 5 %
HCT: 32.6 % — ABNORMAL LOW (ref 39.0–52.0)
Hemoglobin: 10.9 g/dL — ABNORMAL LOW (ref 13.0–17.0)
Immature Granulocytes: 0 %
Lymphocytes Relative: 26 %
Lymphs Abs: 2 10*3/uL (ref 0.7–4.0)
MCH: 30.4 pg (ref 26.0–34.0)
MCHC: 33.4 g/dL (ref 30.0–36.0)
MCV: 91.1 fL (ref 80.0–100.0)
Monocytes Absolute: 1.1 10*3/uL — ABNORMAL HIGH (ref 0.1–1.0)
Monocytes Relative: 14 %
Neutro Abs: 4.2 10*3/uL (ref 1.7–7.7)
Neutrophils Relative %: 55 %
Platelets: 121 10*3/uL — ABNORMAL LOW (ref 150–400)
RBC: 3.58 MIL/uL — ABNORMAL LOW (ref 4.22–5.81)
RDW: 14 % (ref 11.5–15.5)
WBC: 7.8 10*3/uL (ref 4.0–10.5)
nRBC: 0 % (ref 0.0–0.2)

## 2021-03-05 LAB — VALPROIC ACID LEVEL: Valproic Acid Lvl: 57 ug/mL (ref 50.0–100.0)

## 2021-03-05 LAB — URINE DRUG SCREEN, QUALITATIVE (ARMC ONLY)
Amphetamines, Ur Screen: NOT DETECTED
Barbiturates, Ur Screen: NOT DETECTED
Benzodiazepine, Ur Scrn: NOT DETECTED
Cannabinoid 50 Ng, Ur ~~LOC~~: NOT DETECTED
Cocaine Metabolite,Ur ~~LOC~~: POSITIVE — AB
MDMA (Ecstasy)Ur Screen: NOT DETECTED
Methadone Scn, Ur: NOT DETECTED
Opiate, Ur Screen: NOT DETECTED
Phencyclidine (PCP) Ur S: NOT DETECTED
Tricyclic, Ur Screen: NOT DETECTED

## 2021-03-05 LAB — COMPREHENSIVE METABOLIC PANEL
ALT: 16 U/L (ref 0–44)
AST: 22 U/L (ref 15–41)
Albumin: 3.3 g/dL — ABNORMAL LOW (ref 3.5–5.0)
Alkaline Phosphatase: 47 U/L (ref 38–126)
Anion gap: 7 (ref 5–15)
BUN: 47 mg/dL — ABNORMAL HIGH (ref 8–23)
CO2: 28 mmol/L (ref 22–32)
Calcium: 9.3 mg/dL (ref 8.9–10.3)
Chloride: 106 mmol/L (ref 98–111)
Creatinine, Ser: 1.69 mg/dL — ABNORMAL HIGH (ref 0.61–1.24)
GFR, Estimated: 45 mL/min — ABNORMAL LOW (ref >60)
Glucose, Bld: 100 mg/dL — ABNORMAL HIGH (ref 70–99)
Potassium: 4.6 mmol/L (ref 3.5–5.1)
Sodium: 141 mmol/L (ref 135–145)
Total Bilirubin: 0.8 mg/dL (ref 0.3–1.2)
Total Protein: 6.7 g/dL (ref 6.5–8.1)

## 2021-03-05 LAB — TROPONIN I (HIGH SENSITIVITY)
Troponin I (High Sensitivity): 4 ng/L (ref <18)
Troponin I (High Sensitivity): 5 ng/L (ref <18)

## 2021-03-05 LAB — RESP PANEL BY RT-PCR (FLU A&B, COVID) ARPGX2
Influenza A by PCR: NEGATIVE
Influenza B by PCR: NEGATIVE
SARS Coronavirus 2 by RT PCR: NEGATIVE

## 2021-03-05 LAB — ETHANOL: Alcohol, Ethyl (B): <10 mg/dL (ref <10)

## 2021-03-05 LAB — URINALYSIS, COMPLETE (UACMP) WITH MICROSCOPIC
Bilirubin Urine: NEGATIVE
Glucose, UA: NEGATIVE mg/dL
Hgb urine dipstick: NEGATIVE
Ketones, ur: NEGATIVE mg/dL
Leukocytes,Ua: NEGATIVE
Nitrite: NEGATIVE
Protein, ur: NEGATIVE mg/dL
Specific Gravity, Urine: 1.02 (ref 1.005–1.030)
pH: 5 (ref 5.0–8.0)

## 2021-03-05 LAB — LACTIC ACID, PLASMA: Lactic Acid, Venous: 0.9 mmol/L (ref 0.5–1.9)

## 2021-03-05 LAB — CBG MONITORING, ED: Glucose-Capillary: 94 mg/dL (ref 70–99)

## 2021-03-05 LAB — AMMONIA: Ammonia: 54 umol/L — ABNORMAL HIGH (ref 9–35)

## 2021-03-05 LAB — MAGNESIUM: Magnesium: 2.1 mg/dL (ref 1.7–2.4)

## 2021-03-05 LAB — CK: Total CK: 390 U/L (ref 49–397)

## 2021-03-05 MED ORDER — HALOPERIDOL 5 MG PO TABS
10.0000 mg | ORAL_TABLET | Freq: Every day | ORAL | Status: DC
  Administered 2021-03-06 – 2021-03-08 (×3): 10 mg via ORAL
  Filled 2021-03-05 (×2): qty 2

## 2021-03-05 MED ORDER — HALOPERIDOL LACTATE 5 MG/ML IJ SOLN
5.0000 mg | Freq: Once | INTRAMUSCULAR | Status: AC
  Administered 2021-03-05: 5 mg via INTRAMUSCULAR
  Filled 2021-03-05: qty 1

## 2021-03-05 MED ORDER — DIVALPROEX SODIUM 500 MG PO DR TAB
500.0000 mg | DELAYED_RELEASE_TABLET | Freq: Two times a day (BID) | ORAL | Status: DC
  Administered 2021-03-05 – 2021-03-08 (×7): 500 mg via ORAL
  Filled 2021-03-05 (×8): qty 1

## 2021-03-05 MED ORDER — LACTATED RINGERS IV BOLUS
1000.0000 mL | Freq: Once | INTRAVENOUS | Status: AC
  Administered 2021-03-05: 1000 mL via INTRAVENOUS

## 2021-03-05 MED ORDER — LACTATED RINGERS IV BOLUS: 500.0000 mL | Freq: Once | INTRAVENOUS | Status: DC

## 2021-03-05 MED ORDER — ATORVASTATIN CALCIUM 20 MG PO TABS
20.0000 mg | ORAL_TABLET | Freq: Every day | ORAL | Status: DC
  Administered 2021-03-05 – 2021-03-08 (×4): 20 mg via ORAL
  Filled 2021-03-05 (×4): qty 1

## 2021-03-05 MED ORDER — NALOXONE HCL 2 MG/2ML IJ SOSY
0.4000 mg | PREFILLED_SYRINGE | Freq: Once | INTRAMUSCULAR | Status: AC
  Administered 2021-03-05: 0.4 mg via INTRAVENOUS
  Filled 2021-03-05: qty 2

## 2021-03-05 MED ORDER — BENZTROPINE MESYLATE 1 MG PO TABS
2.0000 mg | ORAL_TABLET | Freq: Two times a day (BID) | ORAL | Status: DC
  Administered 2021-03-05 – 2021-03-08 (×6): 2 mg via ORAL
  Filled 2021-03-05 (×6): qty 2
  Filled 2021-03-05: qty 1
  Filled 2021-03-05: qty 2

## 2021-03-05 MED ORDER — HALOPERIDOL 5 MG PO TABS
20.0000 mg | ORAL_TABLET | Freq: Every day | ORAL | Status: DC
  Administered 2021-03-05 – 2021-03-07 (×3): 20 mg via ORAL
  Filled 2021-03-05 (×5): qty 4

## 2021-03-05 MED ORDER — CLONAZEPAM 0.5 MG PO TABS
1.0000 mg | ORAL_TABLET | Freq: Two times a day (BID) | ORAL | Status: DC
  Administered 2021-03-05 – 2021-03-07 (×4): 1 mg via ORAL
  Filled 2021-03-05 (×4): qty 2

## 2021-03-05 NOTE — ED Notes (Signed)
Pt drinks cup of ginger ale with meds

## 2021-03-05 NOTE — ED Notes (Signed)
Pt given turkey sandwich tray 

## 2021-03-05 NOTE — ED Notes (Signed)
Urine collected and sent to lab.

## 2021-03-05 NOTE — ED Notes (Signed)
Pt given chocolate ice cream, now wants to rest. Pt given warm blankets and lights turned off. Will continue to monitor.

## 2021-03-05 NOTE — ED Notes (Signed)
[facility]  SEEN BY  DR  Operating Room Services  MD

## 2021-03-05 NOTE — BH Assessment (Signed)
Comprehensive Clinical Assessment (CCA) Note  03/05/2021 Henry Black [id number]  [name], 65 year old male who presents to [facility] ED involuntarily for treatment. Per triage note, BIBA from [facility] group home. Patient AMS per staff. Patient snuck out at 0430 (last time seen normal) to get cocaine. Patient lethargic, not responding appropriately.   During TTS assessment pt presents alert and oriented x 3, restless but cooperative, and mood-congruent with affect. The pt does not appear to be responding to internal or external stimuli. Neither is the pt presenting with any delusional thinking. Pt verified the information provided to triage RN.   Pt was brought to the ED with altered mental status after reports from group home staff stating patient snuck out last night and used cocaine. Patient denies those allegations; however, his UDS is showing positive for cocaine. During the interview, patient would reply to some questions by nodding his head or softly speaking yes or no. Communication was minimal to none. Patient was awake but would drift off and close his eyes. Patient has history of schizophrenia. Pt denies SI/HI.    Per Dr. Toni Amend pt does not meet criteria for inpatient psychiatric admission but will observe overnight and reassess in the morning.   Chief Complaint:  Chief Complaint  Patient presents with   Altered Mental Status   Visit Diagnosis: Schizophrenia    CCA Screening, Triage and Referral (STR)  Patient Reported Information How did you hear about [place]? -- (Group home)  Referral name: Law Enforcement  Referral phone number: No data recorded  Whom do you see for routine medical problems? No data recorded Practice/Facility Name: No data recorded Practice/Facility Phone Number: No data recorded Name of Contact: No data recorded Contact Number: No data recorded Contact Fax Number: No data recorded Prescriber Name: No data recorded Prescriber Address (if known):  No data recorded  What Is the Reason for Your Visit/Call Today? Patient was brought in to the ED from group home with unusual behavior.  How Long Has This Been Causing You Problems? <Week  What Do You Feel Would Help You the Most Today? Alcohol or Drug Use Treatment; Medication(s) (Assessment only)   Have You Recently Been in Any Inpatient Treatment (Hospital/Detox/Crisis Center/28-Day Program)? No  Name/Location of Program/Hospital:No data recorded How Long Were You There? No data recorded When Were You Discharged? No data recorded  Have You Ever Received Services From [facility] Before? Yes  Who Do You See at [facility]? Medical Treatment   Have You Recently Had Any Thoughts About Hurting Yourself? No  Are You Planning to Commit Suicide/Harm Yourself At This time? No   Have you Recently Had Thoughts About Hurting Someone Karolee Ohs? No  Explanation: No data recorded  Have You Used Any Alcohol or Drugs in the Past 24 Hours? Yes  How Long Ago Did You Use Drugs or Alcohol? No data recorded What Did You Use and How Much? Cocaine- Unable to quantify   Do You Currently Have a Therapist/Psychiatrist? No  Name of Therapist/Psychiatrist: No data recorded  Have You Been Recently Discharged From Any Office Practice or Programs? No  Explanation of Discharge From Practice/Program: No data recorded    CCA Screening Triage Referral Assessment Type of Contact: Face-to-Face  Is this Initial or Reassessment? No data recorded Date Telepsych consult ordered in [facility]:  No data recorded Time Telepsych consult ordered in [facility]:  No data recorded  Patient Reported Information Reviewed? Yes  Patient Left Without Being Seen? No data recorded Reason for  Not Completing Assessment: No data recorded  Collateral Involvement: None provided   Does Patient Have a Court Appointed Legal Guardian? No data recorded Name and Contact of Legal Guardian: No data recorded If Minor and Not Living with  Parent(s), Who has Custody? n/a  Is CPS involved or ever been involved? Never  Is APS involved or ever been involved? Never   Patient Determined To Be At Risk for Harm To Self or Others Based on Review of Patient Reported Information or Presenting Complaint? No  Method: No data recorded Availability of Means: No data recorded Intent: No data recorded Notification Required: No data recorded Additional Information for Danger to Others Potential: No data recorded Additional Comments for Danger to Others Potential: No data recorded Are There Guns or Other Weapons in Your Home? No data recorded Types of Guns/Weapons: No data recorded Are These Weapons Safely Secured?                            No data recorded Who Could Verify You Are Able To Have These Secured: No data recorded Do You Have any Outstanding Charges, Pending Court Dates, Parole/Probation? No data recorded Contacted To Inform of Risk of Harm To Self or Others: No data recorded  Location of Assessment: [facility] ED   Does Patient Present under Involuntary Commitment? No  IVC Papers Initial File Date: 11/25/20   [place] of Residence: [place]   Patient Currently Receiving the Following Services: Medication Management   Determination of Need: Urgent (48 hours)   Options For Referral: Group Home; Medication Management; ED Visit        Recommendations for Services/Supports/Treatments:    DSM5 Diagnoses: Patient Active Problem List   Diagnosis Date Noted   Cocaine abuse (HCC) 03/05/2021   Essential hypertension 11/27/2020   Schizoaffective disorder (HCC) 11/26/2020   Schizophrenia, undifferentiated (HCC) 11/26/2020   Chronic hepatitis C (HCC) 03/29/2020   Hepatic cirrhosis (HCC) 03/29/2020   Aggressive behavior 09/17/2018    Patient Centered Plan: Patient is on the following Treatment Plan(s):  Schizophrenia   Referrals to Alternative Service(s): Referred to Alternative Service(s):   Place:   Date:    Time:    Referred to Alternative Service(s):   Place:   Date:   Time:    Referred to Alternative Service(s):   Place:   Date:   Time:    Referred to Alternative Service(s):   Place:   Date:   Time:     Inola Lisle Dierdre Searles, Counselor, LCAS-A

## 2021-03-05 NOTE — ED Notes (Signed)
Pt asleep at this time, unable to collect vitals. Will collect pt vitals once awake. 

## 2021-03-05 NOTE — ED Notes (Signed)
MD aware of patient's status.  MD at bedside assessing patient.

## 2021-03-05 NOTE — ED Notes (Signed)
Sitter at bedside.  Patient evidently, as observed by other staff, pulled IV out and attempted to leave.  MD ordered patient to be IVC'd

## 2021-03-05 NOTE — ED Notes (Signed)
Red tennis shoes One grey sock Jeans Blue tshirt Red and grey t shirt Grey boxer briefs  Pt dressed out with Gena Fray and Vernona Rieger tech Pt belongings with Museum/gallery curator in quad area

## 2021-03-05 NOTE — ED Provider Notes (Signed)
[facility] Emergency Department Provider Note  ____________________________________________   Event Date/Time   First MD Initiated Contact with Patient 03/05/21 1122     (approximate)  I have reviewed the triage vital signs and the nursing notes.   HISTORY  Chief Complaint Altered Mental Status   HPI Henry Black is a 65 y.o. male with a past medical history of gets of hernia, HTN, gets of hernia, cirrhosis and recent psych hospitalization earlier this year in May for agitation and bizarre behavior including climbing on roofs who presents EMS from a group home persistent altered mental status.  Per EMS is some concern that he may have been using some cocaine earlier today.  Over they are unable to find any additional history.  On arrival patient states he is not currently in any pain is able to state his name but is unemployed any additional history 6 altered mental status.  No other history is made available on patient presentation.         Past Medical History:  Diagnosis Date   Hepatitis    Hypertension     Patient Active Problem List   Diagnosis Date Noted   Essential hypertension 11/27/2020   Schizoaffective disorder (HCC) 11/26/2020   Schizophrenia, undifferentiated (HCC) 11/26/2020   Chronic hepatitis C (HCC) 03/29/2020   Hepatic cirrhosis (HCC) 03/29/2020   Aggressive behavior 09/17/2018    Past Surgical History:  Procedure Laterality Date   ESOPHAGOGASTRODUODENOSCOPY (EGD) WITH PROPOFOL N/A 12/21/2019   Procedure: ESOPHAGOGASTRODUODENOSCOPY (EGD) WITH PROPOFOL;  Surgeon: Toney Reil, MD;  Location: [facility] ENDOSCOPY;  Service: Gastroenterology;  Laterality: N/A;    Prior to Admission medications   Medication Sig Start Date End Date Taking? Authorizing Provider  amLODipine (NORVASC) 5 MG tablet Take 1 tablet (5 mg total) by mouth daily. 12/11/20   Jesse Sans, MD  atorvastatin (LIPITOR) 20 MG tablet Take 20 mg by mouth  daily.    [provider]  benztropine (COGENTIN) 2 MG tablet Take 2 mg by mouth 2 (two) times daily.    [provider]  Cholecalciferol 2000 units CAPS Take 2,000 Units by mouth daily.    [provider]  clonazePAM (KLONOPIN) 1 MG tablet Take 1 mg by mouth 2 (two) times daily.    [provider]  divalproex (DEPAKOTE) 500 MG DR tablet Take 1 tablet (500 mg total) by mouth 2 (two) times daily. 12/10/20   Jesse Sans, MD  haloperidol (HALDOL) 10 MG tablet Take 1 tablet (10 mg total) by mouth in the morning AND 2 tablets (20 mg total) at bedtime. 12/10/20   Jesse Sans, MD  haloperidol decanoate (HALDOL DECANOATE) 100 MG/ML injection Inject 150 mg into the muscle every 28 (twenty-eight) days.    [provider]  losartan (COZAAR) 100 MG tablet Take 100 mg by mouth daily.    [provider]    Allergies Penicillins  No family history on file.  Social History Social History   Tobacco Use   Smoking status: Every Day    Packs/day: 0.50    Years: 46.00    Pack years: 23.00    Types: Cigarettes   Smokeless tobacco: Never  Vaping Use   Vaping Use: Never used  Substance Use Topics   Alcohol use: Not Currently    Alcohol/week: 1.0 standard drink    Types: 1 Standard drinks or equivalent per week   Drug use: Not Currently    Review of Systems  Review of  Systems  Unable to perform ROS: Mental status change     ____________________________________________   PHYSICAL EXAM:  VITAL SIGNS: ED Triage Vitals  Enc Vitals Group     BP      Pulse      Resp      Temp      Temp src      SpO2      Weight      Height      Head Circumference      Peak Flow      Pain Score      Pain Loc      Pain Edu?      Excl. in GC?    Vitals:   03/05/21 1231 03/05/21 1446  BP: 97/67 102/66  Pulse: 81 88  Resp: 18 16  Temp: 98.4 F (36.9 C) 98.7 F (37.1 C)  SpO2: 96% 99%   Physical Exam Vitals and nursing note reviewed.   Constitutional:      General: He is in acute distress.     Appearance: He is well-developed. He is ill-appearing.  HENT:     Head: Normocephalic and atraumatic.     Right Ear: External ear normal.     Left Ear: External ear normal.     Mouth/Throat:     Mouth: Mucous membranes are dry.  Eyes:     Conjunctiva/sclera: Conjunctivae normal.  Cardiovascular:     Rate and Rhythm: Normal rate and regular rhythm.     Pulses: Normal pulses.     Heart sounds: No murmur heard. Pulmonary:     Effort: Pulmonary effort is normal. No respiratory distress.     Breath sounds: Normal breath sounds.  Abdominal:     Palpations: Abdomen is soft.     Tenderness: There is no abdominal tenderness.  Musculoskeletal:     Cervical back: Neck supple.  Skin:    General: Skin is warm and dry.     Capillary Refill: Capillary refill takes more than 3 seconds.  Neurological:     Mental Status: He is disoriented and confused.    PERRLA.  EOMI.  Patient is able to give examiner thumbs up with both hands.  Stable lift both heels off the bed for 5 seconds.  He withdraws all extremities to noxious stimuli.  He does not participate in pronator drift or finger dysmetria testing.  He is able to state his name and is fairly slow to answer questions but otherwise does not participate in interview. ____________________________________________   LABS (all labs ordered are listed, but only abnormal results are displayed)  Labs Reviewed  CBC WITH DIFFERENTIAL/PLATELET - Abnormal; Notable for the following components:      Result Value   RBC 3.58 (*)    Hemoglobin 10.9 (*)    HCT 32.6 (*)    Platelets 121 (*)    Monocytes Absolute 1.1 (*)    All other components within normal limits  COMPREHENSIVE METABOLIC PANEL - Abnormal; Notable for the following components:   Glucose, Bld 100 (*)    BUN 47 (*)    Creatinine, Ser 1.69 (*)    Albumin 3.3 (*)    GFR, Estimated 45 (*)    All other components within normal  limits  AMMONIA - Abnormal; Notable for the following components:   Ammonia 54 (*)    All other components within normal limits  RESP PANEL BY RT-PCR (FLU A&B, COVID) ARPGX2  MAGNESIUM  CK  ETHANOL  LACTIC ACID, PLASMA  VALPROIC ACID LEVEL  URINALYSIS, COMPLETE (UACMP) WITH MICROSCOPIC  URINE DRUG SCREEN, QUALITATIVE ([facility] ONLY)  LACTIC ACID, PLASMA  CBG MONITORING, ED  TROPONIN I (HIGH SENSITIVITY)  TROPONIN I (HIGH SENSITIVITY)   ____________________________________________  EKG  Sinus rhythm with a ventricular rate of 88, borderline nonspecific ST changes in slight elevation of ST segment in V2 without other clearance of acute ischemia or significant arrhythmia. ____________________________________________  RADIOLOGY  ED MD interpretation: CT head shows no evidence of intracranial hemorrhage, trauma, mass-effect or ventriculomegaly.  No other acute intracranial process noted.  Chest x-ray shows some atelectasis at the right base without convincing evidence of pneumonia, overt edema, effusion, pneumothorax or other clear acute intrathoracic process.  Official radiology report(s): CT HEAD WO CONTRAST (5MM)  Result Date: 03/05/2021 CLINICAL DATA:  Mental status change, unknown cause. EXAM: CT HEAD WITHOUT CONTRAST TECHNIQUE: Contiguous axial images were obtained from the base of the skull through the vertex without intravenous contrast. COMPARISON:  Prior head CT 11/29/2020. FINDINGS: Brain: Cerebral volume is unremarkable for age. There is no acute intracranial hemorrhage. No demarcated cortical infarct. No extra-axial fluid collection. No evidence of an intracranial mass. No midline shift. Vascular: No hyperdense vessel. Skull: Normal. Negative for fracture or focal lesion. Sinuses/Orbits: Visualized orbits show no acute finding. Chronic medially displaced fracture deformity of the left lamina papyracea. Mild mucosal thickening within the left greater than right ethmoid air cells.  Trace mucosal thickening and frothy secretions within the bilateral sphenoid sinuses. IMPRESSION: No evidence of acute intracranial abnormality. Mild paranasal sinus disease at the imaged levels, as described. Electronically Signed   By: Jackey LogeKyle  Golden DO   On: 03/05/2021 12:43   DG Chest Portable 1 View  Result Date: 03/05/2021 CLINICAL DATA:  Altered mental status, low blood pressure. EXAM: PORTABLE CHEST 1 VIEW COMPARISON:  None FINDINGS: Shallow inspiration radiograph. Heart size within normal limits. Mild ill-defined opacity within the left lung base, which may reflect atelectasis or aspiration/pneumonia. Minimal atelectasis within the right lung base. No evidence of pleural effusion or pneumothorax. No acute bony abnormality identified. IMPRESSION: Shallow inspiration radiograph. Mild ill-defined opacity within the left lung base, which may reflect atelectasis or sequela of aspiration/pneumonia. Minimal atelectasis within the right lung base. Electronically Signed   By: Jackey LogeKyle  Golden DO   On: 03/05/2021 12:46    ____________________________________________   PROCEDURES  Procedure(s) performed (including Critical Care):  Procedures   ____________________________________________   INITIAL IMPRESSION / ASSESSMENT AND PLAN / ED COURSE      Patient presents with above to history exam for assessment of altered mental status.  Unknown last known well.  On arrival patient is able to state his name and give a thumbs up but otherwise does not participate in interview or exam.  There is no obvious trauma.  No clear focal deficits.  Differential is quite broad and includes acute drug intoxication, sepsis, metabolic derangements, CVA, with lower suspicion for trauma at this time.  CT head shows no evidence of intracranial hemorrhage, trauma, mass-effect or ventriculomegaly.  No other acute intracranial process noted.  Chest x-ray shows some atelectasis at the right base without convincing evidence of  pneumonia, overt edema, effusion, pneumothorax or other clear acute intrathoracic process.  No clear focal deficits on arrival to suggest CVA at this time.  CMP remarkable for evidence of an AKI with a BUN of 47 and a creatinine of 1.69.  No other significant electrolyte or metabolic derangements.  CBC shows no leukocytosis hemoglobin of 10.9 compared to  12.13 months ago.  Magnesium is 2.1.  CK is unremarkable.  Ethanol is undetectable.  Ammonia slightly elevated at 54.  Lactic acid is nonelevated.  COVID influenza PCR is negative.  Given absence of fever or leukocytosis without obvious foci of infection on exam or low suspicion for acute infectious process.  Troponin x2 and ECG are not suggestive of ACS.  Depakote level is within normal limits.  Several reassessments patient seemed to have slightly improving mental status and was able to state he was thirsty and wanted drink of juice.  He also stated he wished to leave although on reassessment he is not oriented and states it is 2002 and that it is winter outside.  Given he is still altered concerned given otherwise reassuring medical work-up for possible decompensation from a schizophrenia perspective.  Given he appears slightly decompensated will IVC and consult TTS.  Also consult psychiatry.  The patient has been placed in psychiatric observation due to the need to provide a safe environment for the patient while obtaining psychiatric consultation and evaluation, as well as ongoing medical and medication management to treat the patient's condition.  The patient has been placed under full IVC at this time.      ____________________________________________   FINAL CLINICAL IMPRESSION(S) / ED DIAGNOSES  Final diagnoses:  Altered mental status, unspecified altered mental status type  AKI (acute kidney injury) (HCC)    Medications  haloperidol lactate (HALDOL) injection 5 mg (has no administration in time range)  atorvastatin (LIPITOR) tablet  20 mg (has no administration in time range)  benztropine (COGENTIN) tablet 2 mg (has no administration in time range)  divalproex (DEPAKOTE) DR tablet 500 mg (has no administration in time range)  lactated ringers bolus 1,000 mL (0 mLs Intravenous Stopped 03/05/21 1255)  lactated ringers bolus 1,000 mL (1,000 mLs Intravenous New Bag/Given 03/05/21 1255)  naloxone (NARCAN) injection 0.4 mg (0.4 mg Intravenous Given 03/05/21 1406)     ED Discharge Orders     None        Note:  This document was prepared using Dragon voice recognition software and may include unintentional dictation errors.    Gilles Chiquito, MD 03/05/21 [phone]

## 2021-03-05 NOTE — ED Notes (Signed)
Food provided for patient.  Patient appears calm and cooperative at this time. Respirations even and unlabored. No acute distress noted.

## 2021-03-05 NOTE — ED Notes (Signed)
Patient transferred from 15 to room 24H after dressing out and screening for contraband. Report received from [place], [place] including situation, background, assessment and recommendations. Pt oriented to [employer] including Q15 minute rounds as well as Psychologist, counselling for their protection. Patient is alert and oriented, warm and dry in no acute distress. Patient denies SI, HI, and AVH. Pt. Encouraged to let this nurse know if needs arise.

## 2021-03-05 NOTE — ED Notes (Signed)
Patient asleep at this time.  Resting comfortably.  No acute distress or discomfort noted.

## 2021-03-05 NOTE — ED Notes (Signed)
Patient provided food and fluids.

## 2021-03-05 NOTE — ED Triage Notes (Signed)
BIBA from [facility] group home. Patient AMS per staff. Patient snuck out at 0430 (last time seen normal) to get cocaine. Patient lethargic, not responding appropriately.

## 2021-03-05 NOTE — ED Notes (Signed)
Patient is aware of need for urine specimen collection.  Urinal provided for patient.

## 2021-03-05 NOTE — Consult Note (Signed)
[facility] Face-to-Face Psychiatry Consult   Reason for Consult: Consult for 65 year old man with a history of schizophrenia brought from his group home with change in mental state Referring Physician: Katrinka Blazing Patient Identification: Henry Black MRN:  [id number] Principal Diagnosis: Schizophrenia, undifferentiated (HCC) Diagnosis:  Principal Problem:   Schizophrenia, undifferentiated (HCC) Active Problems:   Cocaine abuse (HCC)   Total Time spent with patient: 1 hour  Subjective:   Henry Black is a 65 y.o. male patient admitted with patient not communicating.  HPI: Patient seen chart reviewed.  65 year old man resident at a group home with a history of schizophrenia.  Brought to the hospital today with reports of altered mental state.  Behavior reported to be different than usual.  Odd behavior and not communicating.  Staff from group home reported that the patient had snuck out last night and was suspected to have gone out to use cocaine.  On interview I found a patient who was awake with his eyes open but would not or could not communicate with me.  Answered a few questions with grunts or single words but was not consistent in his answer.  Claimed he had not used any drugs.  Drug screen is positive for cocaine.  Patient does not appear to be in any physical distress.  According to staff in the room he had been communicating with some of them a little more clearly earlier.  He is not reported to have been violent or aggressive or threatening any self-harm.  Past Psychiatric History: Patient has schizophrenia and lives in a group home.  Has had several prior hospitalizations most recently at our hospital earlier in the summer.  Was discharged on mood stabilizer and antipsychotic.  Risk to Self:   Risk to Others:   Prior Inpatient Therapy:   Prior Outpatient Therapy:    Past Medical History:  Past Medical History:  Diagnosis Date   Hepatitis    Hypertension     Past Surgical History:   Procedure Laterality Date   ESOPHAGOGASTRODUODENOSCOPY (EGD) WITH PROPOFOL N/A 12/21/2019   Procedure: ESOPHAGOGASTRODUODENOSCOPY (EGD) WITH PROPOFOL;  Surgeon: Toney Reil, MD;  Location: [facility] ENDOSCOPY;  Service: Gastroenterology;  Laterality: N/A;   Family History: No family history on file. Family Psychiatric  History: None reported Social History:  Social History   Substance and Sexual Activity  Alcohol Use Not Currently   Alcohol/week: 1.0 standard drink   Types: 1 Standard drinks or equivalent per week     Social History   Substance and Sexual Activity  Drug Use Not Currently    Social History   Socioeconomic History   Marital status: Single    Spouse name: Not on file   Number of children: Not on file   Years of education: Not on file   Highest education level: Not on file  Occupational History   Not on file  Tobacco Use   Smoking status: Every Day    Packs/day: 0.50    Years: 46.00    Pack years: 23.00    Types: Cigarettes   Smokeless tobacco: Never  Vaping Use   Vaping Use: Never used  Substance and Sexual Activity   Alcohol use: Not Currently    Alcohol/week: 1.0 standard drink    Types: 1 Standard drinks or equivalent per week   Drug use: Not Currently   Sexual activity: Not on file  Other Topics Concern   Not on file  Social History Narrative   Not on file   Social Determinants of  Health   Financial Resource Strain: Not on file  Food Insecurity: Not on file  Transportation Needs: Not on file  Physical Activity: Not on file  Stress: Not on file  Social Connections: Not on file   Additional Social History:    Allergies:   Allergies  Allergen Reactions   Penicillins Rash    Has patient had a PCN reaction causing immediate rash, facial/tongue/throat swelling, SOB or lightheadedness with hypotension: Unknown Has patient had a PCN reaction causing severe rash involving mucus membranes or skin necrosis: Unknown Has patient had a PCN  reaction that required hospitalization: Unknown Has patient had a PCN reaction occurring within the last 10 years: Unknown If all of the above answers are "NO", then may proceed with Cephalosporin use.     Labs:  Results for orders placed or performed during the hospital encounter of 03/05/21 (from the past 48 hour(s))  CBG monitoring, ED     Status: None   Collection Time: 03/05/21 11:17 AM  Result Value Ref Range   Glucose-Capillary 94 70 - 99 mg/dL    Comment: Glucose reference range applies only to samples taken after fasting for at least 8 hours.  CBC with Differential     Status: Abnormal   Collection Time: 03/05/21 11:35 AM  Result Value Ref Range   WBC 7.8 4.0 - 10.5 K/uL   RBC 3.58 (L) 4.22 - 5.81 MIL/uL   Hemoglobin 10.9 (L) 13.0 - 17.0 g/dL   HCT 16.1 (L) 09.6 - 04.5 %   MCV 91.1 80.0 - 100.0 fL   MCH 30.4 26.0 - 34.0 pg   MCHC 33.4 30.0 - 36.0 g/dL   RDW 40.9 81.1 - 91.4 %   Platelets 121 (L) 150 - 400 K/uL   nRBC 0.0 0.0 - 0.2 %   Neutrophils Relative % 55 %   Neutro Abs 4.2 1.7 - 7.7 K/uL   Lymphocytes Relative 26 %   Lymphs Abs 2.0 0.7 - 4.0 K/uL   Monocytes Relative 14 %   Monocytes Absolute 1.1 (H) 0.1 - 1.0 K/uL   Eosinophils Relative 5 %   Eosinophils Absolute 0.4 0.0 - 0.5 K/uL   Basophils Relative 0 %   Basophils Absolute 0.0 0.0 - 0.1 K/uL   Immature Granulocytes 0 %   Abs Immature Granulocytes 0.02 0.00 - 0.07 K/uL    Comment: Performed at [facility], [address] [place], [place] [address]  Comprehensive metabolic panel     Status: Abnormal   Collection Time: 03/05/21 11:35 AM  Result Value Ref Range   Sodium 141 135 - 145 mmol/L   Potassium 4.6 3.5 - 5.1 mmol/L   Chloride 106 98 - 111 mmol/L   CO2 28 22 - 32 mmol/L   Glucose, Bld 100 (H) 70 - 99 mg/dL    Comment: Glucose reference range applies only to samples taken after fasting for at least 8 hours.   BUN 47 (H) 8 - 23 mg/dL   Creatinine, Ser 6.21 (H) 0.61 - 1.24 mg/dL    Calcium 9.3 8.9 - 30.8 mg/dL   Total Protein 6.7 6.5 - 8.1 g/dL   Albumin 3.3 (L) 3.5 - 5.0 g/dL   AST 22 15 - 41 U/L   ALT 16 0 - 44 U/L   Alkaline Phosphatase 47 38 - 126 U/L   Total Bilirubin 0.8 0.3 - 1.2 mg/dL   GFR, Estimated 45 (L) >60 mL/min    Comment: (NOTE) Calculated using the CKD-EPI Creatinine Equation (  2021)    Anion gap 7 5 - 15    Comment: Performed at [facility], [address] [place], [place] [address]  Magnesium     Status: None   Collection Time: 03/05/21 11:35 AM  Result Value Ref Range   Magnesium 2.1 1.7 - 2.4 mg/dL    Comment: Performed at [facility], [address] [place], [place] [address]  CK     Status: None   Collection Time: 03/05/21 11:35 AM  Result Value Ref Range   Total CK 390 49 - 397 U/L    Comment: Performed at [facility], [address] [place], [place] [address]  Ethanol     Status: None   Collection Time: 03/05/21 11:35 AM  Result Value Ref Range   Alcohol, Ethyl (B) <10 <10 mg/dL    Comment: (NOTE) Lowest detectable limit for serum alcohol is 10 mg/dL.  For medical purposes only. Performed at [facility], [address] [place], [place] [address]   Urinalysis, Complete w Microscopic     Status: Abnormal   Collection Time: 03/05/21 11:35 AM  Result Value Ref Range   Color, Urine YELLOW (A) YELLOW   APPearance CLEAR (A) CLEAR   Specific Gravity, Urine 1.020 1.005 - 1.030   pH 5.0 5.0 - 8.0   Glucose, UA NEGATIVE NEGATIVE mg/dL   Hgb urine dipstick NEGATIVE NEGATIVE   Bilirubin Urine NEGATIVE NEGATIVE   Ketones, ur NEGATIVE NEGATIVE mg/dL   Protein, ur NEGATIVE NEGATIVE mg/dL   Nitrite NEGATIVE NEGATIVE   Leukocytes,Ua NEGATIVE NEGATIVE   RBC / HPF 0-5 0 - 5 RBC/hpf   WBC, UA 0-5 0 - 5 WBC/hpf   Bacteria, UA RARE (A) NONE SEEN   Squamous Epithelial / LPF 0-5 0 - 5   Mucus PRESENT    Hyaline Casts, UA PRESENT     Comment: Performed at [facility], [address] [place], [place] [address]  Ammonia     Status: Abnormal   Collection Time: 03/05/21 11:35 AM  Result Value Ref Range   Ammonia 54 (H) 9 - 35 umol/L    Comment: Performed at [facility], [address] [place], [place] [address]  Lactic acid, plasma     Status: None   Collection Time: 03/05/21 11:35 AM  Result Value Ref Range   Lactic Acid, Venous 0.9 0.5 - 1.9 mmol/L    Comment: Performed at [facility], [address] [place], [place] [address]  Resp Panel by RT-PCR (Flu A&B, Covid) Nasopharyngeal Swab     Status: None   Collection Time: 03/05/21 11:35 AM   Specimen: Nasopharyngeal Swab; Nasopharyngeal(NP) swabs in vial transport medium  Result Value Ref Range   SARS Coronavirus 2 by RT PCR NEGATIVE NEGATIVE    Comment: (NOTE) SARS-CoV-2 target nucleic acids are NOT DETECTED.  The SARS-CoV-2 RNA is generally detectable in upper respiratory specimens during the acute phase of infection. The lowest concentration of SARS-CoV-2 viral copies this assay can detect is 138 copies/mL. A negative result does not preclude SARS-Cov-2 infection and should not be used as the sole basis for treatment or other patient management decisions. A negative result may occur with  improper specimen collection/handling, submission of specimen other than nasopharyngeal swab, presence of viral mutation(s) within the areas targeted by this assay, and inadequate number of viral copies(<138 copies/mL). A negative result must be combined with clinical observations, patient history, and epidemiological information. The expected result is Negative.  Fact Sheet for Patients:  BloggerCourse.com  Fact Sheet for Healthcare Providers:  SeriousBroker.it  This test is no t yet approved or cleared by the [place] FDA and  has been authorized for detection and/or diagnosis of SARS-CoV-2 by FDA under an Emergency Use Authorization  (EUA). This EUA will remain  in effect (meaning this test can be used) for the duration of the COVID-19 declaration under Section 564(b)(1) of the Act, 21 U.S.C.section 360bbb-3(b)(1), unless the authorization is terminated  or revoked sooner.       Influenza A by PCR NEGATIVE NEGATIVE   Influenza B by PCR NEGATIVE NEGATIVE    Comment: (NOTE) The Xpert Xpress SARS-CoV-2/FLU/RSV plus assay is intended as an aid in the diagnosis of influenza from Nasopharyngeal swab specimens and should not be used as a sole basis for treatment. Nasal washings and aspirates are unacceptable for Xpert Xpress SARS-CoV-2/FLU/RSV testing.  Fact Sheet for Patients: BloggerCourse.com  Fact Sheet for Healthcare Providers: SeriousBroker.it  This test is not yet approved or cleared by the [place] FDA and has been authorized for detection and/or diagnosis of SARS-CoV-2 by FDA under an Emergency Use Authorization (EUA). This EUA will remain in effect (meaning this test can be used) for the duration of the COVID-19 declaration under Section 564(b)(1) of the Act, 21 U.S.C. section 360bbb-3(b)(1), unless the authorization is terminated or revoked.  Performed at [facility], [address], [place], [place] [address]   Urine Drug Screen, Qualitative [facility] only)     Status: Abnormal   Collection Time: 03/05/21 11:35 AM  Result Value Ref Range   Tricyclic, Ur Screen NONE DETECTED NONE DETECTED   Amphetamines, Ur Screen NONE DETECTED NONE DETECTED   MDMA (Ecstasy)Ur Screen NONE DETECTED NONE DETECTED   Cocaine Metabolite,Ur [place] POSITIVE (A) NONE DETECTED   Opiate, Ur Screen NONE DETECTED NONE DETECTED   Phencyclidine (PCP) Ur S NONE DETECTED NONE DETECTED   Cannabinoid 50 Ng, Ur [place] NONE DETECTED NONE DETECTED   Barbiturates, Ur Screen NONE DETECTED NONE DETECTED   Benzodiazepine, Ur Scrn NONE DETECTED NONE DETECTED   Methadone Scn, Ur NONE  DETECTED NONE DETECTED    Comment: (NOTE) Tricyclics + metabolites, urine    Cutoff 1000 ng/mL Amphetamines + metabolites, urine  Cutoff 1000 ng/mL MDMA (Ecstasy), urine              Cutoff 500 ng/mL Cocaine Metabolite, urine          Cutoff 300 ng/mL Opiate + metabolites, urine        Cutoff 300 ng/mL Phencyclidine (PCP), urine         Cutoff 25 ng/mL Cannabinoid, urine                 Cutoff 50 ng/mL Barbiturates + metabolites, urine  Cutoff 200 ng/mL Benzodiazepine, urine              Cutoff 200 ng/mL Methadone, urine                   Cutoff 300 ng/mL  The urine drug screen provides only a preliminary, unconfirmed analytical test result and should not be used for non-medical purposes. Clinical consideration and professional judgment should be applied to any positive drug screen result due to possible interfering substances. A more specific alternate chemical method must be used in order to obtain a confirmed analytical result. Gas chromatography / mass spectrometry (GC/MS) is the preferred confirm atory method. Performed at [facility], [address] [place], [place] [address]   Troponin I (High Sensitivity)     Status: None   Collection Time: 03/05/21 11:35 AM  Result Value Ref Range   Troponin I (High Sensitivity) 4 <18 ng/L    Comment: (NOTE) Elevated high sensitivity troponin I (hsTnI) values and significant  changes across serial measurements may suggest ACS but many other  chronic and acute conditions are known to elevate hsTnI results.  Refer to the "Links" section for chest pain algorithms and additional  guidance. Performed at [facility], [address] [place], [place] [address]   Valproic acid level     Status: None   Collection Time: 03/05/21 11:35 AM  Result Value Ref Range   Valproic Acid Lvl 57 50.0 - 100.0 ug/mL    Comment: Performed at [facility], [address] [place], [place] [address]  Troponin I (High  Sensitivity)     Status: None   Collection Time: 03/05/21  2:05 PM  Result Value Ref Range   Troponin I (High Sensitivity) 5 <18 ng/L    Comment: (NOTE) Elevated high sensitivity troponin I (hsTnI) values and significant  changes across serial measurements may suggest ACS but many other  chronic and acute conditions are known to elevate hsTnI results.  Refer to the "Links" section for chest pain algorithms and additional  guidance. Performed at [facility], [address] [place], [place] [address]     Current Facility-Administered Medications  Medication Dose Route Frequency Provider Last Rate Last Admin   atorvastatin (LIPITOR) tablet 20 mg  20 mg Oral Daily Gilles Chiquito, MD   20 mg at 03/05/21 1612   benztropine (COGENTIN) tablet 2 mg  2 mg Oral BID Gilles Chiquito, MD   2 mg at 03/05/21 1612   clonazePAM (KLONOPIN) tablet 1 mg  1 mg Oral BID Naiya Corral T, MD       divalproex (DEPAKOTE) DR tablet 500 mg  500 mg Oral BID Gilles Chiquito, MD   500 mg at 03/05/21 1612   haloperidol (HALDOL) tablet 10 mg  10 mg Oral Daily Charles Niese, [name], MD       haloperidol (HALDOL) tablet 20 mg  20 mg Oral QHS Siennah Barrasso, [name], MD       Current Outpatient Medications  Medication Sig Dispense Refill   amLODipine (NORVASC) 5 MG tablet Take 1 tablet (5 mg total) by mouth daily. 30 tablet 1   atorvastatin (LIPITOR) 20 MG tablet Take 20 mg by mouth daily.     benztropine (COGENTIN) 2 MG tablet Take 2 mg by mouth 2 (two) times daily.     Cholecalciferol 2000 units CAPS Take 2,000 Units by mouth daily.     clonazePAM (KLONOPIN) 1 MG tablet Take 1 mg by mouth 2 (two) times daily.     divalproex (DEPAKOTE) 500 MG DR tablet Take 1 tablet (500 mg total) by mouth 2 (two) times daily. 60 tablet 1   haloperidol (HALDOL) 10 MG tablet Take 1 tablet (10 mg total) by mouth in the morning AND 2 tablets (20 mg total) at bedtime. 90 tablet 1   haloperidol decanoate (HALDOL DECANOATE) 100 MG/ML  injection Inject 150 mg into the muscle every 28 (twenty-eight) days.     losartan (COZAAR) 100 MG tablet Take 100 mg by mouth daily.      Musculoskeletal: Strength & Muscle Tone: within normal limits Gait & Station: unsteady Patient leans: N/A  Psychiatric Specialty Exam:  Presentation  General Appearance: Casual  Eye Contact:Good  Speech:Normal Rate  Speech Volume:Normal  Handedness:Right   Mood and Affect  Mood:Euthymic  Affect:Congruent   Thought Process  Thought Processes:Goal Directed  Descriptions of Associations:Intact  Orientation:Full (Time, Place and Person)  Thought Content:Logical  History of Schizophrenia/Schizoaffective disorder:Yes  Duration of Psychotic Symptoms:Greater than six months  Hallucinations:No data recorded Ideas of Reference:None  Suicidal Thoughts:No data recorded Homicidal Thoughts:No data recorded  Sensorium  Memory:Immediate Fair; Recent Fair; Remote Poor  Judgment:Intact  Insight:Present   Executive Functions  Concentration:Fair  Attention Span:Fair  Recall:Fair  Fund of Knowledge:Fair  Language:Fair   Psychomotor Activity  Psychomotor Activity: No data recorded  Assets  Assets:Communication Skills; Desire for Improvement; Financial Resources/Insurance; Housing; Leisure Time; Resilience; Social Support   Sleep  Sleep: No data recorded  Physical Exam: Physical Exam Vitals and nursing note reviewed.  Constitutional:      Appearance: Normal appearance.  HENT:     Head: Normocephalic and atraumatic.     Mouth/Throat:     Pharynx: Oropharynx is clear.  Eyes:     Pupils: Pupils are equal, round, and reactive to light.  Cardiovascular:     Rate and Rhythm: Normal rate and regular rhythm.  Pulmonary:     Effort: Pulmonary effort is normal.     Breath sounds: Normal breath sounds.  Abdominal:     General: Abdomen is flat.     Palpations: Abdomen is soft.  Musculoskeletal:         General: Normal range of motion.  Skin:    General: Skin is warm and dry.  Neurological:     General: No focal deficit present.     Mental Status: He is alert. Mental status is at baseline.  Psychiatric:        Attention and Perception: He is inattentive.        Mood and Affect: Mood normal. Affect is blunt.        Speech: He is noncommunicative.        Behavior: Behavior is withdrawn.        Cognition and Memory: Cognition is impaired.   Review of Systems  Unable to perform ROS: Psychiatric disorder  Blood pressure 102/66, pulse 88, temperature 98.7 F (37.1 C), temperature source Oral, resp. rate 16, height 6\' 1"  (1.854 m), weight 80.1 kg, SpO2 99 %. Body mass index is 23.3 kg/m.  Treatment Plan Summary: Medication management and Plan patient with schizophrenia.  Currently not aggressive or violent but also not able to communicate or interact in a way that is very reassuring.  Labs reviewed nothing seems to be particularly out of place.  I have put in orders for his usual psychiatric medicines which are a pretty high dose of haloperidol as well as Depakote and clonazepam.  Patient does not appear to need inpatient psychiatric treatment and probably will be able to go back to his group home but it is hard to be certain if he is not up and communicating.  We can leave the IVC on overnight and reassess in the morning or later tonight to see how he is doing before making a decision.  Disposition: No evidence of imminent risk to self or others at present.   Patient does not meet criteria for psychiatric inpatient admission.  , MD 03/05/2021 6:07 PM

## 2021-03-06 ENCOUNTER — Emergency Department: Payer: Medicare Other

## 2021-03-06 DIAGNOSIS — F203 Undifferentiated schizophrenia: Secondary | ICD-10-CM | POA: Diagnosis not present

## 2021-03-06 LAB — COMPREHENSIVE METABOLIC PANEL
ALT: 16 U/L (ref 0–44)
AST: 19 U/L (ref 15–41)
Albumin: 3.7 g/dL (ref 3.5–5.0)
Alkaline Phosphatase: 47 U/L (ref 38–126)
Anion gap: 5 (ref 5–15)
BUN: 26 mg/dL — ABNORMAL HIGH (ref 8–23)
CO2: 32 mmol/L (ref 22–32)
Calcium: 9.6 mg/dL (ref 8.9–10.3)
Chloride: 104 mmol/L (ref 98–111)
Creatinine, Ser: 1.05 mg/dL (ref 0.61–1.24)
GFR, Estimated: >60 mL/min (ref >60)
Glucose, Bld: 94 mg/dL (ref 70–99)
Potassium: 5.1 mmol/L (ref 3.5–5.1)
Sodium: 141 mmol/L (ref 135–145)
Total Bilirubin: 0.8 mg/dL (ref 0.3–1.2)
Total Protein: 7.3 g/dL (ref 6.5–8.1)

## 2021-03-06 LAB — CBC WITH DIFFERENTIAL/PLATELET
Abs Immature Granulocytes: 0.02 10*3/uL (ref 0.00–0.07)
Basophils Absolute: 0 10*3/uL (ref 0.0–0.1)
Basophils Relative: 0 %
Eosinophils Absolute: 0.3 10*3/uL (ref 0.0–0.5)
Eosinophils Relative: 3 %
HCT: 36 % — ABNORMAL LOW (ref 39.0–52.0)
Hemoglobin: 11.8 g/dL — ABNORMAL LOW (ref 13.0–17.0)
Immature Granulocytes: 0 %
Lymphocytes Relative: 32 %
Lymphs Abs: 3 10*3/uL (ref 0.7–4.0)
MCH: 30.3 pg (ref 26.0–34.0)
MCHC: 32.8 g/dL (ref 30.0–36.0)
MCV: 92.5 fL (ref 80.0–100.0)
Monocytes Absolute: 1.2 10*3/uL — ABNORMAL HIGH (ref 0.1–1.0)
Monocytes Relative: 13 %
Neutro Abs: 4.9 10*3/uL (ref 1.7–7.7)
Neutrophils Relative %: 52 %
Platelets: 127 10*3/uL — ABNORMAL LOW (ref 150–400)
RBC: 3.89 MIL/uL — ABNORMAL LOW (ref 4.22–5.81)
RDW: 13.7 % (ref 11.5–15.5)
WBC: 9.4 10*3/uL (ref 4.0–10.5)
nRBC: 0 % (ref 0.0–0.2)

## 2021-03-06 LAB — LACTIC ACID, PLASMA: Lactic Acid, Venous: 1.1 mmol/L (ref 0.5–1.9)

## 2021-03-06 LAB — AMMONIA: Ammonia: 13 umol/L (ref 9–35)

## 2021-03-06 MED ORDER — LORAZEPAM 2 MG/ML IJ SOLN
1.0000 mg | Freq: Once | INTRAMUSCULAR | Status: AC
  Administered 2021-03-06: 1 mg via INTRAVENOUS
  Filled 2021-03-06: qty 1

## 2021-03-06 MED ORDER — HALOPERIDOL LACTATE 5 MG/ML IJ SOLN
5.0000 mg | Freq: Once | INTRAMUSCULAR | Status: AC
  Administered 2021-03-06: 5 mg via INTRAVENOUS
  Filled 2021-03-06: qty 1

## 2021-03-06 NOTE — ED Notes (Signed)
IVC PAPERS  RESCINDED  PER  DR  CLAPACS MD  INFORMED  JENNIFER  RN ?

## 2021-03-06 NOTE — [place] FL2 (Signed)
[place] MEDICAID FL2 LEVEL OF CARE SCREENING TOOL     IDENTIFICATION  Patient Name: Henry Black Birthdate: January 12, 1956 Sex: male Admission Date (Current Location): 03/05/2021  [place] and [place] Number:  [address] [id number] [address] Facility and Address:  [facility], [address], [place], [place] [address]      Provider Number: [id number]  Attending Physician Name and Address:  No att. providers found  Relative Name and Phone Number:  [name] (Other)   [phone] [facility])    Current Level of Care: Other (Comment) (Family Care Home/Group Home) Recommended Level of Care: Assisted Living Facility, Family Care Home, Other (Comment) (Group Home) Prior Approval Number:    Date Approved/Denied:   PASRR Number:    Discharge Plan: Other (Comment) ([facility])    Current Diagnoses: Patient Active Problem List   Diagnosis Date Noted   Cocaine abuse (HCC) 03/05/2021   Essential hypertension 11/27/2020   Schizoaffective disorder (HCC) 11/26/2020   Schizophrenia, undifferentiated (HCC) 11/26/2020   Chronic hepatitis C (HCC) 03/29/2020   Hepatic cirrhosis (HCC) 03/29/2020   Aggressive behavior 09/17/2018    Orientation RESPIRATION BLADDER Height & Weight     Self, Time, Situation, Place  Normal Continent Weight: 176 lb 9.4 oz (80.1 kg) Height:  6\' 1"  (185.4 cm)  BEHAVIORAL SYMPTOMS/MOOD NEUROLOGICAL BOWEL NUTRITION STATUS      Continent Diet  AMBULATORY STATUS COMMUNICATION OF NEEDS Skin   Independent Verbally Normal                       Personal Care Assistance Level of Assistance  Bathing, Feeding, Dressing, Total care Bathing Assistance: Independent Feeding assistance: Independent Dressing Assistance: Independent Total Care Assistance: Independent   Functional Limitations Info  Sight, Speech, Hearing Sight Info: Adequate Hearing Info: Adequate Speech Info: Adequate    SPECIAL CARE FACTORS FREQUENCY                        Contractures Contractures Info: Not present    Additional Factors Info                  Current Medications (03/06/2021):  This is the current hospital active medication list Current Facility-Administered Medications  Medication Dose Route Frequency Provider Last Rate Last Admin   atorvastatin (LIPITOR) tablet 20 mg  20 mg Oral Daily 05/06/2021, MD   20 mg at 03/06/21 1122   benztropine (COGENTIN) tablet 2 mg  2 mg Oral BID 05/06/21, MD   2 mg at 03/06/21 1123   clonazePAM (KLONOPIN) tablet 1 mg  1 mg Oral BID Clapacs, [name], MD   1 mg at 03/06/21 1122   divalproex (DEPAKOTE) DR tablet 500 mg  500 mg Oral BID 05/06/21, MD   500 mg at 03/06/21 1123   haloperidol (HALDOL) tablet 10 mg  10 mg Oral Daily Clapacs, 05/06/21, MD   10 mg at 03/06/21 1122   haloperidol (HALDOL) tablet 20 mg  20 mg Oral QHS Clapacs, [name], MD   20 mg at 03/05/21 2140   Current Outpatient Medications  Medication Sig Dispense Refill   amLODipine (NORVASC) 5 MG tablet Take 1 tablet (5 mg total) by mouth daily. 30 tablet 1   atorvastatin (LIPITOR) 20 MG tablet Take 20 mg by mouth daily.     benztropine (COGENTIN) 2 MG tablet Take 2 mg by mouth 2 (two) times daily.     Cholecalciferol 2000  units CAPS Take 2,000 Units by mouth daily.     clonazePAM (KLONOPIN) 1 MG tablet Take 1 mg by mouth 2 (two) times daily.     divalproex (DEPAKOTE) 500 MG DR tablet Take 1 tablet (500 mg total) by mouth 2 (two) times daily. 60 tablet 1   haloperidol (HALDOL) 10 MG tablet Take 1 tablet (10 mg total) by mouth in the morning AND 2 tablets (20 mg total) at bedtime. 90 tablet 1   haloperidol decanoate (HALDOL DECANOATE) 100 MG/ML injection Inject 150 mg into the muscle every 28 (twenty-eight) days.     losartan (COZAAR) 100 MG tablet Take 100 mg by mouth daily.       Discharge Medications: Please see discharge summary for a list of discharge medications.  Relevant Imaging  Results:  Relevant Lab Results:   Additional Information SS# [id number]  Joseph Art, LCSWA

## 2021-03-06 NOTE — ED Provider Notes (Signed)
Patient was evaluated by Dr. Toni Amend who feels that he is appropriate for discharge back to his group home.   Henry Hacking, MD 03/06/21 1116

## 2021-03-06 NOTE — Consult Note (Signed)
[facility]   Reason for Consult: Follow-up consult for 65 year old man with schizophrenia Referring Physician: Sidney AceMcHugh Patient Identification: Henry JanusDonald Mcintyre MRN:  [id number] Principal Diagnosis: Schizophrenia, undifferentiated (HCC) Diagnosis:  Principal Problem:   Schizophrenia, undifferentiated (HCC) Active Problems:   Cocaine abuse (HCC)   Total Time spent with patient: 30 minutes  Subjective:   Henry Black is a 65 y.o. male patient admitted with "I am okay".  HPI: Patient seen again today.  He came into the hospital yesterday morning with reports of change behavior at home thought to be related to a night of going out using cocaine.  He was medically stable with no behavior problems yesterday but was not communicating much.  Patient was kept overnight in the ER.  Still not talking very much but wakes up enough to tell me that he does want to go home.  He does not appear to be responding to internal stimuli and has not been aggressive or violent.  He has been cooperative with medication.  Medically stable.  Past Psychiatric History: History of chronic schizophrenia.  Lives at a group home and is on medicine regularly.  Past hospitalizations.  Intermittent substance abuse.  Risk to Self:   Risk to Others:   Prior Inpatient Therapy:   Prior Outpatient Therapy:    Past Medical History:  Past Medical History:  Diagnosis Date   Hepatitis    Hypertension     Past Surgical History:  Procedure Laterality Date   ESOPHAGOGASTRODUODENOSCOPY (EGD) WITH PROPOFOL N/A 12/21/2019   Procedure: ESOPHAGOGASTRODUODENOSCOPY (EGD) WITH PROPOFOL;  Surgeon: Toney ReilVanga, Rohini Reddy, MD;  Location: [facility] ENDOSCOPY;  Service: Gastroenterology;  Laterality: N/A;   Family History: No family history on file. Family Psychiatric  History: None reported Social History:  Social History   Substance and Sexual Activity  Alcohol Use Not Currently   Alcohol/week: 1.0 standard drink    Types: 1 Standard drinks or equivalent per week     Social History   Substance and Sexual Activity  Drug Use Not Currently    Social History   Socioeconomic History   Marital status: Single    Spouse name: Not on file   Number of children: Not on file   Years of education: Not on file   Highest education level: Not on file  Occupational History   Not on file  Tobacco Use   Smoking status: Every Day    Packs/day: 0.50    Years: 46.00    Pack years: 23.00    Types: Cigarettes   Smokeless tobacco: Never  Vaping Use   Vaping Use: Never used  Substance and Sexual Activity   Alcohol use: Not Currently    Alcohol/week: 1.0 standard drink    Types: 1 Standard drinks or equivalent per week   Drug use: Not Currently   Sexual activity: Not on file  Other Topics Concern   Not on file  Social History Narrative   Not on file   Social Determinants of Health   Financial Resource Strain: Not on file  Food Insecurity: Not on file  Transportation Needs: Not on file  Physical Activity: Not on file  Stress: Not on file  Social Connections: Not on file   Additional Social History:    Allergies:   Allergies  Allergen Reactions   Penicillins Rash    Has patient had a PCN reaction causing immediate rash, facial/tongue/throat swelling, SOB or lightheadedness with hypotension: Unknown Has patient had a PCN reaction causing severe rash involving mucus  membranes or skin necrosis: Unknown Has patient had a PCN reaction that required hospitalization: Unknown Has patient had a PCN reaction occurring within the last 10 years: Unknown If all of the above answers are "NO", then may proceed with Cephalosporin use.     Labs:  Results for orders placed or performed during the hospital encounter of 03/05/21 (from the past 48 hour(s))  CBG monitoring, ED     Status: None   Collection Time: 03/05/21 11:17 AM  Result Value Ref Range   Glucose-Capillary 94 70 - 99 mg/dL    Comment: Glucose  reference range applies only to samples taken after fasting for at least 8 hours.  CBC with Differential     Status: Abnormal   Collection Time: 03/05/21 11:35 AM  Result Value Ref Range   WBC 7.8 4.0 - 10.5 K/uL   RBC 3.58 (L) 4.22 - 5.81 MIL/uL   Hemoglobin 10.9 (L) 13.0 - 17.0 g/dL   HCT 41.6 (L) 60.6 - 30.1 %   MCV 91.1 80.0 - 100.0 fL   MCH 30.4 26.0 - 34.0 pg   MCHC 33.4 30.0 - 36.0 g/dL   RDW 60.1 09.3 - 23.5 %   Platelets 121 (L) 150 - 400 K/uL   nRBC 0.0 0.0 - 0.2 %   Neutrophils Relative % 55 %   Neutro Abs 4.2 1.7 - 7.7 K/uL   Lymphocytes Relative 26 %   Lymphs Abs 2.0 0.7 - 4.0 K/uL   Monocytes Relative 14 %   Monocytes Absolute 1.1 (H) 0.1 - 1.0 K/uL   Eosinophils Relative 5 %   Eosinophils Absolute 0.4 0.0 - 0.5 K/uL   Basophils Relative 0 %   Basophils Absolute 0.0 0.0 - 0.1 K/uL   Immature Granulocytes 0 %   Abs Immature Granulocytes 0.02 0.00 - 0.07 K/uL    Comment: Performed at [facility], [address] [place], [place] [address]  Comprehensive metabolic panel     Status: Abnormal   Collection Time: 03/05/21 11:35 AM  Result Value Ref Range   Sodium 141 135 - 145 mmol/L   Potassium 4.6 3.5 - 5.1 mmol/L   Chloride 106 98 - 111 mmol/L   CO2 28 22 - 32 mmol/L   Glucose, Bld 100 (H) 70 - 99 mg/dL    Comment: Glucose reference range applies only to samples taken after fasting for at least 8 hours.   BUN 47 (H) 8 - 23 mg/dL   Creatinine, Ser 0.25 (H) 0.61 - 1.24 mg/dL   Calcium 9.3 8.9 - 42.7 mg/dL   Total Protein 6.7 6.5 - 8.1 g/dL   Albumin 3.3 (L) 3.5 - 5.0 g/dL   AST 22 15 - 41 U/L   ALT 16 0 - 44 U/L   Alkaline Phosphatase 47 38 - 126 U/L   Total Bilirubin 0.8 0.3 - 1.2 mg/dL   GFR, Estimated 45 (L) >60 mL/min    Comment: (NOTE) Calculated using the CKD-EPI Creatinine Equation (2021)    Anion gap 7 5 - 15    Comment: Performed at [facility], [address] [place], [place] [address]  Magnesium     Status: None    Collection Time: 03/05/21 11:35 AM  Result Value Ref Range   Magnesium 2.1 1.7 - 2.4 mg/dL    Comment: Performed at [facility], [address], [place], [place] [address]  CK     Status: None   Collection Time: 03/05/21 11:35 AM  Result Value  Ref Range   Total CK 390 49 - 397 U/L    Comment: Performed at [facility], [address] [place], [place] [address]  Ethanol     Status: None   Collection Time: 03/05/21 11:35 AM  Result Value Ref Range   Alcohol, Ethyl (B) <10 <10 mg/dL    Comment: (NOTE) Lowest detectable limit for serum alcohol is 10 mg/dL.  For medical purposes only. Performed at [facility], [address] [place], [place] [address]   Urinalysis, Complete w Microscopic     Status: Abnormal   Collection Time: 03/05/21 11:35 AM  Result Value Ref Range   Color, Urine YELLOW (A) YELLOW   APPearance CLEAR (A) CLEAR   Specific Gravity, Urine 1.020 1.005 - 1.030   pH 5.0 5.0 - 8.0   Glucose, UA NEGATIVE NEGATIVE mg/dL   Hgb urine dipstick NEGATIVE NEGATIVE   Bilirubin Urine NEGATIVE NEGATIVE   Ketones, ur NEGATIVE NEGATIVE mg/dL   Protein, ur NEGATIVE NEGATIVE mg/dL   Nitrite NEGATIVE NEGATIVE   Leukocytes,Ua NEGATIVE NEGATIVE   RBC / HPF 0-5 0 - 5 RBC/hpf   WBC, UA 0-5 0 - 5 WBC/hpf   Bacteria, UA RARE (A) NONE SEEN   Squamous Epithelial / LPF 0-5 0 - 5   Mucus PRESENT    Hyaline Casts, UA PRESENT     Comment: Performed at [facility], [address] [place], [place] [address]  Ammonia     Status: Abnormal   Collection Time: 03/05/21 11:35 AM  Result Value Ref Range   Ammonia 54 (H) 9 - 35 umol/L    Comment: Performed at [facility], [address] [place], [place] [address]  Lactic acid, plasma     Status: None   Collection Time: 03/05/21 11:35 AM  Result Value Ref Range   Lactic Acid, Venous 0.9 0.5 - 1.9 mmol/L    Comment: Performed at [facility], [address], [place], [place]  [address]  Resp Panel by RT-PCR (Flu A&B, Covid) Nasopharyngeal Swab     Status: None   Collection Time: 03/05/21 11:35 AM   Specimen: Nasopharyngeal Swab; Nasopharyngeal(NP) swabs in vial transport medium  Result Value Ref Range   SARS Coronavirus 2 by RT PCR NEGATIVE NEGATIVE    Comment: (NOTE) SARS-CoV-2 target nucleic acids are NOT DETECTED.  The SARS-CoV-2 RNA is generally detectable in upper respiratory specimens during the acute phase of infection. The lowest concentration of SARS-CoV-2 viral copies this assay can detect is 138 copies/mL. A negative result does not preclude SARS-Cov-2 infection and should not be used as the sole basis for treatment or other patient management decisions. A negative result may occur with  improper specimen collection/handling, submission of specimen other than nasopharyngeal swab, presence of viral mutation(s) within the areas targeted by this assay, and inadequate number of viral copies(<138 copies/mL). A negative result must be combined with clinical observations, patient history, and epidemiological information. The expected result is Negative.  Fact Sheet for Patients:  BloggerCourse.com  Fact Sheet for Healthcare Providers:  SeriousBroker.it  This test is no t yet approved or cleared by the [place] FDA and  has been authorized for detection and/or diagnosis of SARS-CoV-2 by FDA under an Emergency Use Authorization (EUA). This EUA will remain  in effect (meaning this test can be used) for the duration of the COVID-19 declaration under Section 564(b)(1) of the Act, 21 U.S.C.section 360bbb-3(b)(1), unless the authorization is terminated  or revoked sooner.  Influenza A by PCR NEGATIVE NEGATIVE   Influenza B by PCR NEGATIVE NEGATIVE    Comment: (NOTE) The Xpert Xpress SARS-CoV-2/FLU/RSV plus assay is intended as an aid in the diagnosis of influenza from Nasopharyngeal swab  specimens and should not be used as a sole basis for treatment. Nasal washings and aspirates are unacceptable for Xpert Xpress SARS-CoV-2/FLU/RSV testing.  Fact Sheet for Patients: BloggerCourse.com  Fact Sheet for Healthcare Providers: SeriousBroker.it  This test is not yet approved or cleared by the [place] FDA and has been authorized for detection and/or diagnosis of SARS-CoV-2 by FDA under an Emergency Use Authorization (EUA). This EUA will remain in effect (meaning this test can be used) for the duration of the COVID-19 declaration under Section 564(b)(1) of the Act, 21 U.S.C. section 360bbb-3(b)(1), unless the authorization is terminated or revoked.  Performed at [facility], [address], [place], [place] [address]   Urine Drug Screen, [facility] only)     Status: Abnormal   Collection Time: 03/05/21 11:35 AM  Result Value Ref Range   Tricyclic, Ur Screen NONE DETECTED NONE DETECTED   Amphetamines, Ur Screen NONE DETECTED NONE DETECTED   MDMA (Ecstasy)Ur Screen NONE DETECTED NONE DETECTED   Cocaine Metabolite,Ur [place] POSITIVE (A) NONE DETECTED   Opiate, Ur Screen NONE DETECTED NONE DETECTED   Phencyclidine (PCP) Ur S NONE DETECTED NONE DETECTED   Cannabinoid 50 Ng, Ur [place] NONE DETECTED NONE DETECTED   Barbiturates, Ur Screen NONE DETECTED NONE DETECTED   Benzodiazepine, Ur Scrn NONE DETECTED NONE DETECTED   Methadone Scn, Ur NONE DETECTED NONE DETECTED    Comment: (NOTE) Tricyclics + metabolites, urine    Cutoff 1000 ng/mL Amphetamines + metabolites, urine  Cutoff 1000 ng/mL MDMA (Ecstasy), urine              Cutoff 500 ng/mL Cocaine Metabolite, urine          Cutoff 300 ng/mL Opiate + metabolites, urine        Cutoff 300 ng/mL Phencyclidine (PCP), urine         Cutoff 25 ng/mL Cannabinoid, urine                 Cutoff 50 ng/mL Barbiturates + metabolites, urine  Cutoff 200 ng/mL Benzodiazepine,  urine              Cutoff 200 ng/mL Methadone, urine                   Cutoff 300 ng/mL  The urine drug screen provides only a preliminary, unconfirmed analytical test result and should not be used for non-medical purposes. Clinical consideration and professional judgment should be applied to any positive drug screen result due to possible interfering substances. A more specific alternate chemical method must be used in order to obtain a confirmed analytical result. Gas chromatography / mass spectrometry (GC/MS) is the preferred confirm atory method. Performed at [facility], [address] [place], [place] [address]   Troponin I (High Sensitivity)     Status: None   Collection Time: 03/05/21 11:35 AM  Result Value Ref Range   Troponin I (High Sensitivity) 4 <18 ng/L    Comment: (NOTE) Elevated high sensitivity troponin I (hsTnI) values and significant  changes across serial measurements may suggest ACS but many other  chronic and acute conditions are known to elevate hsTnI results.  Refer to the "Links" section for chest pain algorithms and additional  guidance. Performed at [facility],  [address], [place], [place] [address]   Valproic acid level     Status: None   Collection Time: 03/05/21 11:35 AM  Result Value Ref Range   Valproic Acid Lvl 57 50.0 - 100.0 ug/mL    Comment: Performed at [facility], [address] [place], [place] [address]  Troponin I (High Sensitivity)     Status: None   Collection Time: 03/05/21  2:05 PM  Result Value Ref Range   Troponin I (High Sensitivity) 5 <18 ng/L    Comment: (NOTE) Elevated high sensitivity troponin I (hsTnI) values and significant  changes across serial measurements may suggest ACS but many other  chronic and acute conditions are known to elevate hsTnI results.  Refer to the "Links" section for chest pain algorithms and additional  guidance. Performed at [facility], [address] [place], [place] [address]     Current Facility-Administered Medications  Medication Dose Route Frequency Provider Last Rate Last Admin   atorvastatin (LIPITOR) tablet 20 mg  20 mg Oral Daily Gilles Chiquito, MD   20 mg at 03/06/21 1122   benztropine (COGENTIN) tablet 2 mg  2 mg Oral BID Gilles Chiquito, MD   2 mg at 03/06/21 1123   clonazePAM (KLONOPIN) tablet 1 mg  1 mg Oral BID Arionne Iams T, MD   1 mg at 03/06/21 1122   divalproex (DEPAKOTE) DR tablet 500 mg  500 mg Oral BID Gilles Chiquito, MD   500 mg at 03/06/21 1123   haloperidol (HALDOL) tablet 10 mg  10 mg Oral Daily Smayan Hackbart, [name], MD   10 mg at 03/06/21 1122   haloperidol (HALDOL) tablet 20 mg  20 mg Oral QHS Elianis Fischbach T, MD   20 mg at 03/05/21 2140   Current Outpatient Medications  Medication Sig Dispense Refill   amLODipine (NORVASC) 5 MG tablet Take 1 tablet (5 mg total) by mouth daily. 30 tablet 1   atorvastatin (LIPITOR) 20 MG tablet Take 20 mg by mouth daily.     benztropine (COGENTIN) 2 MG tablet Take 2 mg by mouth 2 (two) times daily.     Cholecalciferol 2000 units CAPS Take 2,000 Units by mouth daily.     clonazePAM (KLONOPIN) 1 MG tablet Take 1 mg by mouth 2 (two) times daily.     divalproex (DEPAKOTE) 500 MG DR tablet Take 1 tablet (500 mg total) by mouth 2 (two) times daily. 60 tablet 1   haloperidol (HALDOL) 10 MG tablet Take 1 tablet (10 mg total) by mouth in the morning AND 2 tablets (20 mg total) at bedtime. 90 tablet 1   haloperidol decanoate (HALDOL DECANOATE) 100 MG/ML injection Inject 150 mg into the muscle every 28 (twenty-eight) days.     losartan (COZAAR) 100 MG tablet Take 100 mg by mouth daily.      Musculoskeletal: Strength & Muscle Tone: within normal limits Gait & Station: normal Patient leans: N/A            Psychiatric Specialty Exam:  Presentation  General Appearance: Casual  Eye Contact:Good  Speech:Normal Rate  Speech  Volume:Normal  Handedness:Right   Mood and Affect  Mood:Euthymic  Affect:Congruent   Thought Process  Thought Processes:Goal Directed  Descriptions of Associations:Intact  Orientation:Full (Time, Place and Person)  Thought Content:Logical  History of Schizophrenia/Schizoaffective disorder:Yes  Duration of Psychotic Symptoms:Greater than six months  Hallucinations:No data recorded Ideas of Reference:None  Suicidal Thoughts:No data recorded Homicidal Thoughts:No data recorded  Sensorium  Memory:Immediate Fair; Recent Fair; Remote Poor  Judgment:Intact  Insight:Present   Executive Functions  Concentration:Fair  Attention Span:Fair  Recall:Fair  Fund of Knowledge:Fair  Language:Fair   Psychomotor Activity  Psychomotor Activity: No data recorded  Assets  Assets:Communication Skills; Desire for Improvement; Financial Resources/Insurance; Housing; Leisure Time; Resilience; Social Support   Sleep  Sleep: No data recorded  Physical Exam: Physical Exam Vitals and nursing note reviewed.  Constitutional:      Appearance: Normal appearance.  HENT:     Head: Normocephalic and atraumatic.     Mouth/Throat:     Pharynx: Oropharynx is clear.  Eyes:     Pupils: Pupils are equal, round, and reactive to light.  Cardiovascular:     Rate and Rhythm: Normal rate and regular rhythm.  Pulmonary:     Effort: Pulmonary effort is normal.     Breath sounds: Normal breath sounds.  Abdominal:     General: Abdomen is flat.     Palpations: Abdomen is soft.  Musculoskeletal:        General: Normal range of motion.  Skin:    General: Skin is warm and dry.  Neurological:     General: No focal deficit present.     Mental Status: He is alert. Mental status is at baseline.  Psychiatric:        Attention and Perception: He is inattentive.        Mood and Affect: Affect is blunt.        Speech: He is noncommunicative.   Review of Systems  Unable to perform ROS:  Psychiatric disorder  Blood pressure 119/72, pulse 82, temperature 98.7 F (37.1 C), temperature source Oral, resp. rate 16, height  (1.854 m), weight 80.1 kg, SpO2 93 %. Body mass index is 23.3 kg/m.  Treatment Plan Summary: Plan patient does not appear to meet commitment criteria.  He has not been aggressive or hostile and is cooperative with medication and treatment.  He has a safe place to live where they are familiar with his illness.  No need for further stay in the hospital or emergency room.  Recommend continuing medication.  Attempts were made at counseling about substance abuse.  Hard to tell whether he is paying attention or not.  Case reviewed with ER physician.  Discontinued IVC.  Disposition: No evidence of imminent risk to self or others at present.   Patient does not meet criteria for psychiatric inpatient admission. Supportive therapy provided about ongoing stressors.  Mordecai Rasmussen, MD 03/06/2021 11:43 AM

## 2021-03-06 NOTE — ED Notes (Signed)
Patient's group home stated they could not be here to pick up patient until at least 5pm. Instructed to pick up as soon as possible. Group home requested FL2 (is ready to be printed for discharge).

## 2021-03-06 NOTE — ED Notes (Signed)
Pt ambulatory to bathroom with one assist.

## 2021-03-06 NOTE — ED Notes (Signed)
Pt noted to have shallow breathing, pt placed on cardiac monitoring by Kennith Center, EDT and Kara Mead, EDT. Respirations range from 8-10 bpm. Will continue to monitor.

## 2021-03-06 NOTE — ED Notes (Addendum)
Upon discharging patient to [facility], staff states, "Something is not right, he is not normally like this."  This RN explained to staff that it was our understanding that this was his baseline; staff states otherwise.  States he is normally much more alert and verbally communicative than he currently presents.  This RN returned patient to 24H.  EDP, Derrill Kay and charge RN, Erskine Squibb made aware of concerns; see new orders.  Patient will remain in street clothes at this time.

## 2021-03-06 NOTE — ED Notes (Signed)
Gave breakfast tray with juice. 

## 2021-03-06 NOTE — ED Notes (Signed)
Jeannett Senior, Charge RN notified that patient is now medical, advised to be roomed accordingly due to patient presentation.

## 2021-03-06 NOTE — ED Provider Notes (Signed)
When patient was going to be discharged group home staff member stated patient was acting not like himself. On my exam patient was slightly somnolent. Because of this did order repeat blood work. Ammonia yesterday was elevated so had concern this could have worsened. However blood work today without any obvious cause of patients symptoms. Did order MRI to further evaluate.    Phineas Semen, MD 03/06/21 [id number]

## 2021-03-06 NOTE — TOC Initial Note (Signed)
Transition of Care [facility]) - Initial/Assessment Note    Patient Details  Name: Henry Black MRN: [id number] Date of Birth: 04/28/1956  [facility] CM/SW Contact:    Marina Goodell Phone Number: [phone] 03/06/2021, 2:05 PM  Clinical Narrative:                  Patient will return to [facility] group home with AVS and FL2 per request. Patient does not meet inpatient psychiatric criteria.        Patient Goals and CMS Choice        Expected Discharge Plan and Services                                                Prior Living Arrangements/Services                       Activities of Daily Living      Permission Sought/Granted                  Emotional Assessment              Admission diagnosis:  hypertension ems Patient Active Problem List   Diagnosis Date Noted   Cocaine abuse (HCC) 03/05/2021   Essential hypertension 11/27/2020   Schizoaffective disorder (HCC) 11/26/2020   Schizophrenia, undifferentiated (HCC) 11/26/2020   Chronic hepatitis C (HCC) 03/29/2020   Hepatic cirrhosis (HCC) 03/29/2020   Aggressive behavior 09/17/2018   PCP:  Sherrie Mustache, MD Pharmacy:   [facility], [place] - [address] [place] [address] Phone: [phone] Fax: [phone]     Social Determinants of Health (SDOH) Interventions    Readmission Risk Interventions No flowsheet data found.

## 2021-03-06 NOTE — ED Notes (Signed)
Pt is resting in bed on the pulse oximetry at this time. Pt is asleep. Equal rise and fall of the chest will continue to monitor.

## 2021-03-07 ENCOUNTER — Encounter: Payer: Self-pay | Admitting: Radiology

## 2021-03-07 ENCOUNTER — Emergency Department: Payer: Medicare Other

## 2021-03-07 DIAGNOSIS — E785 Hyperlipidemia, unspecified: Secondary | ICD-10-CM | POA: Diagnosis present

## 2021-03-07 DIAGNOSIS — R400 Somnolence: Secondary | ICD-10-CM | POA: Diagnosis present

## 2021-03-07 DIAGNOSIS — N179 Acute kidney failure, unspecified: Secondary | ICD-10-CM | POA: Diagnosis present

## 2021-03-07 DIAGNOSIS — Z880 Allergy status to penicillin: Secondary | ICD-10-CM | POA: Diagnosis not present

## 2021-03-07 DIAGNOSIS — G9341 Metabolic encephalopathy: Secondary | ICD-10-CM

## 2021-03-07 DIAGNOSIS — T424X5A Adverse effect of benzodiazepines, initial encounter: Secondary | ICD-10-CM | POA: Diagnosis present

## 2021-03-07 DIAGNOSIS — K7460 Unspecified cirrhosis of liver: Secondary | ICD-10-CM | POA: Diagnosis present

## 2021-03-07 DIAGNOSIS — F203 Undifferentiated schizophrenia: Secondary | ICD-10-CM

## 2021-03-07 DIAGNOSIS — J690 Pneumonitis due to inhalation of food and vomit: Secondary | ICD-10-CM | POA: Diagnosis present

## 2021-03-07 DIAGNOSIS — F1410 Cocaine abuse, uncomplicated: Secondary | ICD-10-CM | POA: Diagnosis present

## 2021-03-07 DIAGNOSIS — I671 Cerebral aneurysm, nonruptured: Secondary | ICD-10-CM | POA: Diagnosis present

## 2021-03-07 DIAGNOSIS — D649 Anemia, unspecified: Secondary | ICD-10-CM | POA: Diagnosis present

## 2021-03-07 DIAGNOSIS — J9601 Acute respiratory failure with hypoxia: Secondary | ICD-10-CM

## 2021-03-07 DIAGNOSIS — T428X5A Adverse effect of antiparkinsonism drugs and other central muscle-tone depressants, initial encounter: Secondary | ICD-10-CM | POA: Diagnosis present

## 2021-03-07 DIAGNOSIS — I10 Essential (primary) hypertension: Secondary | ICD-10-CM | POA: Diagnosis present

## 2021-03-07 DIAGNOSIS — Z20822 Contact with and (suspected) exposure to covid-19: Secondary | ICD-10-CM | POA: Diagnosis present

## 2021-03-07 DIAGNOSIS — R4182 Altered mental status, unspecified: Secondary | ICD-10-CM | POA: Diagnosis present

## 2021-03-07 DIAGNOSIS — F17210 Nicotine dependence, cigarettes, uncomplicated: Secondary | ICD-10-CM | POA: Diagnosis present

## 2021-03-07 DIAGNOSIS — Z79899 Other long term (current) drug therapy: Secondary | ICD-10-CM

## 2021-03-07 LAB — BASIC METABOLIC PANEL
Anion gap: 9 (ref 5–15)
BUN: 21 mg/dL (ref 8–23)
CO2: 32 mmol/L (ref 22–32)
Calcium: 9.4 mg/dL (ref 8.9–10.3)
Chloride: 96 mmol/L — ABNORMAL LOW (ref 98–111)
Creatinine, Ser: 1.09 mg/dL (ref 0.61–1.24)
GFR, Estimated: >60 mL/min (ref >60)
Glucose, Bld: 80 mg/dL (ref 70–99)
Potassium: 4.6 mmol/L (ref 3.5–5.1)
Sodium: 137 mmol/L (ref 135–145)

## 2021-03-07 LAB — CBC
HCT: 34.9 % — ABNORMAL LOW (ref 39.0–52.0)
Hemoglobin: 11.6 g/dL — ABNORMAL LOW (ref 13.0–17.0)
MCH: 29.7 pg (ref 26.0–34.0)
MCHC: 33.2 g/dL (ref 30.0–36.0)
MCV: 89.5 fL (ref 80.0–100.0)
Platelets: 127 10*3/uL — ABNORMAL LOW (ref 150–400)
RBC: 3.9 MIL/uL — ABNORMAL LOW (ref 4.22–5.81)
RDW: 13.3 % (ref 11.5–15.5)
WBC: 8.1 10*3/uL (ref 4.0–10.5)
nRBC: 0 % (ref 0.0–0.2)

## 2021-03-07 LAB — SARS CORONAVIRUS 2 (TAT 6-24 HRS): SARS Coronavirus 2: NEGATIVE

## 2021-03-07 LAB — HIV ANTIBODY (ROUTINE TESTING W REFLEX): HIV Screen 4th Generation wRfx: NONREACTIVE

## 2021-03-07 LAB — PROCALCITONIN: Procalcitonin: <0.1 ng/mL

## 2021-03-07 MED ORDER — LOSARTAN POTASSIUM 50 MG PO TABS
100.0000 mg | ORAL_TABLET | Freq: Every day | ORAL | Status: DC
  Administered 2021-03-07 – 2021-03-08 (×2): 100 mg via ORAL
  Filled 2021-03-07 (×2): qty 2

## 2021-03-07 MED ORDER — ENOXAPARIN SODIUM 40 MG/0.4ML IJ SOSY
40.0000 mg | PREFILLED_SYRINGE | INTRAMUSCULAR | Status: DC
  Administered 2021-03-07 – 2021-03-08 (×2): 40 mg via SUBCUTANEOUS
  Filled 2021-03-07 (×2): qty 0.4

## 2021-03-07 MED ORDER — LORAZEPAM 2 MG/ML IJ SOLN
INTRAMUSCULAR | Status: AC
  Administered 2021-03-07: 1 mg via INTRAVENOUS
  Filled 2021-03-07: qty 1

## 2021-03-07 MED ORDER — VITAMIN D 25 MCG (1000 UNIT) PO TABS
2000.0000 [IU] | ORAL_TABLET | Freq: Every day | ORAL | Status: DC
  Administered 2021-03-08: 2000 [IU] via ORAL
  Filled 2021-03-07: qty 2

## 2021-03-07 MED ORDER — ONDANSETRON HCL 4 MG/2ML IJ SOLN: 4.0000 mg | Freq: Four times a day (QID) | INTRAMUSCULAR | Status: DC | PRN

## 2021-03-07 MED ORDER — TRAZODONE HCL 50 MG PO TABS
25.0000 mg | ORAL_TABLET | Freq: Every evening | ORAL | Status: DC | PRN
  Administered 2021-03-07: 25 mg via ORAL
  Filled 2021-03-07: qty 1

## 2021-03-07 MED ORDER — SODIUM CHLORIDE 0.9 % IV SOLN
2.0000 g | INTRAVENOUS | Status: DC
  Administered 2021-03-07 – 2021-03-08 (×2): 2 g via INTRAVENOUS
  Filled 2021-03-07 (×2): qty 20

## 2021-03-07 MED ORDER — SODIUM CHLORIDE 0.9 % IV SOLN: 3.0000 g | Freq: Once | INTRAVENOUS | Status: DC

## 2021-03-07 MED ORDER — CLONAZEPAM 0.5 MG PO TABS
0.5000 mg | ORAL_TABLET | Freq: Two times a day (BID) | ORAL | Status: DC
  Administered 2021-03-07 – 2021-03-08 (×2): 0.5 mg via ORAL
  Filled 2021-03-07 (×2): qty 1

## 2021-03-07 MED ORDER — AZITHROMYCIN 500 MG IV SOLR
500.0000 mg | INTRAVENOUS | Status: DC
Start: 2021-03-07 — End: 2021-03-08
  Administered 2021-03-07 – 2021-03-08 (×2): 500 mg via INTRAVENOUS
  Filled 2021-03-07 (×2): qty 500

## 2021-03-07 MED ORDER — LACTATED RINGERS IV BOLUS
1000.0000 mL | Freq: Once | INTRAVENOUS | Status: AC
  Administered 2021-03-07: 1000 mL via INTRAVENOUS

## 2021-03-07 MED ORDER — ACETAMINOPHEN 650 MG RE SUPP: 650.0000 mg | Freq: Four times a day (QID) | RECTAL | Status: DC | PRN

## 2021-03-07 MED ORDER — LORAZEPAM 2 MG/ML IJ SOLN: 1.0000 mg | Freq: Once | INTRAMUSCULAR | Status: AC

## 2021-03-07 MED ORDER — IOHEXOL 350 MG/ML SOLN
75.0000 mL | Freq: Once | INTRAVENOUS | Status: AC | PRN
  Administered 2021-03-07: 75 mL via INTRAVENOUS

## 2021-03-07 MED ORDER — AMLODIPINE BESYLATE 5 MG PO TABS
5.0000 mg | ORAL_TABLET | Freq: Every day | ORAL | Status: DC
  Administered 2021-03-07 – 2021-03-08 (×2): 5 mg via ORAL
  Filled 2021-03-07 (×2): qty 1

## 2021-03-07 MED ORDER — IPRATROPIUM-ALBUTEROL 0.5-2.5 (3) MG/3ML IN SOLN
3.0000 mL | Freq: Three times a day (TID) | RESPIRATORY_TRACT | Status: DC
  Administered 2021-03-08: 08:00:00 3 mL via RESPIRATORY_TRACT
  Filled 2021-03-07: qty 3

## 2021-03-07 MED ORDER — ONDANSETRON HCL 4 MG PO TABS: 4.0000 mg | ORAL_TABLET | Freq: Four times a day (QID) | ORAL | Status: DC | PRN

## 2021-03-07 MED ORDER — ACETAMINOPHEN 325 MG PO TABS: 650.0000 mg | ORAL_TABLET | Freq: Four times a day (QID) | ORAL | Status: DC | PRN

## 2021-03-07 MED ORDER — METRONIDAZOLE 500 MG/100ML IV SOLN
500.0000 mg | Freq: Three times a day (TID) | INTRAVENOUS | Status: DC
  Administered 2021-03-07 – 2021-03-08 (×4): 500 mg via INTRAVENOUS
  Filled 2021-03-07 (×6): qty 100

## 2021-03-07 MED ORDER — IPRATROPIUM-ALBUTEROL 0.5-2.5 (3) MG/3ML IN SOLN
3.0000 mL | Freq: Four times a day (QID) | RESPIRATORY_TRACT | Status: DC
  Administered 2021-03-07 (×2): 3 mL via RESPIRATORY_TRACT
  Filled 2021-03-07 (×2): qty 3

## 2021-03-07 MED ORDER — SODIUM CHLORIDE 0.9 % IV SOLN: INTRAVENOUS | Status: DC

## 2021-03-07 MED ORDER — GUAIFENESIN ER 600 MG PO TB12
600.0000 mg | ORAL_TABLET | Freq: Two times a day (BID) | ORAL | Status: DC
  Administered 2021-03-07 – 2021-03-08 (×3): 600 mg via ORAL
  Filled 2021-03-07 (×3): qty 1

## 2021-03-07 MED ORDER — MAGNESIUM HYDROXIDE 400 MG/5ML PO SUSP
30.0000 mL | Freq: Every day | ORAL | Status: DC | PRN
  Filled 2021-03-07: qty 30

## 2021-03-07 NOTE — Evaluation (Signed)
Clinical/Bedside Swallow Evaluation Patient Details  Name: Henry Black MRN: [id number] Date of Birth: [birth year]  Today's Date: 03/07/2021 Time: SLP Start Time (ACUTE ONLY): 1145 SLP Stop Time (ACUTE ONLY): 1245 SLP Time Calculation (min) (ACUTE ONLY): 60 min  Past Medical History:  Past Medical History:  Diagnosis Date   Hepatitis    Hypertension    Past Surgical History:  Past Surgical History:  Procedure Laterality Date   ESOPHAGOGASTRODUODENOSCOPY (EGD) WITH PROPOFOL N/A 12/21/2019   Procedure: ESOPHAGOGASTRODUODENOSCOPY (EGD) WITH PROPOFOL;  Surgeon: Toney Reil, MD;  Location: [facility] ENDOSCOPY;  Service: Gastroenterology;  Laterality: N/A;   HPI:  Pt is a 65 year old man with schizophrenia.  He had presented to the emergency room with altered mental status after a night going out and probably using drugs.  He had not been agitated or aggressive and I had thought that he could appropriately return to his group home but the group home declined to keep him saying he is not at his baseline.  Pt's medical history significant for hypertension and hepatitis, liver cirrhosis, undifferentiated schizophrenia and schizoaffective disorder as well as history of cocaine abuse.  CXR: Mild patchy lingular and bilateral lower lobe opacities, atelectasis  versus pneumonia, unchanged.   Assessment / Plan / Recommendation Clinical Impression  Pt appears to present w/ grossly adequate oropharyngeal phase function of swallowing in light of declined Cognitive status; unsure of his Baseline. This presentation can impact overall awareness/timing of swallow and safety during po tasks which increases risk for aspiration, choking. Pt's risk for aspiration appears to be able to be reduced when following general aspiration precautions and using a modified diet consistency w/ Minced foods -- pt is edentulous. He also required verbal/visual cues during po tasks.       Pt consumed several trials of ice chips,  purees, soft solids and thin liquids w/ No immediate, overt clinical s/s of aspiration noted; no decline in vocal quality; no cough, and no decline in respiratory status during/post trials. Oral phase was adequate for bolus management and oral clearing of the boluses/trials given. Mastication of softened solids was lengthier - pt edentulous. Pt attempted self-feeding but required Min-Mod support and guidance d/t Cognitive decline and poor coordination. He was able to hold own Cup during drinking which improves safety of swallowing. OM Exam appeared [facility] w/ No unilateral weakness noted; though cursory. Confusion of OM tasks and oral care noted.         D/t pt's Baseline, declined Cognitive status and risk for aspiration, recommend initiation of the dysphagia level 2(inced foods) w/ thin liquids; general aspiration precautions; reduce Distractions during meals and engage pt during po's at meal for self-feeding. Pills Crushed in Puree for safer swallowing. Support w/ feeding at meals as needed. MD/NSG updated. ST services recommends follow w/ Palliative Care for GOC and education re: impact of Cognitive decline on swallowing; nutrition. Recommend Dietician f/u. SLP Visit Diagnosis: Dysphagia, unspecified (R13.10) (poor Cognitiion)    Aspiration Risk  Mild aspiration risk;Risk for inadequate nutrition/hydration (reduced w/ precautions)    Diet Recommendation  dysphagia level 2(inced foods) w/ thin liquids; general aspiration precautions; reduce Distractions during meals and engage pt during po's at meal for self-feeding. Support w/ feeding at meals as needed.  Medication Administration: Whole meds with puree (vs Crushed vs w/ liquids)    Other  Recommendations Recommended Consults:  (Dietician f/u) Oral Care Recommendations: Oral care BID;Oral care before and after PO;Staff/trained caregiver to provide oral care Other Recommendations:  (n/a)   Follow  up Recommendations None (TBD)      Frequency and  Duration min 2x/week  2 weeks       Prognosis Prognosis for Safe Diet Advancement: Fair Barriers to Reach Goals: Cognitive deficits;Time post onset;Severity of deficits;Behavior      Swallow Study   General Date of Onset: 03/05/21 HPI: Pt is a 65 year old man with schizophrenia.  He had presented to the emergency room with altered mental status after a night going out and probably using drugs.  He had not been agitated or aggressive and I had thought that he could appropriately return to his group home but the group home declined to keep him saying he is not at his baseline.  Pt's medical history significant for hypertension and hepatitis, liver cirrhosis, undifferentiated schizophrenia and schizoaffective disorder as well as history of cocaine abuse.  CXR: Mild patchy lingular and bilateral lower lobe opacities, atelectasis  versus pneumonia, unchanged. Type of Study: Bedside Swallow Evaluation Previous Swallow Assessment: none noted Diet Prior to this Study: NPO Temperature Spikes Noted: No (wbc 8.1) Respiratory Status: Room air History of Recent Intubation: No Behavior/Cognition: Alert;Cooperative;Pleasant mood;Confused;Distractible;Requires cueing (mostly nonverbal except for "no" x1) Oral Cavity Assessment:  (sticky) Oral Care Completed by SLP: Yes Oral Cavity - Dentition: Edentulous Vision: Functional for self-feeding (appeared) Self-Feeding Abilities: Able to feed self;Needs assist;Needs set up;Total assist (uncoordinated) Patient Positioning: Upright in chair Baseline Vocal Quality:  (nonverbal) Volitional Cough: Cognitively unable to elicit Volitional Swallow: Unable to elicit    Oral/Motor/Sensory Function Overall Oral Motor/Sensory Function: Within functional limits (appeared w/ bolus management; sucking on swabs)   Ice Chips Ice chips: Within functional limits Presentation: Spoon (fed; 3 trials)   Thin Liquid Thin Liquid: Within functional limits Presentation: Cup;Self  Fed;Straw (5 trials via each)    Nectar Thick Nectar Thick Liquid: Not tested   Honey Thick Honey Thick Liquid: Not tested   Puree Puree: Within functional limits Presentation: Spoon (fed; 10 trials)   Solid     Solid: Impaired Presentation: Spoon (fed; 8 trials) Oral Phase Impairments: Impaired mastication Pharyngeal Phase Impairments:  (none) Other Comments: edentulous        Jerilynn Som, MS, CCC-SLP Speech Language Pathologist Rehab Services [phone] [facility] 03/07/2021,3:14 PM

## 2021-03-07 NOTE — ED Notes (Addendum)
Patient cleaned after incontinence of urine. New brief placed.

## 2021-03-07 NOTE — Consult Note (Signed)
[facility] Face-to-Face Psychiatry Consult   Reason for Consult: Consult for 65 year old man with schizophrenia Referring Physician:Amin Patient Identification: Henry Black MRN:  [id number] Principal Diagnosis: Schizophrenia, undifferentiated (HCC) Diagnosis:  Principal Problem:   Schizophrenia, undifferentiated (HCC) Active Problems:   Cocaine abuse (HCC)   Aspiration pneumonia (HCC)   Total Time spent with patient: 1 hour  Subjective:   Henry Black is a 65 y.o. male patient admitted with patient not currently able to articulate anything.  HPI: Patient seen chart reviewed.  Patient had previously been seen in the emergency room.  65 year old man with schizophrenia.  He had presented to the emergency room with altered mental status after a night going out and probably using drugs.  He had not been agitated or aggressive and I had thought that he could appropriately return to his group home but the group home declined to keep him saying he is not at his baseline.  Came by to see him in his room on the medical service.  Patient was sitting up in a chair next to the bed.  Had a towel over his head.  Could open his eyes a little bit when spoken to but mostly kept them shut.  Answered a couple of questions with yeses her nose very quietly but otherwise not responsive.  He does appear to be awake but drowsy.  When asked to try eating he was able to drink thin liquids indicating good swallowing but he fumbled his food probably from hand eye coordination.  Patient does not appear to be actively hallucinating he has not been aggressive or violent.  On examination there is no sign of any stiffness in his muscles.  So far work-up unrevealing.  Vitals stable.  Past Psychiatric History: Longstanding schizophrenia undifferentiated with a low baseline of functioning.  Intermittent substance abuse.  Had previously been stable on Haldol and Depakote  Risk to Self:   Risk to Others:   Prior Inpatient Therapy:    Prior Outpatient Therapy:    Past Medical History:  Past Medical History:  Diagnosis Date   Hepatitis    Hypertension     Past Surgical History:  Procedure Laterality Date   ESOPHAGOGASTRODUODENOSCOPY (EGD) WITH PROPOFOL N/A 12/21/2019   Procedure: ESOPHAGOGASTRODUODENOSCOPY (EGD) WITH PROPOFOL;  Surgeon: Toney Reil, MD;  Location: [facility] ENDOSCOPY;  Service: Gastroenterology;  Laterality: N/A;   Family History: History reviewed. No pertinent family history. Family Psychiatric  History: None reported Social History:  Social History   Substance and Sexual Activity  Alcohol Use Not Currently   Alcohol/week: 1.0 standard drink   Types: 1 Standard drinks or equivalent per week     Social History   Substance and Sexual Activity  Drug Use Not Currently   Types: "Crack" cocaine    Social History   Socioeconomic History   Marital status: Single    Spouse name: Not on file   Number of children: Not on file   Years of education: Not on file   Highest education level: Not on file  Occupational History   Not on file  Tobacco Use   Smoking status: Every Day    Packs/day: 0.50    Years: 46.00    Pack years: 23.00    Types: Cigarettes   Smokeless tobacco: Never  Vaping Use   Vaping Use: Never used  Substance and Sexual Activity   Alcohol use: Not Currently    Alcohol/week: 1.0 standard drink    Types: 1 Standard drinks or equivalent per week  Drug use: Not Currently    Types: "Crack" cocaine   Sexual activity: Not on file  Other Topics Concern   Not on file  Social History Narrative   Not on file   Social Determinants of Health   Financial Resource Strain: Not on file  Food Insecurity: Not on file  Transportation Needs: Not on file  Physical Activity: Not on file  Stress: Not on file  Social Connections: Not on file   Additional Social History:    Allergies:   Allergies  Allergen Reactions   Penicillins Rash    Has patient had a PCN reaction  causing immediate rash, facial/tongue/throat swelling, SOB or lightheadedness with hypotension: Unknown Has patient had a PCN reaction causing severe rash involving mucus membranes or skin necrosis: Unknown Has patient had a PCN reaction that required hospitalization: Unknown Has patient had a PCN reaction occurring within the last 10 years: Unknown If all of the above answers are "NO", then may proceed with Cephalosporin use.     Labs:  Results for orders placed or performed during the hospital encounter of 03/05/21 (from the past 48 hour(s))  Lactic acid, plasma     Status: None   Collection Time: 03/05/21  5:28 PM  Result Value Ref Range   Lactic Acid, Venous 1.1 0.5 - 1.9 mmol/L    Comment: Performed at [facility], [address] [place], [place] [address]  Comprehensive metabolic panel     Status: Abnormal   Collection Time: 03/06/21  5:28 PM  Result Value Ref Range   Sodium 141 135 - 145 mmol/L   Potassium 5.1 3.5 - 5.1 mmol/L   Chloride 104 98 - 111 mmol/L   CO2 32 22 - 32 mmol/L   Glucose, Bld 94 70 - 99 mg/dL    Comment: Glucose reference range applies only to samples taken after fasting for at least 8 hours.   BUN 26 (H) 8 - 23 mg/dL   Creatinine, Ser 6.04 0.61 - 1.24 mg/dL   Calcium 9.6 8.9 - 54.0 mg/dL   Total Protein 7.3 6.5 - 8.1 g/dL   Albumin 3.7 3.5 - 5.0 g/dL   AST 19 15 - 41 U/L   ALT 16 0 - 44 U/L   Alkaline Phosphatase 47 38 - 126 U/L   Total Bilirubin 0.8 0.3 - 1.2 mg/dL   GFR, Estimated >98 >11 mL/min    Comment: (NOTE) Calculated using the CKD-EPI Creatinine Equation (2021)    Anion gap 5 5 - 15    Comment: Performed at [facility], [address] [place], [place] [address]  CBC with Differential     Status: Abnormal   Collection Time: 03/06/21  5:28 PM  Result Value Ref Range   WBC 9.4 4.0 - 10.5 K/uL   RBC 3.89 (L) 4.22 - 5.81 MIL/uL   Hemoglobin 11.8 (L) 13.0 - 17.0 g/dL   HCT 29.5 (L) 62.1 - 30.8 %   MCV 92.5 80.0 -  100.0 fL   MCH 30.3 26.0 - 34.0 pg   MCHC 32.8 30.0 - 36.0 g/dL   RDW 65.7 84.6 - 96.2 %   Platelets 127 (L) 150 - 400 K/uL   nRBC 0.0 0.0 - 0.2 %   Neutrophils Relative % 52 %   Neutro Abs 4.9 1.7 - 7.7 K/uL   Lymphocytes Relative 32 %   Lymphs Abs 3.0 0.7 - 4.0 K/uL   Monocytes Relative 13 %   Monocytes Absolute 1.2 (H) 0.1 -  1.0 K/uL   Eosinophils Relative 3 %   Eosinophils Absolute 0.3 0.0 - 0.5 K/uL   Basophils Relative 0 %   Basophils Absolute 0.0 0.0 - 0.1 K/uL   Immature Granulocytes 0 %   Abs Immature Granulocytes 0.02 0.00 - 0.07 K/uL    Comment: Performed at [facility], [address] [place], [place] [address]  Ammonia     Status: None   Collection Time: 03/06/21  7:44 PM  Result Value Ref Range   Ammonia 13 9 - 35 umol/L    Comment: Performed at [facility], [address] [place], [place] [address]  Procalcitonin - Baseline     Status: None   Collection Time: 03/07/21  7:21 AM  Result Value Ref Range   Procalcitonin <0.10 ng/mL    Comment:        Interpretation: PCT (Procalcitonin) <= 0.5 ng/mL: Systemic infection (sepsis) is not likely. Local bacterial infection is possible. (NOTE)       Sepsis PCT Algorithm           Lower Respiratory Tract                                      Infection PCT Algorithm    ----------------------------     ----------------------------         PCT < 0.25 ng/mL                PCT < 0.10 ng/mL          Strongly encourage             Strongly discourage   discontinuation of antibiotics    initiation of antibiotics    ----------------------------     -----------------------------       PCT 0.25 - 0.50 ng/mL            PCT 0.10 - 0.25 ng/mL               OR       >80% decrease in PCT            Discourage initiation of                                            antibiotics      Encourage discontinuation           of antibiotics    ----------------------------     -----------------------------         PCT  >= 0.50 ng/mL              PCT 0.26 - 0.50 ng/mL               AND        <80% decrease in PCT             Encourage initiation of                                             antibiotics       Encourage continuation           of antibiotics    ----------------------------     -----------------------------  PCT >= 0.50 ng/mL                  PCT > 0.50 ng/mL               AND         increase in PCT                  Strongly encourage                                      initiation of antibiotics    Strongly encourage escalation           of antibiotics                                     -----------------------------                                           PCT <= 0.25 ng/mL                                                 OR                                        > 80% decrease in PCT                                      Discontinue / Do not initiate                                             antibiotics  Performed at [facility], [address] [place], [place] [address]   HIV Antibody (routine testing w rflx)     Status: None   Collection Time: 03/07/21  7:21 AM  Result Value Ref Range   HIV Screen 4th Generation wRfx Non Reactive Non Reactive    Comment: Performed at [facility], [address], [place], [place] [address]  Basic metabolic panel     Status: Abnormal   Collection Time: 03/07/21  7:21 AM  Result Value Ref Range   Sodium 137 135 - 145 mmol/L   Potassium 4.6 3.5 - 5.1 mmol/L   Chloride 96 (L) 98 - 111 mmol/L   CO2 32 22 - 32 mmol/L   Glucose, Bld 80 70 - 99 mg/dL    Comment: Glucose reference range applies only to samples taken after fasting for at least 8 hours.   BUN 21 8 - 23 mg/dL   Creatinine, Ser 0.16 0.61 - 1.24 mg/dL   Calcium 9.4 8.9 - 01.0 mg/dL   GFR, Estimated >93 >23 mL/min    Comment: (NOTE) Calculated using the CKD-EPI Creatinine Equation (2021)    Anion gap 9 5 - 15    Comment:  Performed at [facility],  [address] [place], [place] [address]  CBC     Status: Abnormal   Collection Time: 03/07/21  7:21 AM  Result Value Ref Range   WBC 8.1 4.0 - 10.5 K/uL   RBC 3.90 (L) 4.22 - 5.81 MIL/uL   Hemoglobin 11.6 (L) 13.0 - 17.0 g/dL   HCT 15.1 (L) 76.1 - 60.7 %   MCV 89.5 80.0 - 100.0 fL   MCH 29.7 26.0 - 34.0 pg   MCHC 33.2 30.0 - 36.0 g/dL   RDW 37.1 06.2 - 69.4 %   Platelets 127 (L) 150 - 400 K/uL   nRBC 0.0 0.0 - 0.2 %    Comment: Performed at [facility], [address], [place], [place] [address]    Current Facility-Administered Medications  Medication Dose Route Frequency Provider Last Rate Last Admin   0.9 %  sodium chloride infusion   Intravenous Continuous Mansy, Jan A, MD 100 mL/hr at 03/07/21 0706 New Bag at 03/07/21 0706   acetaminophen (TYLENOL) tablet 650 mg  650 mg Oral Q6H PRN Mansy, Jan A, MD       Or   acetaminophen (TYLENOL) suppository 650 mg  650 mg Rectal Q6H PRN Mansy, Jan A, MD       amLODipine (NORVASC) tablet 5 mg  5 mg Oral Daily Mansy, Jan A, MD   5 mg at 03/07/21 1347   atorvastatin (LIPITOR) tablet 20 mg  20 mg Oral Daily Gilles Chiquito, MD   20 mg at 03/07/21 1348   azithromycin (ZITHROMAX) 500 mg in sodium chloride 0.9 % 250 mL IVPB  500 mg Intravenous Q24H Mansy, Jan A, MD   Stopped at 03/07/21 0805   benztropine (COGENTIN) tablet 2 mg  2 mg Oral BID Gilles Chiquito, MD   2 mg at 03/06/21 2314   cefTRIAXone (ROCEPHIN) 2 g in sodium chloride 0.9 % 100 mL IVPB  2 g Intravenous Q24H Mansy, Jan A, MD   Stopped at 03/07/21 0800   [START ON 03/08/2021] cholecalciferol (VITAMIN D3) tablet 2,000 Units  2,000 Units Oral Daily Mansy, Jan A, MD       clonazePAM Scarlette Calico) tablet 0.5 mg  0.5 mg Oral BID Kimi Bordeau T, MD       divalproex (DEPAKOTE) DR tablet 500 mg  500 mg Oral BID Gilles Chiquito, MD   500 mg at 03/07/21 1348   enoxaparin (LOVENOX) injection 40 mg  40 mg Subcutaneous Q24H Mansy, Jan A, MD   40 mg at 03/07/21 0901   guaiFENesin  (MUCINEX) 12 hr tablet 600 mg  600 mg Oral BID Mansy, Jan A, MD   600 mg at 03/07/21 1347   haloperidol (HALDOL) tablet 10 mg  10 mg Oral Daily Bettyann Birchler T, MD   10 mg at 03/07/21 1347   haloperidol (HALDOL) tablet 20 mg  20 mg Oral QHS Athina Fahey T, MD   20 mg at 03/06/21 2313   ipratropium-albuterol (DUONEB) 0.5-2.5 (3) MG/3ML nebulizer solution 3 mL  3 mL Nebulization QID Mansy, Jan A, MD   3 mL at 03/07/21 1135   losartan (COZAAR) tablet 100 mg  100 mg Oral Daily Mansy, Jan A, MD   100 mg at 03/07/21 1347   magnesium hydroxide (MILK OF MAGNESIA) suspension 30 mL  30 mL Oral Daily PRN Mansy, Jan A, MD       metroNIDAZOLE (FLAGYL) IVPB 500 mg  500 mg Intravenous Q8H Mansy, Jan A,  MD   Stopped at 03/07/21 1025   ondansetron (ZOFRAN) tablet 4 mg  4 mg Oral Q6H PRN Mansy, Jan A, MD       Or   ondansetron [facility]) injection 4 mg  4 mg Intravenous Q6H PRN Mansy, Jan A, MD       traZODone (DESYREL) tablet 25 mg  25 mg Oral QHS PRN Mansy, Vernetta Honey, MD        Musculoskeletal: Strength & Muscle Tone: within normal limits Gait & Station:  Not tested Patient leans: N/A            Psychiatric Specialty Exam:  Presentation  General Appearance: Casual  Eye Contact:Good  Speech:Normal Rate  Speech Volume:Normal  Handedness:Right   Mood and Affect  Mood:Euthymic  Affect:Congruent   Thought Process  Thought Processes:Goal Directed  Descriptions of Associations:Intact  Orientation:Full (Time, Place and Person)  Thought Content:Logical  History of Schizophrenia/Schizoaffective disorder:Yes  Duration of Psychotic Symptoms:Greater than six months  Hallucinations:No data recorded Ideas of Reference:None  Suicidal Thoughts:No data recorded Homicidal Thoughts:No data recorded  Sensorium  Memory:Immediate Fair; Recent Fair; Remote Poor  Judgment:Intact  Insight:Present   Executive Functions  Concentration:Fair  Attention Span:Fair  Recall:Fair  Fund of  Knowledge:Fair  Language:Fair   Psychomotor Activity  Psychomotor Activity: No data recorded  Assets  Assets:Communication Skills; Desire for Improvement; Financial Resources/Insurance; Housing; Leisure Time; Resilience; Social Support   Sleep  Sleep: No data recorded  Physical Exam: Physical Exam Vitals and nursing note reviewed.  Constitutional:      Appearance: Normal appearance.  HENT:     Head: Normocephalic and atraumatic.     Mouth/Throat:     Pharynx: Oropharynx is clear.  Eyes:     Pupils: Pupils are equal, round, and reactive to light.  Cardiovascular:     Rate and Rhythm: Normal rate and regular rhythm.  Pulmonary:     Effort: Pulmonary effort is normal.     Breath sounds: Normal breath sounds.  Abdominal:     General: Abdomen is flat.     Palpations: Abdomen is soft.  Musculoskeletal:        General: Normal range of motion.  Skin:    General: Skin is warm and dry.  Neurological:     General: No focal deficit present.     Mental Status: He is alert. Mental status is at baseline.  Psychiatric:        Attention and Perception: He is inattentive.        Mood and Affect: Mood normal. Affect is blunt.        Speech: He is noncommunicative.        Behavior: Behavior is slowed and withdrawn.        Cognition and Memory: Cognition is impaired.   Review of Systems  Unable to perform ROS: Mental status change  Blood pressure 127/77, pulse 72, temperature 97.8 F (36.6 C), resp. rate 18, height  (1.854 m), weight 80.1 kg, SpO2 95 %. Body mass index is 23.3 kg/m.  Treatment Plan Summary: Medication management and Plan 65 year old man with schizophrenia.  Currently partially slowed not verbal but also not agitated.  In the differential would be catatonia although his musculature presentation is not consistent with it.  Still catatonia can have a variety of presentations.  Also possible sedation from his medicine.  Although the medication is the same stuff  he was taking when he was discharged last time.  I am going to cut down on the  Klonopin.  It certainly possible that he was not getting the Klonopin or not the full dose at the group home and so he is not used to what he was ordered.  Also this could just be his schizophrenia.  Patients with schizophrenia can act bizarrely from time to time sometimes unpredictably and for no clear reason.  Certainly appropriate to work-up any medical illness but I suspect this is going to clear up spontaneously.  I did decrease the dose of Klonopin no other change to medicine.  Disposition: No evidence of imminent risk to self or others at present.   Patient does not meet criteria for psychiatric inpatient admission. Supportive therapy provided about ongoing stressors. Discussed crisis plan, support from social network, calling 911, coming to the Emergency Department, and calling Suicide Hotline.  Mordecai RasmussenJohn Beckhem Isadore, MD 03/07/2021 2:21 PM

## 2021-03-07 NOTE — ED Notes (Signed)
Patient attempting to get out of bed. Bed soaked in urine from incontinence. Patient cleaned and brief placed. Helped back in bed

## 2021-03-07 NOTE — ED Notes (Signed)
Dorian RN aware of assigned bed 

## 2021-03-07 NOTE — ED Notes (Signed)
Phlebotomist at bedside.

## 2021-03-07 NOTE — ED Provider Notes (Signed)
-----------------------------------------   12:04 AM on 03/07/2021 -----------------------------------------   Assumed care of patient who was not acting like himself at discharge this afternoon.  MRI was ordered.  Imaging done to evaluate metallic foreign body which was positive.  Radiologist not comfortable performing MRI.  This has been canceled and will instead obtain CTA head and neck.   ----------------------------------------- 12:20 AM on 03/07/2021 -----------------------------------------  Patient attempting to get out of bed.  He is awake and conversive, but not able to be verbally redirected.  Requires IV calming agent.   ----------------------------------------- 2:33 AM on 03/07/2021 -----------------------------------------   CTA head/neck interpreted per Dr. Phill Myron:  1. Negative CTA for large vessel occlusion. No high-grade stenosis  or other acute vascular abnormality.  2. 3 mm focal outpouching arising from the cavernous left ICA,  suspicious for a small paraophthalmic aneurysm.  3. Small layering left pleural effusion.  4. Layering secretions within the carina extending into the proximal  mainstem bronchi, suggesting that this patient is at risk for  aspiration.   CTA unremarkable for acute abnormality; incidental aneurysm noted.  Patient hypoxic without 2 L nasal cannula oxygen.  Given clearing secretions suggesting aspiration, repeat chest x-ray repeated which looks the same.  Have added blood cultures, procalcitonin and after discussion with pharmacist will start IV Unasyn for aspiration pneumonia.  Will discuss with hospital services for admission.  Noted patient's COVID was negative as of 2 days ago.   Irean Hong, MD 03/07/21 [phone]

## 2021-03-07 NOTE — Progress Notes (Signed)
No charge progress note.  Henry Black is a 65 y.o. African-American male with medical history significant for hypertension and hepatitis, liver cirrhosis, undifferentiated schizophrenia and schizoaffective disorder as well as history of cocaine abuse, who presented to the emergency room with acute onset of altered mental status that was initially thought to be related to cocaine and for which the patient was evaluated by psychiatry service and were thought to be stable for discharge to his group home however he was altered from his baseline and was not accepted by his group home. Increased somnolent after getting his psychotropic medications.  Transiently saturation dropped to 88% on room air.  No stable without any oxygen. 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 CT was also concerning for some increased secretions within the carina suggesting of risk of aspiration. UDS was positive for cocaine.  UA unremarkable. Being evaluated by speech therapy-unable to feed himself due to impaired cognition and they were recommending dysphagia 2 diet. Rest of the labs was without any significant abnormality.  Patient appears little tremulous when seen this morning.  He was pretty much nonverbal and only said no when asked about any pain.  He was trying to feed himself and dropping food on clothes because of being little shaky. Rest of the exam was benign.  Psych is also on board.  Worsening of mental status most likely secondary to drug abuse.  We will avoid sedating medications with the hope to go back to his group home tomorrow.

## 2021-03-07 NOTE — ED Notes (Signed)
Patient attempted to climb out of bed. Helped back in bed by this RN and Lawyer. MD messaged
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
## 2021-03-08 ENCOUNTER — Encounter: Payer: Self-pay | Admitting: Family Medicine

## 2021-03-08 MED ORDER — IPRATROPIUM-ALBUTEROL 0.5-2.5 (3) MG/3ML IN SOLN: 3.0000 mL | Freq: Four times a day (QID) | RESPIRATORY_TRACT | Status: DC | PRN

## 2021-03-08 MED ORDER — CLONAZEPAM 0.5 MG PO TABS: 0.5000 mg | ORAL_TABLET | Freq: Two times a day (BID) | ORAL | 0 refills | Status: AC

## 2021-03-08 MED ORDER — BENZTROPINE MESYLATE 1 MG PO TABS
1.0000 mg | ORAL_TABLET | Freq: Two times a day (BID) | ORAL | Status: DC
  Filled 2021-03-08: qty 1

## 2021-03-08 MED ORDER — BENZTROPINE MESYLATE 1 MG PO TABS: 1.0000 mg | ORAL_TABLET | Freq: Two times a day (BID) | ORAL | 0 refills | Status: AC

## 2021-03-08 NOTE — Consult Note (Signed)
[facility] Face-to-Face Psychiatry Consult   Reason for Consult: Follow-up consult for this 65 year old man with schizophrenia who is admitted to the medical service with altered mental status Referring Physician: Nelson Chimes Patient Identification: Henry Black MRN:  [id number] Principal Diagnosis: Schizophrenia, undifferentiated (HCC) Diagnosis:  Principal Problem:   Schizophrenia, undifferentiated (HCC) Active Problems:   Cocaine abuse (HCC)   Aspiration pneumonia (HCC)   Total Time spent with patient: 1 hour  Subjective:   Henry Black is a 65 y.o. male patient admitted with patient not verbal and not replying.  Basically what happened is that we tried to discharge him to his group home from the emergency room but the group home was concerned that his current mental status is not near his baseline and brought him back at which point he was admitted to medical..  HPI: Patient seen chart reviewed.  Patient was admitted to the medical service because of altered mental status with nonfocal findings.  There has been no finding of any specific neurologic or infectious etiology for his altered mental status.  His mental status appears to wax and wane somewhat unpredictably.  This morning apparently when the hospitalist saw him he did open his eyes and shook his head in response to a question.  When I came to see him today he would barely open his eyes and then shut them again.  Would not follow any direct commands and did not speak at all to me.  Since admission his psychiatric medicines had been continued pretty much consistent with what he was taking previously.  500 mg of Depakote twice a day which is actually a fairly low level and had produced a low blood level for him in the past.  Haloperidol 10 mg in the morning and 20 at night which had previously been his dosage.  Haldol Decanoate has not been administered this hospitalization.  I spoke with his group home this morning and confirmed that the last  dosage of that was on February 08, 2021.  Yesterday I decreased his clonazepam from 1 mg twice a day to 1/2 mg twice a day to try to reduce sedation.  Work-up thus far has been unrevealing.  Past Psychiatric History: Patient has a history of schizophrenia or schizoaffective disorder.  Most recent hospitalization psychiatrically was in May.  Recent visit to the emergency room was initially precipitated by him ducking out of the group home to use cocaine.  He does not seem to have regained his usual baseline yet.  Risk to Self:   Risk to Others:   Prior Inpatient Therapy:   Prior Outpatient Therapy:    Past Medical History:  Past Medical History:  Diagnosis Date   Hepatitis    Hypertension     Past Surgical History:  Procedure Laterality Date   ESOPHAGOGASTRODUODENOSCOPY (EGD) WITH PROPOFOL N/A 12/21/2019   Procedure: ESOPHAGOGASTRODUODENOSCOPY (EGD) WITH PROPOFOL;  Surgeon: Toney Reil, MD;  Location: [facility] ENDOSCOPY;  Service: Gastroenterology;  Laterality: N/A;   Family History: History reviewed. No pertinent family history. Family Psychiatric  History: None reported Social History:  Social History   Substance and Sexual Activity  Alcohol Use Not Currently   Alcohol/week: 1.0 standard drink   Types: 1 Standard drinks or equivalent per week     Social History   Substance and Sexual Activity  Drug Use Not Currently   Types: "Crack" cocaine    Social History   Socioeconomic History   Marital status: Single    Spouse name: Not on file  Number of children: Not on file   Years of education: Not on file   Highest education level: Not on file  Occupational History   Not on file  Tobacco Use   Smoking status: Every Day    Packs/day: 0.50    Years: 46.00    Pack years: 23.00    Types: Cigarettes   Smokeless tobacco: Never  Vaping Use   Vaping Use: Never used  Substance and Sexual Activity   Alcohol use: Not Currently    Alcohol/week: 1.0 standard drink    Types: 1  Standard drinks or equivalent per week   Drug use: Not Currently    Types: "Crack" cocaine   Sexual activity: Not on file  Other Topics Concern   Not on file  Social History Narrative   Not on file   Social Determinants of Health   Financial Resource Strain: Not on file  Food Insecurity: Not on file  Transportation Needs: Not on file  Physical Activity: Not on file  Stress: Not on file  Social Connections: Not on file   Additional Social History:    Allergies:   Allergies  Allergen Reactions   Penicillins Rash    Has patient had a PCN reaction causing immediate rash, facial/tongue/throat swelling, SOB or lightheadedness with hypotension: Unknown Has patient had a PCN reaction causing severe rash involving mucus membranes or skin necrosis: Unknown Has patient had a PCN reaction that required hospitalization: Unknown Has patient had a PCN reaction occurring within the last 10 years: Unknown If all of the above answers are "NO", then may proceed with Cephalosporin use.     Labs:  Results for orders placed or performed during the hospital encounter of 03/05/21 (from the past 48 hour(s))  Comprehensive metabolic panel     Status: Abnormal   Collection Time: 03/06/21  5:28 PM  Result Value Ref Range   Sodium 141 135 - 145 mmol/L   Potassium 5.1 3.5 - 5.1 mmol/L   Chloride 104 98 - 111 mmol/L   CO2 32 22 - 32 mmol/L   Glucose, Bld 94 70 - 99 mg/dL    Comment: Glucose reference range applies only to samples taken after fasting for at least 8 hours.   BUN 26 (H) 8 - 23 mg/dL   Creatinine, Ser 4.541.05 0.61 - 1.24 mg/dL   Calcium 9.6 8.9 - 09.810.3 mg/dL   Total Protein 7.3 6.5 - 8.1 g/dL   Albumin 3.7 3.5 - 5.0 g/dL   AST 19 15 - 41 U/L   ALT 16 0 - 44 U/L   Alkaline Phosphatase 47 38 - 126 U/L   Total Bilirubin 0.8 0.3 - 1.2 mg/dL   GFR, Estimated >11>60 >91>60 mL/min    Comment: (NOTE) Calculated using the CKD-EPI Creatinine Equation (2021)    Anion gap 5 5 - 15    Comment:  Performed at [facility], [address] [place], [place] [address]  CBC with Differential     Status: Abnormal   Collection Time: 03/06/21  5:28 PM  Result Value Ref Range   WBC 9.4 4.0 - 10.5 K/uL   RBC 3.89 (L) 4.22 - 5.81 MIL/uL   Hemoglobin 11.8 (L) 13.0 - 17.0 g/dL   HCT 56.236.0 (L) 13.039.0 - 86.552.0 %   MCV 92.5 80.0 - 100.0 fL   MCH 30.3 26.0 - 34.0 pg   MCHC 32.8 30.0 - 36.0 g/dL   RDW 78.413.7 69.611.5 - 29.515.5 %   Platelets 127 (L) 150 -  400 K/uL   nRBC 0.0 0.0 - 0.2 %   Neutrophils Relative % 52 %   Neutro Abs 4.9 1.7 - 7.7 K/uL   Lymphocytes Relative 32 %   Lymphs Abs 3.0 0.7 - 4.0 K/uL   Monocytes Relative 13 %   Monocytes Absolute 1.2 (H) 0.1 - 1.0 K/uL   Eosinophils Relative 3 %   Eosinophils Absolute 0.3 0.0 - 0.5 K/uL   Basophils Relative 0 %   Basophils Absolute 0.0 0.0 - 0.1 K/uL   Immature Granulocytes 0 %   Abs Immature Granulocytes 0.02 0.00 - 0.07 K/uL    Comment: Performed at [facility], [address] [place], [place] [address]  Ammonia     Status: None   Collection Time: 03/06/21  7:44 PM  Result Value Ref Range   Ammonia 13 9 - 35 umol/L    Comment: Performed at [facility], [address] [place], [place] [address]  SARS CORONAVIRUS 2 (TAT 6-24 HRS) Nasopharyngeal Nasopharyngeal Swab     Status: None   Collection Time: 03/07/21  5:44 AM   Specimen: Nasopharyngeal Swab  Result Value Ref Range   SARS Coronavirus 2 NEGATIVE NEGATIVE    Comment: (NOTE) SARS-CoV-2 target nucleic acids are NOT DETECTED.  The SARS-CoV-2 RNA is generally detectable in upper and lower respiratory specimens during the acute phase of infection. Negative results do not preclude SARS-CoV-2 infection, do not rule out co-infections with other pathogens, and should not be used as the sole basis for treatment or other patient management decisions. Negative results must be combined with clinical observations, patient history, and epidemiological information.  The expected result is Negative.  Fact Sheet for Patients: HairSlick.no  Fact Sheet for Healthcare Providers: quierodirigir.com  This test is not yet approved or cleared by the [place] FDA and  has been authorized for detection and/or diagnosis of SARS-CoV-2 by FDA under an Emergency Use Authorization (EUA). This EUA will remain  in effect (meaning this test can be used) for the duration of the COVID-19 declaration under Se ction 564(b)(1) of the Act, 21 U.S.C. section 360bbb-3(b)(1), unless the authorization is terminated or revoked sooner.  Performed at [facility], [address], [place], [place] [address]   Culture, blood (routine x 2)     Status: None (Preliminary result)   Collection Time: 03/07/21  7:21 AM   Specimen: BLOOD  Result Value Ref Range   Specimen Description BLOOD RIGHT AC    Special Requests      BOTTLES DRAWN AEROBIC AND ANAEROBIC Blood Culture adequate volume   Culture      NO GROWTH 1 DAY Performed at [facility], [address] [place], [place] [address]    Report Status PENDING   Procalcitonin - Baseline     Status: None   Collection Time: 03/07/21  7:21 AM  Result Value Ref Range   Procalcitonin <0.10 ng/mL    Comment:        Interpretation: PCT (Procalcitonin) <= 0.5 ng/mL: Systemic infection (sepsis) is not likely. Local bacterial infection is possible. (NOTE)       Sepsis PCT Algorithm           Lower Respiratory Tract                                      Infection PCT Algorithm    ----------------------------     ----------------------------  PCT < 0.25 ng/mL                PCT < 0.10 ng/mL          Strongly encourage             Strongly discourage   discontinuation of antibiotics    initiation of antibiotics    ----------------------------     -----------------------------       PCT 0.25 - 0.50 ng/mL            PCT 0.10 - 0.25 ng/mL                OR       >80% decrease in PCT            Discourage initiation of                                            antibiotics      Encourage discontinuation           of antibiotics    ----------------------------     -----------------------------         PCT >= 0.50 ng/mL              PCT 0.26 - 0.50 ng/mL               AND        <80% decrease in PCT             Encourage initiation of                                             antibiotics       Encourage continuation           of antibiotics    ----------------------------     -----------------------------        PCT >= 0.50 ng/mL                  PCT > 0.50 ng/mL               AND         increase in PCT                  Strongly encourage                                      initiation of antibiotics    Strongly encourage escalation           of antibiotics                                     -----------------------------                                           PCT <= 0.25 ng/mL  OR                                        > 80% decrease in PCT                                      Discontinue / Do not initiate                                             antibiotics  Performed at [facility], [address] [place], [place] [address]   HIV Antibody (routine testing w rflx)     Status: None   Collection Time: 03/07/21  7:21 AM  Result Value Ref Range   HIV Screen 4th Generation wRfx Non Reactive Non Reactive    Comment: Performed at [facility], [address], [place], [place] [address]  Basic metabolic panel     Status: Abnormal   Collection Time: 03/07/21  7:21 AM  Result Value Ref Range   Sodium 137 135 - 145 mmol/L   Potassium 4.6 3.5 - 5.1 mmol/L   Chloride 96 (L) 98 - 111 mmol/L   CO2 32 22 - 32 mmol/L   Glucose, Bld 80 70 - 99 mg/dL    Comment: Glucose reference range applies only to samples taken after fasting for at least 8 hours.   BUN 21 8 - 23  mg/dL   Creatinine, Ser 9.56 0.61 - 1.24 mg/dL   Calcium 9.4 8.9 - 21.3 mg/dL   GFR, Estimated >08 >65 mL/min    Comment: (NOTE) Calculated using the CKD-EPI Creatinine Equation (2021)    Anion gap 9 5 - 15    Comment: Performed at [facility], [address] [place], [place] [address]  CBC     Status: Abnormal   Collection Time: 03/07/21  7:21 AM  Result Value Ref Range   WBC 8.1 4.0 - 10.5 K/uL   RBC 3.90 (L) 4.22 - 5.81 MIL/uL   Hemoglobin 11.6 (L) 13.0 - 17.0 g/dL   HCT 62.9 (L) 52.8 - 41.3 %   MCV 89.5 80.0 - 100.0 fL   MCH 29.7 26.0 - 34.0 pg   MCHC 33.2 30.0 - 36.0 g/dL   RDW 24.4 01.0 - 27.2 %   Platelets 127 (L) 150 - 400 K/uL   nRBC 0.0 0.0 - 0.2 %    Comment: Performed at [facility], [address], [place], [place] [address]  Culture, blood (routine x 2) Call MD if unable to obtain prior to antibiotics being given     Status: None (Preliminary result)   Collection Time: 03/07/21 12:32 PM   Specimen: BLOOD  Result Value Ref Range   Specimen Description BLOOD BLOOD RIGHT ARM    Special Requests      BOTTLES DRAWN AEROBIC ONLY Blood Culture results may not be optimal due to an inadequate volume of blood received in culture bottles   Culture      NO GROWTH < 24 HOURS Performed at [facility], [address], [place], [place] [address]    Report Status PENDING     Current Facility-Administered Medications  Medication Dose Route Frequency Provider  Last Rate Last Admin   0.9 %  sodium chloride infusion   Intravenous Continuous Mansy, Jan A, MD 100 mL/hr at 03/07/21 0706 New Bag at 03/07/21 0706   acetaminophen (TYLENOL) tablet 650 mg  650 mg Oral Q6H PRN Mansy, Jan A, MD       Or   acetaminophen (TYLENOL) suppository 650 mg  650 mg Rectal Q6H PRN Mansy, Jan A, MD       amLODipine (NORVASC) tablet 5 mg  5 mg Oral Daily Mansy, Jan A, MD   5 mg at 03/08/21 0826   atorvastatin (LIPITOR) tablet 20 mg  20 mg Oral Daily Gilles Chiquito, MD    20 mg at 03/08/21 2536   cholecalciferol (VITAMIN D3) tablet 2,000 Units  2,000 Units Oral Daily Mansy, Jan A, MD   2,000 Units at 03/08/21 6440   clonazePAM (KLONOPIN) tablet 0.5 mg  0.5 mg Oral BID Shawni Volkov T, MD   0.5 mg at 03/08/21 0824   divalproex (DEPAKOTE) DR tablet 500 mg  500 mg Oral BID Gilles Chiquito, MD   500 mg at 03/07/21 2300   enoxaparin (LOVENOX) injection 40 mg  40 mg Subcutaneous Q24H Mansy, Jan A, MD   40 mg at 03/08/21 0827   guaiFENesin (MUCINEX) 12 hr tablet 600 mg  600 mg Oral BID Mansy, Jan A, MD   600 mg at 03/08/21 3474   haloperidol (HALDOL) tablet 10 mg  10 mg Oral Daily Jathen Sudano T, MD   10 mg at 03/08/21 2595   haloperidol (HALDOL) tablet 20 mg  20 mg Oral QHS Reegan Mctighe T, MD   20 mg at 03/07/21 2259   ipratropium-albuterol (DUONEB) 0.5-2.5 (3) MG/3ML nebulizer solution 3 mL  3 mL Nebulization Q6H PRN Mansy, Jan A, MD       losartan (COZAAR) tablet 100 mg  100 mg Oral Daily Mansy, Jan A, MD   100 mg at 03/08/21 6387   magnesium hydroxide (MILK OF MAGNESIA) suspension 30 mL  30 mL Oral Daily PRN Mansy, Jan A, MD       ondansetron [facility]) tablet 4 mg  4 mg Oral Q6H PRN Mansy, Jan A, MD       Or   ondansetron [facility]) injection 4 mg  4 mg Intravenous Q6H PRN Mansy, Jan A, MD       traZODone (DESYREL) tablet 25 mg  25 mg Oral QHS PRN Mansy, Jan A, MD   25 mg at 03/07/21 2300    Musculoskeletal: Strength & Muscle Tone: within normal limits Gait & Station:  Not able to assess.  Patient not cooperative Patient leans:  Not able to assess patient not cooperative            Psychiatric Specialty Exam:  Presentation  General Appearance: Casual  Eye Contact:Good  Speech:Normal Rate  Speech Volume:Normal  Handedness:Right   Mood and Affect  Mood:Euthymic  Affect:Congruent   Thought Process  Thought Processes:Goal Directed  Descriptions of Associations:Intact  Orientation:Full (Time, Place and Person)  Thought  Content:Logical  History of Schizophrenia/Schizoaffective disorder:Yes  Duration of Psychotic Symptoms:Greater than six months  Hallucinations:No data recorded Ideas of Reference:None  Suicidal Thoughts:No data recorded Homicidal Thoughts:No data recorded  Sensorium  Memory:Immediate Fair; Recent Fair; Remote Poor  Judgment:Intact  Insight:Present   Executive Functions  Concentration:Fair  Attention Span:Fair  Recall:Fair  Fund of Knowledge:Fair  Language:Fair   Psychomotor Activity  Psychomotor Activity: No data recorded  Assets  Assets:Communication Skills; Desire for Improvement;  Financial Resources/Insurance; Housing; Leisure Time; Resilience; Social Support   Sleep  Sleep: No data recorded  Physical Exam: Physical Exam Vitals and nursing note reviewed.  Constitutional:      Appearance: Normal appearance.  HENT:     Head: Normocephalic and atraumatic.     Mouth/Throat:     Pharynx: Oropharynx is clear.  Eyes:     Pupils: Pupils are equal, round, and reactive to light.  Cardiovascular:     Rate and Rhythm: Normal rate and regular rhythm.  Pulmonary:     Effort: Pulmonary effort is normal.     Breath sounds: Normal breath sounds.  Abdominal:     General: Abdomen is flat.     Palpations: Abdomen is soft.  Musculoskeletal:        General: Normal range of motion.  Skin:    General: Skin is warm and dry.  Neurological:     General: No focal deficit present.     Mental Status: Mental status is at baseline.     Comments: Patient not able to cooperate with performing much of an exam.  His muscle tone probably seems pretty normal.  I could not detect any cogwheeling stiffness.  At some point moving his arms he becomes resistant but this seems to probably be volitional.  There is no obvious sign to suggest a stroke or acute focal deficit.  Speech therapy worked with him and did not find any identifiable neurologic problem with his speech or swallowing  apparatus.  Psychiatric:        Attention and Perception: He is inattentive.        Speech: He is noncommunicative.   Review of Systems  Unable to perform ROS: Patient unresponsive  Blood pressure 105/76, pulse 69, temperature 97.6 F (36.4 C), resp. rate 16, height 6\' 1"  (1.854 m), weight 80.1 kg, SpO2 100 %. Body mass index is 23.3 kg/m.  Treatment Plan Summary: Medication management and Plan patient continues with altered mental status from his baseline.  Waxes and wanes but is essentially mostly unresponsive.  He is eating and has been cooperative with some medication administration.  Differential diagnosis at this point would include possibly over sedation from medication, although as noted above these are the same medicines he was tolerating well recently.  Also could be catatonia or a catatonic like presentation that is directly related to his chronic mental illness.  Remainder of the differential would mostly be other medical causes although a pretty good work-up has been done and there is no clear point or towards another medical illness.  Hospital staff is recommending that he be discharged to his group home because he no longer has an acute medical need.  Under a more perfect situation if there was a medically oriented psychiatric ward we could admit him to that would probably be best but we are not able to manage him with his current presentation on the inpatient psychiatric ward.  If the patient is discharged in his current condition back to his group home there is a good chance that they will bring him back to the emergency room but at least they have the resources to safely deal with that.  I do however have a couple more recommendations.  I would like to decrease his Cogentin.  He certainly is on a lot of Haldol but does not have any signs of EPS and Cogentin itself is a sedating medicine.  If he is being discharged now I would not recommend administering the Haldol  decanoate shot in his  current condition.  If he remains in the hospital psychiatric service will be glad to continue follow-up.  Disposition: No evidence of imminent risk to self or others at present.    Mordecai Rasmussen, MD 03/08/2021 11:40 AM

## 2021-03-08 NOTE — Progress Notes (Signed)
Patient is being discharged back to his Group Home. [facility] spoke with facility. Their transportation will be here to pick up about 2:00 pm. Patient is dressed and ready to go. Discharge instruction in the packet to return to facility

## 2021-03-08 NOTE — [place]2 (Signed)
[place] MEDICAID [place]2 LEVEL OF CARE SCREENING TOOL     IDENTIFICATION  Patient Name: Henry Black Birthdate: 05-05-1956 Sex: male Admission Date (Current Location): 03/05/2021  [place] and [place] Number:  [address] [id number] [address] Facility and Address:  [facility], [address], [place], [place] [address]      Provider Number: [id number]  Attending Physician Name and Address:  Arnetha Courser, MD  Relative Name and Phone Number:  [name] (Other)   [phone] (Mobile)    Current Level of Care: Other (Comment) (family care home/ group home) Recommended Level of Care: Assisted Living Facility, Family Care Home, Other (Comment) (group home- [facility]) Prior Approval Number:    Date Approved/Denied:   PASRR Number:    Discharge Plan: Other (Comment) (Group home)    Current Diagnoses: Patient Active Problem List   Diagnosis Date Noted   Aspiration pneumonia (HCC) 03/07/2021   Cocaine abuse (HCC) 03/05/2021   Essential hypertension 11/27/2020   Schizoaffective disorder (HCC) 11/26/2020   Schizophrenia, undifferentiated (HCC) 11/26/2020   Chronic hepatitis C (HCC) 03/29/2020   Hepatic cirrhosis (HCC) 03/29/2020   Aggressive behavior 09/17/2018    Orientation RESPIRATION BLADDER Height & Weight     Self  Normal Continent Weight: 80.1 kg Height:  6\' 1"  (185.4 cm)  BEHAVIORAL SYMPTOMS/MOOD NEUROLOGICAL BOWEL NUTRITION STATUS     (withdrawn) Continent Diet  AMBULATORY STATUS COMMUNICATION OF NEEDS Skin   Supervision Verbally Normal                       Personal Care Assistance Level of Assistance  Bathing, Feeding, Dressing Bathing Assistance: Independent Feeding assistance: Independent Dressing Assistance: Limited assistance Total Care Assistance: Limited assistance   Functional Limitations Info  Sight, Speech, Hearing Sight Info: Adequate Hearing Info: Adequate Speech Info: Adequate    SPECIAL CARE FACTORS FREQUENCY                        Contractures Contractures Info: Not present    Additional Factors Info  Code Status, Allergies Code Status Info: Full Allergies Info: PCN           Current Medications (03/08/2021):  This is the current hospital active medication list Current Facility-Administered Medications  Medication Dose Route Frequency Provider Last Rate Last Admin   0.9 %  sodium chloride infusion   Intravenous Continuous Mansy, Jan A, MD   Stopped at 03/08/21 1259   acetaminophen (TYLENOL) tablet 650 mg  650 mg Oral Q6H PRN Mansy, Jan A, MD       Or   acetaminophen (TYLENOL) suppository 650 mg  650 mg Rectal Q6H PRN Mansy, Jan A, MD       amLODipine (NORVASC) tablet 5 mg  5 mg Oral Daily Mansy, Jan A, MD   5 mg at 03/08/21 05/08/21   atorvastatin (LIPITOR) tablet 20 mg  20 mg Oral Daily [id number], MD   20 mg at 03/08/21 05/08/21   benztropine (COGENTIN) tablet 1 mg  1 mg Oral BID Clapacs, [id number], MD       cholecalciferol (VITAMIN D3) tablet 2,000 Units  2,000 Units Oral Daily Mansy, Jan A, MD   2,000 Units at 03/08/21 05/08/21   clonazePAM (KLONOPIN) tablet 0.5 mg  0.5 mg Oral BID Clapacs, [name], MD   0.5 mg at 03/08/21 0824   divalproex (DEPAKOTE) DR tablet 500 mg  500 mg Oral BID 05/08/21, MD   500 mg at  03/08/21 1259   enoxaparin (LOVENOX) injection 40 mg  40 mg Subcutaneous Q24H Mansy, Jan A, MD   40 mg at 03/08/21 0827   guaiFENesin (MUCINEX) 12 hr tablet 600 mg  600 mg Oral BID Mansy, Jan A, MD   600 mg at 03/08/21 6237   haloperidol (HALDOL) tablet 10 mg  10 mg Oral Daily Clapacs, [name], MD   10 mg at 03/08/21 6283   haloperidol (HALDOL) tablet 20 mg  20 mg Oral QHS Clapacs, [name], MD   20 mg at 03/07/21 2259   ipratropium-albuterol (DUONEB) 0.5-2.5 (3) MG/3ML nebulizer solution 3 mL  3 mL Nebulization Q6H PRN Mansy, Jan A, MD       losartan (COZAAR) tablet 100 mg  100 mg Oral Daily Mansy, Jan A, MD   100 mg at 03/08/21 1517   magnesium hydroxide (MILK OF MAGNESIA)  suspension 30 mL  30 mL Oral Daily PRN Mansy, Jan A, MD       ondansetron [facility]) tablet 4 mg  4 mg Oral Q6H PRN Mansy, Jan A, MD       Or   ondansetron [facility]) injection 4 mg  4 mg Intravenous Q6H PRN Mansy, Jan A, MD       traZODone (DESYREL) tablet 25 mg  25 mg Oral QHS PRN Mansy, Jan A, MD   25 mg at 03/07/21 2300     Discharge Medications: Medication List       STOP taking these medications     haloperidol decanoate 100 MG/ML injection Commonly known as: HALDOL DECANOATE    Ingrezza 40 MG capsule Generic drug: valbenazine           TAKE these medications     amLODipine 5 MG tablet Commonly known as: NORVASC Take 1 tablet (5 mg total) by mouth daily.    atorvastatin 20 MG tablet Commonly known as: LIPITOR Take 20 mg by mouth daily.    benztropine 1 MG tablet Commonly known as: COGENTIN Take 1 tablet (1 mg total) by mouth 2 (two) times daily. What changed: how much to take    clonazePAM 0.5 MG tablet Commonly known as: KLONOPIN Take 1 tablet (0.5 mg total) by mouth 2 (two) times daily. What changed:  medication strength how much to take    divalproex 500 MG DR tablet Commonly known as: DEPAKOTE Take 1 tablet (500 mg total) by mouth 2 (two) times daily.    haloperidol 10 MG tablet Commonly known as: HALDOL Take 1 tablet (10 mg total) by mouth in the morning AND 2 tablets (20 mg total) at bedtime.    losartan 100 MG tablet Commonly known as: COZAAR Take 100 mg by mouth daily.        Relevant Imaging Results:  Relevant Lab Results:   Additional Information SS# [id number]  Allayne Butcher, RN

## 2021-03-08 NOTE — Discharge Summary (Signed)
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 or cleared by the [place] FDA and has been authorized for detection and/or diagnosis of SARS-CoV-2 by FDA under an Emergency Use Authorization (EUA). This EUA will remain in effect (meaning this test can be used) for the duration of the COVID-19 declaration under Section 564(b)(1) of the Act, 21 U.S.C. section 360bbb-3(b)(1), unless the authorization is terminated or revoked.  Performed at [facility], [address] [place], [place] [address]   SARS CORONAVIRUS 2 (TAT 6-24 HRS) Nasopharyngeal Nasopharyngeal Swab     Status: None   Collection Time: 03/07/21  5:44 AM   Specimen: Nasopharyngeal Swab  Result Value Ref Range Status   SARS Coronavirus 2 NEGATIVE NEGATIVE Final    Comment: (NOTE) SARS-CoV-2 target nucleic acids are NOT DETECTED.  The SARS-CoV-2 RNA is generally detectable in upper and lower respiratory specimens during the acute phase of infection. Negative results do not  preclude SARS-CoV-2 infection, do not rule out co-infections with other pathogens, and should not be used as the sole basis for treatment or other patient management decisions. Negative results must be combined with clinical observations, patient history, and epidemiological information. The expected result is Negative.  Fact Sheet for Patients: HairSlick.no  Fact Sheet for Healthcare Providers: quierodirigir.com  This test is not yet approved or cleared by the [place] FDA and  has been authorized for detection and/or diagnosis of SARS-CoV-2 by FDA under an Emergency Use Authorization (EUA). This EUA will remain  in effect (meaning this test can be used) for the duration of the COVID-19 declaration under Se ction 564(b)(1) of the Act, 21 U.S.C. section 360bbb-3(b)(1), unless the authorization is terminated or revoked sooner.  Performed at [facility], [address] [place], [place] [address]   Culture, blood (routine x 2)     Status: None (Preliminary result)   Collection Time: 03/07/21  7:21 AM   Specimen: BLOOD  Result Value Ref Range Status   Specimen Description BLOOD RIGHT [facility]  Final   Special Requests   Final    BOTTLES DRAWN AEROBIC AND ANAEROBIC Blood Culture adequate volume   Culture   Final    NO GROWTH 1 DAY Performed at [facility], [address] [place], [place] [address]    Report Status PENDING  Incomplete   Culture, blood (routine x 2) Call MD if unable to obtain prior to antibiotics being given     Status: None (Preliminary result)   Collection Time: 03/07/21 12:32 PM   Specimen: BLOOD  Result Value Ref Range Status   Specimen Description BLOOD BLOOD RIGHT ARM  Final   Special Requests   Final    BOTTLES DRAWN AEROBIC ONLY Blood Culture results may not be optimal due to an inadequate volume of blood received in culture bottles   Culture   Final    NO GROWTH < 24 HOURS Performed at [facility], [address], [place], [place] [address]    Report Status PENDING  Incomplete    Time coordinating discharge: Over 30 minutes  SIGNED:  Arnetha Courser, MD  [facility] 03/08/2021, 12:28 PM  If 7PM-7AM, please contact night-coverage [email or web address]  This record has been created using Conservation officer, historic buildings. Errors have been sought and corrected,but may not always be located. Such creation errors do not reflect on the standard of care.

## 2021-03-08 NOTE — Plan of Care (Signed)
  Problem: Clinical Measurements: Goal: Ability to maintain clinical measurements within normal limits will improve Outcome: Progressing   Problem: Clinical Measurements: Goal: Diagnostic test results will improve Outcome: Progressing   Problem: Clinical Measurements: Goal: Respiratory complications will improve Outcome: Progressing   

## 2021-03-08 NOTE — TOC Transition Note (Signed)
Transition of Care [facility]) - CM/SW Discharge Note   Patient Details  Name: Henry Black MRN: [id number] Date of Birth: 1955/09/24  Transition of [facility] CM/SW Contact:  Allayne Butcher, RN Phone Number: 03/08/2021, 12:59 PM   Clinical Narrative:    Per hospitalist patient is medically cleared for discharge back to group home.  Dr. Toni Amend has been following during hospital admission and although patient is not back to his baseline mentation he does not meet criteria for inpatient admission to the [facility].   Group home notified of discharge and they will be here to pick up patient at 2pm.     Final next level of care: Group Home Barriers to Discharge: Barriers Resolved   Patient Goals and CMS Choice Patient states their goals for this hospitalization and ongoing recovery are:: Patient is unable to state goals at this time      Discharge Placement                       Discharge Plan and Services   Discharge Planning Services: CM Consult            DME Arranged: N/A DME Agency: NA       HH Arranged: NA HH Agency: NA        Social Determinants of Health (SDOH) Interventions     Readmission Risk Interventions No flowsheet data found.

## 2021-03-08 NOTE — Progress Notes (Signed)
SLP Cancellation Note  Patient Details Name: Henry Black MRN: [id number] DOB: 07-22-1956   Cancelled treatment:       Reason Eval/Treat Not Completed: Patient's level of consciousness  Pt didn't arouse enough for safe PO intake. He was not responsive to tactile or verbal cues.   Nursing reports that pt has been consuming currently ordered diet with "no problem." Further diet advancement can be provided at next venue of care with familiar foods and environment.   At this time, ST intervention doesn't appear indicated. ST to sign off.   Marise Knapper B. Dreama Saa M.S., CCC-SLP, [facility] Speech-Language Pathologist Rehabilitation Services Office [phone]  Reuel Derby 03/08/2021, 11:37 AM

## 2021-03-09 ENCOUNTER — Emergency Department: Payer: Medicare Other

## 2021-03-09 ENCOUNTER — Other Ambulatory Visit: Payer: Self-pay

## 2021-03-09 ENCOUNTER — Emergency Department (EMERGENCY_DEPARTMENT_HOSPITAL)
Admission: EM | Admit: 2021-03-09 | Discharge: 2021-03-13 | Disposition: A | Discharge status: Home / Self Care | Payer: Medicare Other | Source: Home / Self Care | Attending: Emergency Medicine | Admitting: Emergency Medicine

## 2021-03-09 DIAGNOSIS — F17210 Nicotine dependence, cigarettes, uncomplicated: Secondary | ICD-10-CM | POA: Insufficient documentation

## 2021-03-09 DIAGNOSIS — U071 COVID-19: Secondary | ICD-10-CM | POA: Insufficient documentation

## 2021-03-09 DIAGNOSIS — Z79899 Other long term (current) drug therapy: Secondary | ICD-10-CM | POA: Insufficient documentation

## 2021-03-09 DIAGNOSIS — I10 Essential (primary) hypertension: Secondary | ICD-10-CM | POA: Insufficient documentation

## 2021-03-09 DIAGNOSIS — F1410 Cocaine abuse, uncomplicated: Secondary | ICD-10-CM | POA: Diagnosis present

## 2021-03-09 DIAGNOSIS — F209 Schizophrenia, unspecified: Secondary | ICD-10-CM

## 2021-03-09 DIAGNOSIS — F203 Undifferentiated schizophrenia: Secondary | ICD-10-CM | POA: Insufficient documentation

## 2021-03-09 DIAGNOSIS — F259 Schizoaffective disorder, unspecified: Secondary | ICD-10-CM | POA: Diagnosis present

## 2021-03-09 LAB — COMPREHENSIVE METABOLIC PANEL
ALT: 14 U/L (ref 0–44)
AST: 17 U/L (ref 15–41)
Albumin: 3.5 g/dL (ref 3.5–5.0)
Alkaline Phosphatase: 46 U/L (ref 38–126)
Anion gap: 7 (ref 5–15)
BUN: 17 mg/dL (ref 8–23)
CO2: 27 mmol/L (ref 22–32)
Calcium: 9.3 mg/dL (ref 8.9–10.3)
Chloride: 100 mmol/L (ref 98–111)
Creatinine, Ser: 0.95 mg/dL (ref 0.61–1.24)
GFR, Estimated: >60 mL/min (ref >60)
Glucose, Bld: 90 mg/dL (ref 70–99)
Potassium: 4.2 mmol/L (ref 3.5–5.1)
Sodium: 134 mmol/L — ABNORMAL LOW (ref 135–145)
Total Bilirubin: 0.7 mg/dL (ref 0.3–1.2)
Total Protein: 7 g/dL (ref 6.5–8.1)

## 2021-03-09 LAB — URINALYSIS, COMPLETE (UACMP) WITH MICROSCOPIC
Bacteria, UA: NONE SEEN
Bilirubin Urine: NEGATIVE
Glucose, UA: NEGATIVE mg/dL
Ketones, ur: 20 mg/dL — AB
Nitrite: NEGATIVE
Protein, ur: NEGATIVE mg/dL
Specific Gravity, Urine: 1.018 (ref 1.005–1.030)
pH: 6 (ref 5.0–8.0)

## 2021-03-09 LAB — URINE DRUG SCREEN, QUALITATIVE (ARMC ONLY)
Amphetamines, Ur Screen: NOT DETECTED
Barbiturates, Ur Screen: NOT DETECTED
Benzodiazepine, Ur Scrn: POSITIVE — AB
Cannabinoid 50 Ng, Ur ~~LOC~~: NOT DETECTED
Cocaine Metabolite,Ur ~~LOC~~: NOT DETECTED
MDMA (Ecstasy)Ur Screen: NOT DETECTED
Methadone Scn, Ur: NOT DETECTED
Opiate, Ur Screen: NOT DETECTED
Phencyclidine (PCP) Ur S: NOT DETECTED
Tricyclic, Ur Screen: NOT DETECTED

## 2021-03-09 LAB — AMMONIA: Ammonia: 23 umol/L (ref 9–35)

## 2021-03-09 LAB — RESP PANEL BY RT-PCR (FLU A&B, COVID) ARPGX2
Influenza A by PCR: NEGATIVE
Influenza B by PCR: NEGATIVE
SARS Coronavirus 2 by RT PCR: POSITIVE — AB

## 2021-03-09 LAB — ETHANOL: Alcohol, Ethyl (B): <10 mg/dL (ref <10)

## 2021-03-09 MED ORDER — BENZTROPINE MESYLATE 1 MG PO TABS
1.0000 mg | ORAL_TABLET | Freq: Two times a day (BID) | ORAL | Status: DC
  Administered 2021-03-09 – 2021-03-13 (×9): 1 mg via ORAL
  Filled 2021-03-09 (×9): qty 1

## 2021-03-09 MED ORDER — DIVALPROEX SODIUM 500 MG PO DR TAB
500.0000 mg | DELAYED_RELEASE_TABLET | Freq: Two times a day (BID) | ORAL | Status: DC
  Administered 2021-03-09 – 2021-03-13 (×9): 500 mg via ORAL
  Filled 2021-03-09 (×9): qty 1

## 2021-03-09 MED ORDER — CLONAZEPAM 0.5 MG PO TABS
0.5000 mg | ORAL_TABLET | Freq: Two times a day (BID) | ORAL | Status: DC
  Administered 2021-03-09 – 2021-03-13 (×9): 0.5 mg via ORAL
  Filled 2021-03-09 (×9): qty 1

## 2021-03-09 MED ORDER — ATORVASTATIN CALCIUM 20 MG PO TABS: 20.0000 mg | ORAL_TABLET | Freq: Every day | ORAL | Status: DC

## 2021-03-09 MED ORDER — HALOPERIDOL 5 MG PO TABS
20.0000 mg | ORAL_TABLET | Freq: Every day | ORAL | Status: DC
  Administered 2021-03-09 – 2021-03-12 (×4): 20 mg via ORAL
  Filled 2021-03-09 (×4): qty 4

## 2021-03-09 MED ORDER — HALOPERIDOL 5 MG PO TABS
10.0000 mg | ORAL_TABLET | Freq: Every day | ORAL | Status: DC
  Administered 2021-03-09 – 2021-03-13 (×5): 10 mg via ORAL
  Filled 2021-03-09 (×7): qty 2

## 2021-03-09 MED ORDER — AMLODIPINE BESYLATE 5 MG PO TABS
5.0000 mg | ORAL_TABLET | Freq: Every day | ORAL | Status: DC
  Administered 2021-03-09 – 2021-03-13 (×5): 5 mg via ORAL
  Filled 2021-03-09 (×5): qty 1

## 2021-03-09 MED ORDER — NIRMATRELVIR/RITONAVIR (PAXLOVID)TABLET
3.0000 | ORAL_TABLET | Freq: Two times a day (BID) | ORAL | Status: DC
Start: 2021-03-09 — End: 2021-03-13
  Administered 2021-03-09 – 2021-03-13 (×7): 3 via ORAL
  Filled 2021-03-09 (×3): qty 30

## 2021-03-09 NOTE — ED Notes (Signed)
xr at bedside

## 2021-03-09 NOTE — ED Notes (Signed)
ED Provider at bedside. 

## 2021-03-09 NOTE — ED Notes (Signed)
Pt to decon room at this time due to multiple bedbugs present upon arrival to room 03.

## 2021-03-09 NOTE — ED Notes (Signed)
Pt resting comfortably at this time. Pt given breakfast tray and offered to warm food up, pt denies at this time. NAD noted. Pt denies any further needs. Pt educated on use of call bell.

## 2021-03-09 NOTE — ED Notes (Addendum)
Awakened pt, shuffled to bathroom with assistance, brief changed d/t urine incontinence, sacral pressure dressing dry and intact, assisted back to bed. Pt needed frequent stimulation to eat 25% Hydrographic surveyor fed pt) of dinner tray and drink juice (including reminders to chew and drink through straw). Pt speaks in 1 words if any words at all but they are appropriate to what is asked, follows all commands. Cardiac monitoring placed back on as pt self removed. Bed alarm back on.

## 2021-03-09 NOTE — Consult Note (Signed)
Patient is nonverbal on assessment with no communication, no nodding of the head or other movements.  He was discharged from the medical floor in a similar state and returned 5 hours later with continuation of AMS.  His group home was called with no answer and "voicemail full", unable to leave a message.  This provider will continue to call to get collateral information as the client is not able to provide any information at this time.  Potential for catatonia, will continue to reach the group home and monitor the client.  Nanine Means, PMHNP

## 2021-03-09 NOTE — ED Notes (Signed)
Pt refusing to let this RN take vitals

## 2021-03-09 NOTE — ED Notes (Addendum)
Pt given dinner tray and encouraged to eat. Pt will wake up to light voice and name. Pt did fall back asleep.   Pt still refusing vitals and states "when I am fully awake and that's not now"

## 2021-03-09 NOTE — BH Assessment (Addendum)
Writer spoke with [name] [phone] (Group Home Owner) who reported that she suspects pt consumed bad drugs (Cocaine) based on a conversation with a local. Leta Jungling explained that pt was discharged but was not fully stabilized earlier in the week. Leta Jungling reported that the pt came home yesterday, refusing to keep on his underwear and pants. Leta Jungling explained that the pt continued to walk around bottomless and eventually passed out. Staff explained that the pt woke up and passed out yet again which prompted them to call the ambulance. Leta Jungling reported that the pt had had a similar episode prior to this incident; however it was not as severe as this time. Leta Jungling requested updates on the pt's status going forward.

## 2021-03-09 NOTE — ED Notes (Signed)
Pt given lunch tray. Pt urinated in bed, bed cleaned and new gown and linen replaced. Pt took own IV out and when asked why pt does not make sense. Pt denies any further needs. Pt educated on call bell in reach, pt verbalized understanding.

## 2021-03-09 NOTE — ED Provider Notes (Signed)
[facility] Emergency Department Provider Note  ____________________________________________  Time seen: Approximately 5:10 AM  I have reviewed the triage vital signs and the nursing notes.   HISTORY  Chief Complaint No chief complaint on file.  Level 5 caveat:  Portions of the history and physical were unable to be obtained due to AMS   HPI Henry Black is a 65 y.o. male with history of cirrhosis of the liver, hepatitis, schizophrenia, hypertension, cocaine abuse who presents for evaluation of altered mental status.  Patient was discharged from the hospital yesterday after being admitted for altered mental status.  Review of discharge instructions showed that patient's presentation was most likely due to his psychiatric illness.  However there was an incidental finding of a 3 mm ICA aneurysm with no signs of bleed.  According to EMS patient again became altered this evening.  He was found covered in emesis and not responding.  Upon arrival to the emergency room patient sitting up with his eyes open.  Will not establish eye contact, not answer any questions, not following commands Past Medical History:  Diagnosis Date   Hepatitis    Hypertension     Patient Active Problem List   Diagnosis Date Noted   Aspiration pneumonia (HCC) 03/07/2021   Cocaine abuse (HCC) 03/05/2021   Essential hypertension 11/27/2020   Schizoaffective disorder (HCC) 11/26/2020   Schizophrenia, undifferentiated (HCC) 11/26/2020   Chronic hepatitis C (HCC) 03/29/2020   Hepatic cirrhosis (HCC) 03/29/2020   Aggressive behavior 09/17/2018    Past Surgical History:  Procedure Laterality Date   ESOPHAGOGASTRODUODENOSCOPY (EGD) WITH PROPOFOL N/A 12/21/2019   Procedure: ESOPHAGOGASTRODUODENOSCOPY (EGD) WITH PROPOFOL;  Surgeon: Toney ReilVanga, Rohini Reddy, MD;  Location: [facility] ENDOSCOPY;  Service: Gastroenterology;  Laterality: N/A;    Prior to Admission medications   Medication Sig Start  Date End Date Taking? Authorizing Provider  amLODipine (NORVASC) 5 MG tablet Take 1 tablet (5 mg total) by mouth daily. 12/11/20   Jesse SansFreeman, Megan M, MD  atorvastatin (LIPITOR) 20 MG tablet Take 20 mg by mouth daily.    [provider]  benztropine (COGENTIN) 1 MG tablet Take 1 tablet (1 mg total) by mouth 2 (two) times daily. 03/08/21   Arnetha CourserAmin, Sumayya, MD  Cholecalciferol 2000 units CAPS Take 2,000 Units by mouth daily. Patient not taking: Reported on 03/07/2021    [provider]  clonazePAM (KLONOPIN) 0.5 MG tablet Take 1 tablet (0.5 mg total) by mouth 2 (two) times daily. 03/08/21   Arnetha CourserAmin, Sumayya, MD  divalproex (DEPAKOTE) 500 MG DR tablet Take 1 tablet (500 mg total) by mouth 2 (two) times daily. 12/10/20   Jesse SansFreeman, Megan M, MD  haloperidol (HALDOL) 10 MG tablet Take 1 tablet (10 mg total) by mouth in the morning AND 2 tablets (20 mg total) at bedtime. 12/10/20   Jesse SansFreeman, Megan M, MD  losartan (COZAAR) 100 MG tablet Take 100 mg by mouth daily.    [provider]    Allergies Penicillins  No family history on file.  Social History Social History   Tobacco Use   Smoking status: Every Day    Packs/day: 0.50    Years: 46.00    Pack years: 23.00    Types: Cigarettes   Smokeless tobacco: Never  Vaping Use   Vaping Use: Never used  Substance Use Topics   Alcohol use: Not Currently    Alcohol/week: 1.0 standard drink    Types: 1 Standard drinks or equivalent per week   Drug use: Not  Currently    Types: "Crack" cocaine    Review of Systems  Constitutional: Negative for fever. Gastrointestinal: + vomiting Psych: + AMS  Level 5 caveat:  Portions of the history and physical were unable to be obtained due to AMS   ____________________________________________   PHYSICAL EXAM:  VITAL SIGNS: ED Triage Vitals  Enc Vitals Group     BP 03/09/21 0315 (!) 143/83     Pulse Rate 03/09/21 0315 77     Resp 03/09/21 0315 18     Temp 03/09/21 0315 98.3 F (36.8  C)     Temp Source 03/09/21 0315 Oral     SpO2 03/09/21 0315 98 %     Weight 03/09/21 0318 172 lb 3.2 oz (78.1 kg)     Height --      Head Circumference --      Peak Flow --      Pain Score --      Pain Loc --      Pain Edu? --      Excl. in GC? --     Constitutional: Alert, makes eye contact, responds to noxious stimuli, not answering questions or following commands, not in distress. Dry emesis on beard. Patient is covered in bed bugs HEENT:      Head: Normocephalic and atraumatic.         Eyes: Conjunctivae are normal. Sclera is non-icteric.       Mouth/Throat: Mucous membranes are moist.       Neck: Supple with no signs of meningismus. Cardiovascular: Regular rate and rhythm. No murmurs, gallops, or rubs. 2+ symmetrical distal pulses are present in all extremities. No JVD. Respiratory: Normal respiratory effort. Lungs are clear to auscultation bilaterally.  Gastrointestinal: Soft, non tender, and non distended with positive bowel sounds. No rebound or guarding. Musculoskeletal:  No edema, cyanosis, or erythema of extremities. Neurologic: Face is symmetric. Moving all extremities.  Skin: Skin is warm, dry and intact. No rash noted.   ____________________________________________   LABS (all labs ordered are listed, but only abnormal results are displayed)  Labs Reviewed  RESP PANEL BY RT-PCR (FLU A&B, COVID) ARPGX2 - Abnormal; Notable for the following components:      Result Value   SARS Coronavirus 2 by RT PCR POSITIVE (*)    All other components within normal limits  COMPREHENSIVE METABOLIC PANEL - Abnormal; Notable for the following components:   Sodium 134 (*)    All other components within normal limits  URINALYSIS, COMPLETE (UACMP) WITH MICROSCOPIC - Abnormal; Notable for the following components:   Color, Urine YELLOW (*)    APPearance CLEAR (*)    Hgb urine dipstick SMALL (*)    Ketones, ur 20 (*)    Leukocytes,Ua TRACE (*)    All other components within  normal limits  URINE DRUG SCREEN, QUALITATIVE ([facility] ONLY) - Abnormal; Notable for the following components:   Benzodiazepine, Ur Scrn POSITIVE (*)    All other components within normal limits  AMMONIA  ETHANOL  CBC WITH DIFFERENTIAL/PLATELET  CBC WITH DIFFERENTIAL/PLATELET   ____________________________________________  EKG  ED ECG REPORT I, Nita Sickle, the attending physician, personally viewed and interpreted this ECG.  [place] rhythm with rate of 75, normal intervals, normal axis, no ST elevations or depressions. ____________________________________________  RADIOLOGY  I have personally reviewed the images performed during this visit and I agree with the Radiologist's read.   Interpretation by Radiologist:  CT HEAD WO CONTRAST ( )  Result Date: 03/09/2021 CLINICAL DATA:  Mental status change. EXAM: CT HEAD WITHOUT CONTRAST TECHNIQUE: Contiguous axial images were obtained from the base of the skull through the vertex without intravenous contrast. COMPARISON:  None. FINDINGS: Brain: No acute intracranial hemorrhage. No focal mass lesion. No CT evidence of acute infarction. No midline shift or mass effect. No hydrocephalus. Basilar cisterns are patent. Vascular: No hyperdense vessel or unexpected calcification. Skull: Normal. Negative for fracture or focal lesion. Sinuses/Orbits: Paranasal sinuses and mastoid air cells are clear. Orbits are clear. Other: None. IMPRESSION: No acute intracranial findings Electronically Signed   By: Genevive Bi M.D.   On: 03/09/2021 06:00   DG Chest Portable 1 View  Result Date: 03/09/2021 CLINICAL DATA:  Altered mental status. EXAM: PORTABLE CHEST 1 VIEW COMPARISON:  March 07, 2021 FINDINGS: Mild areas of atelectasis and/or infiltrate are again seen within the bilateral lung bases, left greater than right. There is no evidence of a pleural effusion or pneumothorax. The heart size and mediastinal contours are within normal limits. The  visualized skeletal structures are unremarkable. IMPRESSION: Mild, stable bibasilar atelectasis and/or infiltrate. Electronically Signed   By: Aram Candela M.D.   On: 03/09/2021 05:08     ____________________________________________   PROCEDURES  Procedure(s) performed:yes .1-3 Lead EKG Interpretation  Date/Time: 03/09/2021 5:40 AM Performed by: Nita Sickle, MD Authorized by: Nita Sickle, MD     Interpretation: non-specific     ECG rate assessment: normal     Rhythm: sinus rhythm     Ectopy: none     Conduction: normal     Critical Care performed:  None ____________________________________________   INITIAL IMPRESSION / ASSESSMENT AND PLAN / ED COURSE  65 y.o. male with history of cirrhosis of the liver, hepatitis, schizophrenia, hypertension, cocaine abuse who presents for evaluation of altered mental status and vomiting.  Patient opens his eyes when his name is called and establishes eye contact but will not follow any commands.  He does voluntarily moves all 4 extremities, his face is symmetric.  Not answering any questions.  Patient discharged from the hospital yesterday for similar presentation with a negative medical work-up.  Based on discharge papers it was felt the patient's symptoms were due to his mental health issues.  Patient was found covered in emesis at his group home and again not responding normally.  He is covered in bedbugs.  He has normal vital signs, he has normal work of breathing and normal sats.  EKG unchanged from baseline.  Chest x-ray visualized by me unchanged from baseline.  Patient is COVID-positive after being discharged from the hospital yesterday.  Has no oxygen requirement or respiratory distress.  Based on his comorbidities patient does qualify for Paxlovid which will be initiated in the emergency room.  CT head was done since patient has a known aneurysm but that is negative for any acute findings, visualized by me confirmed by  radiology.  No significant electrolyte derangements, dehydration.  Ammonia is normal.  UA is negative for urinary tract infection.  UDS showing benzos.  Alcohol is negative.  At this time I believe a lot of patient's symptoms are due to his schizophrenia possibly exacerbated by not feeling well due to COVID-19.  There is no indication for medical admission at this time.  We will consult psychiatry.  Patient placed on telemetry for close monitoring of cardiorespiratory status.  Old medical records reviewed including discharge summary from yesterday  The patient has been placed in psychiatric observation due to the need to provide a safe environment for  the patient while obtaining psychiatric consultation and evaluation, as well as ongoing medical and medication management to treat the patient's condition.  The patient has not been placed under full IVC at this time.      _____________________________________________ Please note:  Patient was evaluated in Emergency Department today for the symptoms described in the history of present illness. Patient was evaluated in the context of the global COVID-19 pandemic, which necessitated consideration that the patient might be at risk for infection with the SARS-CoV-2 virus that causes COVID-19. Institutional protocols and algorithms that pertain to the evaluation of patients at risk for COVID-19 are in a state of rapid change based on information released by regulatory bodies including the CDC and federal and state organizations. These policies and algorithms were followed during the patient's care in the ED.  Some ED evaluations and interventions may be delayed as a result of limited staffing during the pandemic.   [place] Controlled Substance Database was reviewed by me. ____________________________________________   FINAL CLINICAL IMPRESSION(S) / ED DIAGNOSES   Final diagnoses:  COVID-19  Schizophrenia, unspecified type (HCC)      NEW MEDICATIONS  STARTED DURING THIS VISIT:  ED Discharge Orders     None        Note:  This document was prepared using Dragon voice recognition software and may include unintentional dictation errors.    Don Perking, [place], MD 03/09/21 [phone]

## 2021-03-09 NOTE — ED Notes (Signed)
Pt resting comfortably at this time. NAD noted. Normal rise and fall of chest. Call bell in reach.  

## 2021-03-09 NOTE — ED Notes (Signed)
Bed alarm in place for patient.

## 2021-03-09 NOTE — ED Notes (Signed)
Patient transported to CT 

## 2021-03-09 NOTE — ED Notes (Signed)
Pt laying on stretcher with eyes closed, easy, unlabored respirations

## 2021-03-09 NOTE — ED Notes (Signed)
Report to Jacquelyn, RN 

## 2021-03-10 LAB — CBC WITH DIFFERENTIAL/PLATELET
Abs Immature Granulocytes: 0.02 10*3/uL (ref 0.00–0.07)
Basophils Absolute: 0 10*3/uL (ref 0.0–0.1)
Basophils Relative: 1 %
Eosinophils Absolute: 0 10*3/uL (ref 0.0–0.5)
Eosinophils Relative: 1 %
HCT: 34.7 % — ABNORMAL LOW (ref 39.0–52.0)
Hemoglobin: 11.3 g/dL — ABNORMAL LOW (ref 13.0–17.0)
Immature Granulocytes: 0 %
Lymphocytes Relative: 21 %
Lymphs Abs: 1.4 10*3/uL (ref 0.7–4.0)
MCH: 30.1 pg (ref 26.0–34.0)
MCHC: 32.6 g/dL (ref 30.0–36.0)
MCV: 92.3 fL (ref 80.0–100.0)
Monocytes Absolute: 0.6 10*3/uL (ref 0.1–1.0)
Monocytes Relative: 10 %
Neutro Abs: 4.5 10*3/uL (ref 1.7–7.7)
Neutrophils Relative %: 67 %
Platelets: 153 10*3/uL (ref 150–400)
RBC: 3.76 MIL/uL — ABNORMAL LOW (ref 4.22–5.81)
RDW: 13.2 % (ref 11.5–15.5)
WBC: 6.6 10*3/uL (ref 4.0–10.5)
nRBC: 0 % (ref 0.0–0.2)

## 2021-03-10 NOTE — ED Notes (Signed)
Pt provided meal tray at this time and is self feeding appropriately.

## 2021-03-10 NOTE — ED Notes (Signed)
Pt alert and sitting up. Able to feed a cup of applesauce with meds.

## 2021-03-10 NOTE — ED Notes (Signed)
Pt placed on male purewic at this time

## 2021-03-10 NOTE — ED Provider Notes (Signed)
Emergency Medicine Observation Re-evaluation Note  Henry Black is a 65 y.o. male, seen on rounds today.  Pt initially presented to the ED for complaints of No chief complaint on file. Currently, the patient is resting comfortably.  Physical Exam  BP 121/80   Pulse 75   Temp 98.9 F (37.2 C) (Axillary)   Resp 15   Wt 78.1 kg   SpO2 98%   BMI 22.72 kg/m  Physical Exam Gen: No acute distress  Resp: Normal rise and fall of chest Neuro: Moving all four extremities Psych: Resting currently, calm and cooperative when awake    ED Course / MDM  EKG:   I have reviewed the labs performed to date as well as medications administered while in observation.  Recent changes in the last 24 hours include no acute events.  Plan  Current plan is for psychiatric disposition.  Henry Black is not under involuntary commitment.     Henry Black, Layla Maw, DO 03/10/21 [phone]

## 2021-03-10 NOTE — ED Notes (Signed)
RN attempted to feed pt pureed diet. Pt wouldn't stay alert and awake enough to eat.

## 2021-03-10 NOTE — ED Notes (Signed)
Rn to bedside to introduce self to pt. Pt sleeping. He opened his eyes and moaned and went back to bed.

## 2021-03-10 NOTE — ED Notes (Signed)
Bed alarm going off. RN to bedside. Pt waas standing in the room at end of bed saturated in urine. Bed linens changed. Gown changed and new brief placed. Pt placed back in bed.

## 2021-03-10 NOTE — ED Notes (Signed)
Bed alarm going off. Pt up out of bed. Assisted pt back to bed.

## 2021-03-11 DIAGNOSIS — F203 Undifferentiated schizophrenia: Secondary | ICD-10-CM | POA: Diagnosis not present

## 2021-03-11 NOTE — ED Notes (Signed)
Pt opening eyes to verbal stimuli. This RN attempting to assess orientation and provide medications. Pt states "you're about to get cussed out", this RN explained trying to help and provide medications. Pt not tolerating assistance. Took medications with encouragement.  Denies needing to urinate or urinal.  Provided breakfast tray and assisted with set up. Stretcher locked in lowest position, bed alarm in place.

## 2021-03-11 NOTE — ED Notes (Signed)
VOL  PENDING  PLACEMENT 

## 2021-03-11 NOTE — ED Notes (Signed)
Pt is refusing to wear vital sign monitoring equipment.

## 2021-03-11 NOTE — ED Notes (Addendum)
Per zach, previous RN-pt did not have any belongings in his room or on him when transferred from room 3 to quad 23.

## 2021-03-11 NOTE — ED Provider Notes (Signed)
Emergency Medicine Observation Re-evaluation Note  Henry Black is a 65 y.o. male, seen on rounds today.  Pt initially presented to the ED for complaints of No chief complaint on file. Currently, the patient is resting, no acute concerns.  Physical Exam  BP 128/79   Pulse 75   Temp 98.6 F (37 C) (Oral)   Resp 15   Wt 78.1 kg   SpO2 99%   BMI 22.72 kg/m  Physical Exam General: no acute distress Lungs: equal chest rise Psych: calm  ED Course / MDM  EKG:EKG Interpretation  Date/Time:  Saturday March 09 2021 04:43:56 EDT Ventricular Rate:  75 PR Interval:  134 QRS Duration: 95 QT Interval:  388 QTC Calculation: 434 R Axis:   15 Text Interpretation: Sinus rhythm Abnormal R-wave progression, early transition Baseline wander in lead(s) V2 V3 V4 V5 V6 Confirmed by UNCONFIRMED, DOCTOR ([id number]), editor Fredric Mare, Tammy [phone]) on 03/11/2021 8:55:41 AM  I have reviewed the labs performed to date as well as medications administered while in observation.  Recent changes in the last 24 hours include none.  Plan  Current plan is inpatient.  [name] is not under involuntary commitment.     Gilles Chiquito, MD 03/12/21 [phone]

## 2021-03-11 NOTE — ED Notes (Signed)
Gave food tray with juice. 

## 2021-03-11 NOTE — ED Notes (Signed)
Pt resting at this time, RR even and unlabored. Call bell in reach. Stretcher locked in lowest position. Bed alarm in place. VS obtained.

## 2021-03-11 NOTE — Consult Note (Signed)
[facility] Face-to-Face Psychiatry Consult   Reason for Consult:  AMS Referring Physician:  EDP Patient Identification: Henry Black MRN:  [id number] Principal Diagnosis: Schizophrenia, undifferentiated (HCC) Diagnosis:  Principal Problem:   Schizophrenia, undifferentiated (HCC)   Total Time spent with patient: 1 hour  Subjective:   Henry Black is a 65 y.o. male patient admitted with AMS.  "I ate a little better."    HPI:  65 yo male who was admitted last week to the medical floor after using cocaine and having AMS.  He was returned to his group home without being at his baseline and returned shortly to the ED.  He was diagnosed with COVID and AMS.  He is feeding himself slowly and eating.  However, he has to wear incontinent pads and his actions are very slow.  This provider received one sentence form him during the assessment.  It is noted in the chart the group home home feels he got some "bad drugs"  Past Psychiatric History: schizophrenia, substance use d/o  Risk to Self:  yes Risk to Others:  none Prior Inpatient Therapy:  multiple Prior Outpatient Therapy:  yes  Past Medical History:  Past Medical History:  Diagnosis Date   Hepatitis    Hypertension     Past Surgical History:  Procedure Laterality Date   ESOPHAGOGASTRODUODENOSCOPY (EGD) WITH PROPOFOL N/A 12/21/2019   Procedure: ESOPHAGOGASTRODUODENOSCOPY (EGD) WITH PROPOFOL;  Surgeon: Toney Reil, MD;  Location: [facility] ENDOSCOPY;  Service: Gastroenterology;  Laterality: N/A;   Family History: No family history on file. Family Psychiatric  History: unknown Social History:  Social History   Substance and Sexual Activity  Alcohol Use Not Currently   Alcohol/week: 1.0 standard drink   Types: 1 Standard drinks or equivalent per week     Social History   Substance and Sexual Activity  Drug Use Not Currently   Types: "Crack" cocaine    Social History   Socioeconomic History   Marital status: Single    Spouse  name: Not on file   Number of children: Not on file   Years of education: Not on file   Highest education level: Not on file  Occupational History   Not on file  Tobacco Use   Smoking status: Every Day    Packs/day: 0.50    Years: 46.00    Pack years: 23.00    Types: Cigarettes   Smokeless tobacco: Never  Vaping Use   Vaping Use: Never used  Substance and Sexual Activity   Alcohol use: Not Currently    Alcohol/week: 1.0 standard drink    Types: 1 Standard drinks or equivalent per week   Drug use: Not Currently    Types: "Crack" cocaine   Sexual activity: Not on file  Other Topics Concern   Not on file  Social History Narrative   Not on file   Social Determinants of Health   Financial Resource Strain: Not on file  Food Insecurity: Not on file  Transportation Needs: Not on file  Physical Activity: Not on file  Stress: Not on file  Social Connections: Not on file   Additional Social History:    Allergies:   Allergies  Allergen Reactions   Penicillins Rash    Has patient had a PCN reaction causing immediate rash, facial/tongue/throat swelling, SOB or lightheadedness with hypotension: Unknown Has patient had a PCN reaction causing severe rash involving mucus membranes or skin necrosis: Unknown Has patient had a PCN reaction that required hospitalization: Unknown Has patient had a  PCN reaction occurring within the last 10 years: Unknown If all of the above answers are "NO", then may proceed with Cephalosporin use.     Labs: No results found for this or any previous visit (from the past 48 hour(s)).  Current Facility-Administered Medications  Medication Dose Route Frequency Provider Last Rate Last Admin   amLODipine (NORVASC) tablet 5 mg  5 mg Oral Daily Charm Rings, NP   5 mg at 03/11/21 0915   [START ON 03/14/2021] atorvastatin (LIPITOR) tablet 20 mg  20 mg Oral Daily Charm Rings, NP       benztropine (COGENTIN) tablet 1 mg  1 mg Oral BID Charm Rings, NP    1 mg at 03/11/21 0915   clonazePAM (KLONOPIN) tablet 0.5 mg  0.5 mg Oral BID Charm Rings, NP   0.5 mg at 03/11/21 0910   divalproex (DEPAKOTE) DR tablet 500 mg  500 mg Oral BID Charm Rings, NP   500 mg at 03/11/21 0915   haloperidol (HALDOL) tablet 10 mg  10 mg Oral Q1500 Charm Rings, NP   10 mg at 03/11/21 0915   haloperidol (HALDOL) tablet 20 mg  20 mg Oral QHS Charm Rings, NP   20 mg at 03/10/21 2111   nirmatrelvir/ritonavir EUA (PAXLOVID) TABS 3 tablet  3 tablet Oral BID Nita Sickle, MD   3 tablet at 03/11/21 8144   Current Outpatient Medications  Medication Sig Dispense Refill   amLODipine (NORVASC) 5 MG tablet Take 1 tablet (5 mg total) by mouth daily. 30 tablet 1   atorvastatin (LIPITOR) 20 MG tablet Take 20 mg by mouth daily.     benztropine (COGENTIN) 1 MG tablet Take 1 tablet (1 mg total) by mouth 2 (two) times daily. 60 tablet 0   clonazePAM (KLONOPIN) 0.5 MG tablet Take 1 tablet (0.5 mg total) by mouth 2 (two) times daily. 30 tablet 0   divalproex (DEPAKOTE) 500 MG DR tablet Take 1 tablet (500 mg total) by mouth 2 (two) times daily. 60 tablet 1   haloperidol (HALDOL) 10 MG tablet Take 1 tablet (10 mg total) by mouth in the morning AND 2 tablets (20 mg total) at bedtime. 90 tablet 1   losartan (COZAAR) 100 MG tablet Take 100 mg by mouth daily.     Cholecalciferol 2000 units CAPS Take 2,000 Units by mouth daily. (Patient not taking: No sig reported)      Musculoskeletal: Strength & Muscle Tone: decreased Gait & Station:  did not witness Patient leans: N/A  Psychiatric Specialty Exam: Physical Exam Vitals and nursing note reviewed.  Constitutional:      Appearance: Normal appearance.  HENT:     Head: Normocephalic.     Nose: Nose normal.  Pulmonary:     Effort: Pulmonary effort is normal.  Musculoskeletal:        General: Normal range of motion.     Cervical back: Normal range of motion.  Neurological:     General: No focal deficit present.      Mental Status: He is alert.  Psychiatric:        Attention and Perception: He is inattentive.        Mood and Affect: Affect is flat.        Speech: Speech is delayed.        Behavior: Behavior is slowed.        Thought Content: Thought content normal.        Cognition and Memory:  Cognition is impaired. Memory is impaired.        Judgment: Judgment is inappropriate.    Review of Systems  Psychiatric/Behavioral:  Positive for memory loss.   All other systems reviewed and are negative.  Blood pressure 128/79, pulse 75, temperature 98.6 F (37 C), temperature source Oral, resp. rate 15, weight 78.1 kg, SpO2 99 %.Body mass index is 22.72 kg/m.  General Appearance: Casual  Eye Contact:  Fair  Speech:  Slow, minimal  Volume:  Decreased  Mood:  Depressed  Affect:  Flat  Thought Process:  UTA, minimal speech  Orientation:  Other:  person  Thought Content:   UTA  Suicidal Thoughts:  No  Homicidal Thoughts:  No  Memory:  NA  Judgement:  Fair  Insight:  UTA  Psychomotor Activity:  Decreased  Concentration:  Concentration: Poor and Attention Span: Poor  Recall:  Poor  Fund of Knowledge:  Fair  Language:  Poor  Akathisia:  No  Handed:  Right  AIMS (if indicated):     Assets:  Housing Leisure Time Resilience Social Support  ADL's:  Impaired  Cognition:  Impaired,  Moderate  Sleep:        Physical Exam: Physical Exam Vitals and nursing note reviewed.  Constitutional:      Appearance: Normal appearance.  HENT:     Head: Normocephalic.     Nose: Nose normal.  Pulmonary:     Effort: Pulmonary effort is normal.  Musculoskeletal:        General: Normal range of motion.     Cervical back: Normal range of motion.  Neurological:     General: No focal deficit present.     Mental Status: He is alert.  Psychiatric:        Attention and Perception: He is inattentive.        Mood and Affect: Affect is flat.        Speech: Speech is delayed.        Behavior: Behavior is slowed.         Thought Content: Thought content normal.        Cognition and Memory: Cognition is impaired. Memory is impaired.        Judgment: Judgment is inappropriate.   Review of Systems  Psychiatric/Behavioral:  Positive for memory loss.   All other systems reviewed and are negative. Blood pressure 128/79, pulse 75, temperature 98.6 F (37 C), temperature source Oral, resp. rate 15, weight 78.1 kg, SpO2 99 %. Body mass index is 22.72 kg/m.  Treatment Plan Summary: Daily contact with patient to assess and evaluate symptoms and progress in treatment, Medication management, and Plan : Schizophrenia, undifferentiated: -Continue Haldol 10 mg in the am and 20 mg in the pm -Continue Depakote 500 mg BID Admit to in patient psychiatric unit  EPS: -Continue Cogentin 1 mg BId  Anxiety: -Continue Klonopin 0.5 mg BID Disposition: Recommend psychiatric Inpatient admission when medically cleared.  Nanine Means, NP 03/11/2021 1:49 PM

## 2021-03-11 NOTE — ED Notes (Signed)
Pt is asleep at this time with equal rise and fall of the chest  ° °

## 2021-03-11 NOTE — ED Notes (Signed)
Pt found to have spilled breakfast on himself, pt gown changed, brief changed, chux changed

## 2021-03-12 DIAGNOSIS — F203 Undifferentiated schizophrenia: Secondary | ICD-10-CM | POA: Diagnosis not present

## 2021-03-12 LAB — CULTURE, BLOOD (ROUTINE X 2)
Culture: NO GROWTH
Culture: NO GROWTH
Special Requests: ADEQUATE

## 2021-03-12 NOTE — ED Notes (Signed)
Pt opened his room door and stated he was going to the store.  RN verbally redirected pt back into his room.

## 2021-03-12 NOTE — ED Notes (Signed)
Pt seen standing at sink eating his breakfast.

## 2021-03-12 NOTE — Consult Note (Signed)
[facility] Face-to-Face Psychiatry Consult   Reason for Consult:  AMS Referring Physician:  EDP Patient Identification: Henry Black MRN:  [id number] Principal Diagnosis: Schizophrenia, undifferentiated (HCC) Diagnosis:  Principal Problem:   Schizophrenia, undifferentiated (HCC)   Total Time spent with patient:  20 minutes  Subjective:   Henry Black is a 64 y.o. male patient admitted with AMS.  "I'm feeling a little better."    HPI:  65 yo male who was admitted last week to the medical floor after using cocaine and having AMS.  He was diagnosed with COVID in the ED.  He is ambulating more and feeding himself without issues.  Still some cognitive slowing with little verbalization.  Calm and cooperative, no issues voiced.  Past Psychiatric History: schizophrenia, substance use d/o  Risk to Self:  yes Risk to Others:  none Prior Inpatient Therapy:  multiple Prior Outpatient Therapy:  yes  Past Medical History:  Past Medical History:  Diagnosis Date   Hepatitis    Hypertension     Past Surgical History:  Procedure Laterality Date   ESOPHAGOGASTRODUODENOSCOPY (EGD) WITH PROPOFOL N/A 12/21/2019   Procedure: ESOPHAGOGASTRODUODENOSCOPY (EGD) WITH PROPOFOL;  Surgeon: Toney Reil, MD;  Location: [facility] ENDOSCOPY;  Service: Gastroenterology;  Laterality: N/A;   Family History: No family history on file. Family Psychiatric  History: unknown Social History:  Social History   Substance and Sexual Activity  Alcohol Use Not Currently   Alcohol/week: 1.0 standard drink   Types: 1 Standard drinks or equivalent per week     Social History   Substance and Sexual Activity  Drug Use Not Currently   Types: "Crack" cocaine    Social History   Socioeconomic History   Marital status: Single    Spouse name: Not on file   Number of children: Not on file   Years of education: Not on file   Highest education level: Not on file  Occupational History   Not on file  Tobacco Use    Smoking status: Every Day    Packs/day: 0.50    Years: 46.00    Pack years: 23.00    Types: Cigarettes   Smokeless tobacco: Never  Vaping Use   Vaping Use: Never used  Substance and Sexual Activity   Alcohol use: Not Currently    Alcohol/week: 1.0 standard drink    Types: 1 Standard drinks or equivalent per week   Drug use: Not Currently    Types: "Crack" cocaine   Sexual activity: Not on file  Other Topics Concern   Not on file  Social History Narrative   Not on file   Social Determinants of Health   Financial Resource Strain: Not on file  Food Insecurity: Not on file  Transportation Needs: Not on file  Physical Activity: Not on file  Stress: Not on file  Social Connections: Not on file   Additional Social History:    Allergies:   Allergies  Allergen Reactions   Penicillins Rash    Has patient had a PCN reaction causing immediate rash, facial/tongue/throat swelling, SOB or lightheadedness with hypotension: Unknown Has patient had a PCN reaction causing severe rash involving mucus membranes or skin necrosis: Unknown Has patient had a PCN reaction that required hospitalization: Unknown Has patient had a PCN reaction occurring within the last 10 years: Unknown If all of the above answers are "NO", then may proceed with Cephalosporin use.     Labs: No results found for this or any previous visit (from the past 48 hour(s)).  Current Facility-Administered Medications  Medication Dose Route Frequency Provider Last Rate Last Admin   amLODipine (NORVASC) tablet 5 mg  5 mg Oral Daily Charm Rings, NP   5 mg at 03/12/21 0945   [START ON 03/14/2021] atorvastatin (LIPITOR) tablet 20 mg  20 mg Oral Daily Charm Rings, NP       benztropine (COGENTIN) tablet 1 mg  1 mg Oral BID Charm Rings, NP   1 mg at 03/12/21 0946   clonazePAM (KLONOPIN) tablet 0.5 mg  0.5 mg Oral BID Charm Rings, NP   0.5 mg at 03/12/21 0946   divalproex (DEPAKOTE) DR tablet 500 mg  500 mg Oral  BID Charm Rings, NP   500 mg at 03/12/21 0946   haloperidol (HALDOL) tablet 10 mg  10 mg Oral Q1500 Charm Rings, NP   10 mg at 03/12/21 0946   haloperidol (HALDOL) tablet 20 mg  20 mg Oral QHS Charm Rings, NP   20 mg at 03/11/21 2232   nirmatrelvir/ritonavir EUA (PAXLOVID) TABS 3 tablet  3 tablet Oral BID Nita Sickle, MD   3 tablet at 03/11/21 2250   Current Outpatient Medications  Medication Sig Dispense Refill   amLODipine (NORVASC) 5 MG tablet Take 1 tablet (5 mg total) by mouth daily. 30 tablet 1   atorvastatin (LIPITOR) 20 MG tablet Take 20 mg by mouth daily.     benztropine (COGENTIN) 1 MG tablet Take 1 tablet (1 mg total) by mouth 2 (two) times daily. 60 tablet 0   clonazePAM (KLONOPIN) 0.5 MG tablet Take 1 tablet (0.5 mg total) by mouth 2 (two) times daily. 30 tablet 0   divalproex (DEPAKOTE) 500 MG DR tablet Take 1 tablet (500 mg total) by mouth 2 (two) times daily. 60 tablet 1   haloperidol (HALDOL) 10 MG tablet Take 1 tablet (10 mg total) by mouth in the morning AND 2 tablets (20 mg total) at bedtime. 90 tablet 1   losartan (COZAAR) 100 MG tablet Take 100 mg by mouth daily.     Cholecalciferol 2000 units CAPS Take 2,000 Units by mouth daily. (Patient not taking: No sig reported)      Musculoskeletal: Strength & Muscle Tone: decreased Gait & Station:  did not witness Patient leans: N/A  Psychiatric Specialty Exam: Physical Exam Vitals and nursing note reviewed.  Constitutional:      Appearance: Normal appearance.  HENT:     Head: Normocephalic.     Nose: Nose normal.  Pulmonary:     Effort: Pulmonary effort is normal.  Musculoskeletal:        General: Normal range of motion.     Cervical back: Normal range of motion.  Neurological:     General: No focal deficit present.     Mental Status: He is alert.  Psychiatric:        Attention and Perception: He is inattentive.        Mood and Affect: Affect is flat.        Speech: Speech is delayed.         Behavior: Behavior is slowed.        Thought Content: Thought content normal.        Cognition and Memory: Cognition is impaired. Memory is impaired.        Judgment: Judgment is inappropriate.    Review of Systems  Psychiatric/Behavioral:  Positive for memory loss.   All other systems reviewed and are negative.  Blood pressure 111/70,  pulse 80, temperature 98.6 F (37 C), temperature source Oral, resp. rate 18, weight 78.1 kg, SpO2 98 %.Body mass index is 22.72 kg/m.  General Appearance: Casual  Eye Contact:  Fair  Speech:  Slow, minimal  Volume:  Decreased  Mood:  Depressed  Affect:  Flat  Thought Process:  UTA, minimal speech  Orientation:  Other:  person  Thought Content:   UTA  Suicidal Thoughts:  No  Homicidal Thoughts:  No  Memory:  NA  Judgement:  Fair  Insight:  UTA  Psychomotor Activity:  Decreased  Concentration:  Concentration: Poor and Attention Span: Poor  Recall:  Poor  Fund of Knowledge:  Fair  Language:  Poor  Akathisia:  No  Handed:  Right  AIMS (if indicated):     Assets:  Housing Leisure Time Resilience Social Support  ADL's:  Impaired  Cognition:  Impaired,  Moderate  Sleep:        Physical Exam: Physical Exam Vitals and nursing note reviewed.  Constitutional:      Appearance: Normal appearance.  HENT:     Head: Normocephalic.     Nose: Nose normal.  Pulmonary:     Effort: Pulmonary effort is normal.  Musculoskeletal:        General: Normal range of motion.     Cervical back: Normal range of motion.  Neurological:     General: No focal deficit present.     Mental Status: He is alert.  Psychiatric:        Attention and Perception: He is inattentive.        Mood and Affect: Affect is flat.        Speech: Speech is delayed.        Behavior: Behavior is slowed.        Thought Content: Thought content normal.        Cognition and Memory: Cognition is impaired. Memory is impaired.        Judgment: Judgment is inappropriate.   Review of  Systems  Psychiatric/Behavioral:  Positive for memory loss.   All other systems reviewed and are negative. Blood pressure 111/70, pulse 80, temperature 98.6 F (37 C), temperature source Oral, resp. rate 18, weight 78.1 kg, SpO2 98 %. Body mass index is 22.72 kg/m.  Treatment Plan Summary: Daily contact with patient to assess and evaluate symptoms and progress in treatment, Medication management, and Plan : Schizophrenia, undifferentiated: -Continue Haldol 10 mg in the am and 20 mg in the pm -Continue Depakote 500 mg BID Admit to in patient psychiatric unit  EPS: -Continue Cogentin 1 mg BId  Anxiety: -Continue Klonopin 0.5 mg BID Disposition: Recommend psychiatric Inpatient admission when medically cleared.  Nanine Means, NP 03/12/2021 4:12 PM

## 2021-03-12 NOTE — ED Notes (Signed)
Pt was not alert enough to safely administer medications.  Psychiatry and EDP made aware.

## 2021-03-12 NOTE — ED Notes (Signed)
VOL/Pending Placement 

## 2021-03-12 NOTE — ED Notes (Signed)
Patient resting quietly in room. No noted distress or abnormal behaviors noted. Will continue 15 minute checks and observation by security camera for safety. 

## 2021-03-12 NOTE — ED Notes (Signed)
Pt came out of room and walked into bathroom before this writer could stop him.

## 2021-03-12 NOTE — ED Notes (Signed)
Medications all scanned and opened prior to 1st attempt at administration.

## 2021-03-12 NOTE — ED Notes (Signed)
Pt. Was given his dinner tray and a drink.  

## 2021-03-13 DIAGNOSIS — F203 Undifferentiated schizophrenia: Secondary | ICD-10-CM | POA: Diagnosis not present

## 2021-03-13 NOTE — Discharge Instructions (Addendum)
Follow up with outpatient provider  Please continue to take your remaining 2 days of COVID medications.  Return to the emergency department for any symptom personally concerning to yourself.

## 2021-03-13 NOTE — ED Provider Notes (Signed)
Emergency Medicine Observation Re-evaluation Note  Henry Black is a 65 y.o. male, seen on rounds today.  Pt initially presented to the ED for complaints of No chief complaint on file.  Currently, the patient is is no acute distress.  Patient is walking around the room.   Physical Exam  Blood pressure 111/70, pulse 80, temperature 98.6 F (37 C), temperature source Oral, resp. rate 18, weight 78.1 kg, SpO2 98 %.  Physical Exam General: no acute distress Lungs: equal chest rise Psych: calm       ED Course / MDM     I have reviewed the labs performed to date as well as medications administered while in observation.  Recent changes in the last 24 hours include  none  Plan   Current plan is to continue to wait for psych placement although he is waiting to have out his COVID positive quarantine.  Patient did not seem to sleep too well so far given his at 3 AM and he still walking around.  They may need to discuss further medications to help with sleep from the psychiatric team but given the concern that some of the psychiatric meds could be causing some of his confusion I do not want to change the medications here in the emergency room      Concha Se, MD 03/13/21 [phone]

## 2021-03-13 NOTE — ED Notes (Signed)
Attempt to call Marcia as listed emergency contact, [phone]- no answer. Unable to leave message due to VM full 

## 2021-03-13 NOTE — ED Notes (Signed)
Patient sleeping in no distress. Restlessness noted periodically. VS not obtained at this time. Will continue to monitor.

## 2021-03-13 NOTE — Consult Note (Signed)
[facility] Psych ED Discharge  03/13/2021 10:53 AM Henry Black  MRN:  [id number]  Method of visit?: Face to Face   Principal Problem: Schizophrenia, undifferentiated (HCC) Discharge Diagnoses: Principal Problem:   Schizophrenia, undifferentiated (HCC)   Subjective: "I'm ready to go back."  65 yo male presented to the ED with AMS after using street drugs, history of schizophrenia and tested positive for COVID.  He has progressively improved and has been walking around his room and eating without issues.  Today, he requested his floor to be cleaned as it was dirty and it was.  He denies hallucinations, paranoia, suicidal/homicidal ideations, and withdrawal symptoms.  Educated on refraining from using drugs and alcohol, especially with his medications.  Psychiatrically stable for discharge.  Total Time spent with patient: 45 minutes  Past Psychiatric History: schizophrenia, substance abuse  Past Medical History:  Past Medical History:  Diagnosis Date   Hepatitis    Hypertension     Past Surgical History:  Procedure Laterality Date   ESOPHAGOGASTRODUODENOSCOPY (EGD) WITH PROPOFOL N/A 12/21/2019   Procedure: ESOPHAGOGASTRODUODENOSCOPY (EGD) WITH PROPOFOL;  Surgeon: Toney Reil, MD;  Location: [facility] ENDOSCOPY;  Service: Gastroenterology;  Laterality: N/A;   Family History: No family history on file. Family Psychiatric  History: unknown Social History:  Social History   Substance and Sexual Activity  Alcohol Use Not Currently   Alcohol/week: 1.0 standard drink   Types: 1 Standard drinks or equivalent per week     Social History   Substance and Sexual Activity  Drug Use Not Currently   Types: "Crack" cocaine    Social History   Socioeconomic History   Marital status: Single    Spouse name: Not on file   Number of children: Not on file   Years of education: Not on file   Highest education level: Not on file  Occupational History   Not on file  Tobacco Use   Smoking  status: Every Day    Packs/day: 0.50    Years: 46.00    Pack years: 23.00    Types: Cigarettes   Smokeless tobacco: Never  Vaping Use   Vaping Use: Never used  Substance and Sexual Activity   Alcohol use: Not Currently    Alcohol/week: 1.0 standard drink    Types: 1 Standard drinks or equivalent per week   Drug use: Not Currently    Types: "Crack" cocaine   Sexual activity: Not on file  Other Topics Concern   Not on file  Social History Narrative   Not on file   Social Determinants of Health   Financial Resource Strain: Not on file  Food Insecurity: Not on file  Transportation Needs: Not on file  Physical Activity: Not on file  Stress: Not on file  Social Connections: Not on file    Tobacco Cessation:  A prescription for an FDA-approved tobacco cessation medication was offered at discharge and the patient refused  Current Medications: Current Facility-Administered Medications  Medication Dose Route Frequency Provider Last Rate Last Admin   amLODipine (NORVASC) tablet 5 mg  5 mg Oral Daily Charm Rings, NP   5 mg at 03/13/21 0937   [START ON 03/14/2021] atorvastatin (LIPITOR) tablet 20 mg  20 mg Oral Daily Charm Rings, NP       benztropine (COGENTIN) tablet 1 mg  1 mg Oral BID Charm Rings, NP   1 mg at 03/13/21 0488   clonazePAM (KLONOPIN) tablet 0.5 mg  0.5 mg Oral BID Charm Rings,  NP   0.5 mg at 03/13/21 0937   divalproex (DEPAKOTE) DR tablet 500 mg  500 mg Oral BID Charm Rings, NP   500 mg at 03/13/21 1610   haloperidol (HALDOL) tablet 10 mg  10 mg Oral Q1500 Charm Rings, NP   10 mg at 03/13/21 9604   haloperidol (HALDOL) tablet 20 mg  20 mg Oral QHS Charm Rings, NP   20 mg at 03/12/21 2131   nirmatrelvir/ritonavir EUA (PAXLOVID) TABS 3 tablet  3 tablet Oral BID Nita Sickle, MD   3 tablet at 03/13/21 5409   Current Outpatient Medications  Medication Sig Dispense Refill   amLODipine (NORVASC) 5 MG tablet Take 1 tablet (5 mg total) by  mouth daily. 30 tablet 1   atorvastatin (LIPITOR) 20 MG tablet Take 20 mg by mouth daily.     benztropine (COGENTIN) 1 MG tablet Take 1 tablet (1 mg total) by mouth 2 (two) times daily. 60 tablet 0   clonazePAM (KLONOPIN) 0.5 MG tablet Take 1 tablet (0.5 mg total) by mouth 2 (two) times daily. 30 tablet 0   divalproex (DEPAKOTE) 500 MG DR tablet Take 1 tablet (500 mg total) by mouth 2 (two) times daily. 60 tablet 1   haloperidol (HALDOL) 10 MG tablet Take 1 tablet (10 mg total) by mouth in the morning AND 2 tablets (20 mg total) at bedtime. 90 tablet 1   losartan (COZAAR) 100 MG tablet Take 100 mg by mouth daily.     Cholecalciferol 2000 units CAPS Take 2,000 Units by mouth daily. (Patient not taking: No sig reported)     PTA Medications: (Not in a hospital admission)   Musculoskeletal: Strength & Muscle Tone: within normal limits Gait & Station: normal Patient leans: N/A  Psychiatric Specialty Exam: Physical Exam Vitals and nursing note reviewed.  Constitutional:      Appearance: Normal appearance.  HENT:     Nose: Nose normal.  Pulmonary:     Effort: Pulmonary effort is normal.  Musculoskeletal:        General: Normal range of motion.     Cervical back: Normal range of motion.  Neurological:     General: No focal deficit present.     Mental Status: He is alert and oriented to person, place, and time.  Psychiatric:        Attention and Perception: Attention and perception normal.        Mood and Affect: Mood normal. Affect is blunt.        Speech: Speech normal.        Behavior: Behavior normal. Behavior is cooperative.        Thought Content: Thought content normal.        Cognition and Memory: Cognition and memory normal.        Judgment: Judgment normal.    Review of Systems  All other systems reviewed and are negative.  Blood pressure 113/66, pulse 95, temperature (!) 97.4 F (36.3 C), temperature source Oral, resp. rate 18, weight 78.1 kg, SpO2 97 %.Body mass index  is 22.72 kg/m.  General Appearance: Casual  Eye Contact:  Good  Speech:  Normal Rate  Volume:  Normal  Mood:  Euthymic  Affect:  Blunt  Thought Process:  Coherent and Descriptions of Associations: Intact  Orientation:  Full (Time, Place, and Person)  Thought Content:  WDL and Logical  Suicidal Thoughts:  No  Homicidal Thoughts:  No  Memory:  Immediate;   Fair Recent;  Fair Remote;   Fair  Judgement:  Fair  Insight:  Fair  Psychomotor Activity:  Normal  Concentration:  Concentration: Good and Attention Span: Good  Recall:  [employer] of Knowledge:  Fair  Language:  Good  Akathisia:  No  Handed:  Right  AIMS (if indicated):     Assets:  Housing Leisure Time Resilience Social Support  ADL's:  Intact  Cognition:  WNL  Sleep:         Physical Exam: Physical Exam Vitals and nursing note reviewed.  Constitutional:      Appearance: Normal appearance.  HENT:     Nose: Nose normal.  Pulmonary:     Effort: Pulmonary effort is normal.  Musculoskeletal:        General: Normal range of motion.     Cervical back: Normal range of motion.  Neurological:     General: No focal deficit present.     Mental Status: He is alert and oriented to person, place, and time.  Psychiatric:        Attention and Perception: Attention and perception normal.        Mood and Affect: Mood normal. Affect is blunt.        Speech: Speech normal.        Behavior: Behavior normal. Behavior is cooperative.        Thought Content: Thought content normal.        Cognition and Memory: Cognition and memory normal.        Judgment: Judgment normal.   Review of Systems  All other systems reviewed and are negative. Blood pressure 113/66, pulse 95, temperature (!) 97.4 F (36.3 C), temperature source Oral, resp. rate 18, weight 78.1 kg, SpO2 97 %. Body mass index is 22.72 kg/m.   Demographic Factors:  Male  Loss Factors: NA  Historical Factors: NA  Risk Reduction Factors:   Sense of  responsibility to family, Living with another person, especially a relative, Positive social support, and Positive therapeutic relationship  Continued Clinical Symptoms:  NOne  Cognitive Features That Contribute To Risk:  None    Suicide Risk:  Minimal: No identifiable suicidal ideation.  Patients presenting with no risk factors but with morbid ruminations; may be classified as minimal risk based on the severity of the depressive symptoms    Plan Of Care/Follow-up recommendations:  Activity:  as tolerated  Diet:  heart healthy diet Schizophrenia, undifferentiated: -Continue Haldol 10 mg in the am and 20 mg in the pm -Continue Depakote 500 mg BID   EPS: -Continue Cogentin 1 mg BId   Anxiety: -Continue Klonopin 0.5 mg BID  Disposition: discharge to group home Nanine Means, NP 03/13/2021, 10:53 AM

## 2021-03-13 NOTE — ED Notes (Signed)
Pt given a cup of sprite per request.  

## 2021-03-13 NOTE — ED Notes (Signed)
Group home staff here to pick up patient, pt's  Belongings found in the [employer] and noted to still have live bedbugs crawling on the shorts

## 2021-03-13 NOTE — ED Notes (Signed)
Pt resting quietly, cont to monitor °

## 2021-03-13 NOTE — ED Notes (Signed)
Given lunch

## 2021-03-13 NOTE — ED Notes (Signed)
Spoke with Tonye Becket at [employer] he is unable to get in touch with group home owner as well. States he will try calling her to get patient a ride home. [phone]

## 2021-03-13 NOTE — ED Notes (Signed)
Attempt to call Leta Jungling as listed emergency contact, [phone]- no answer. Unable to leave message due to VM full

## 2021-03-13 NOTE — ED Notes (Signed)
[name], group home owner called back. RN spoke with her at length regarding pt up for discharge back to group home, COVID precautions and isolation recommendations. Ms. Nedra Hai clearly unhappy with decision to discharge patient back to group home because he has COVID. States that it jeopardizes the other group home members. RN interpreted that Ms. Nedra Hai wanted the Emergency Room to keep patient until the end of his quarantine period. Explained to her that we are unable to hold patient in the Emergency Room due to COVID quarrentine as a favor to group home. Offered to send home patient with additional masks, gloves and clean urinal. Pt has been appropriate in the Emergency Room, staying in room, not needing much redirection. Ms. Nedra Hai states "I'll see what I can do" and then phone conversation ends.

## 2021-03-13 NOTE — ED Provider Notes (Signed)
-----------------------------------------   11:06 AM on 03/13/2021 ----------------------------------------- Patient has been seen and evaluated by psychiatry.  They believe the patient safe for discharge home.  We will discharge the patient home with his 2 remaining days of antivirals for COVID.  No other acute findings from a medical perspective.   Minna Antis, MD 03/13/21 [phone]

## 2021-03-14 ENCOUNTER — Emergency Department: Payer: Medicare Other

## 2021-03-14 ENCOUNTER — Other Ambulatory Visit: Payer: Self-pay

## 2021-03-14 ENCOUNTER — Inpatient Hospital Stay
Admission: EM | Admit: 2021-03-14 | Discharge: 2021-03-18 | DRG: 091 | Disposition: A | Discharge status: Home Health | Payer: Medicare Other | Attending: Internal Medicine | Admitting: Internal Medicine

## 2021-03-14 DIAGNOSIS — R4182 Altered mental status, unspecified: Principal | ICD-10-CM | POA: Diagnosis present

## 2021-03-14 DIAGNOSIS — I10 Essential (primary) hypertension: Secondary | ICD-10-CM | POA: Diagnosis present

## 2021-03-14 DIAGNOSIS — E785 Hyperlipidemia, unspecified: Secondary | ICD-10-CM | POA: Diagnosis present

## 2021-03-14 DIAGNOSIS — F209 Schizophrenia, unspecified: Secondary | ICD-10-CM

## 2021-03-14 DIAGNOSIS — G929 Unspecified toxic encephalopathy: Principal | ICD-10-CM | POA: Diagnosis present

## 2021-03-14 DIAGNOSIS — J9811 Atelectasis: Secondary | ICD-10-CM | POA: Diagnosis present

## 2021-03-14 DIAGNOSIS — F17210 Nicotine dependence, cigarettes, uncomplicated: Secondary | ICD-10-CM | POA: Diagnosis present

## 2021-03-14 DIAGNOSIS — Z79899 Other long term (current) drug therapy: Secondary | ICD-10-CM

## 2021-03-14 DIAGNOSIS — U071 COVID-19: Secondary | ICD-10-CM | POA: Diagnosis present

## 2021-03-14 DIAGNOSIS — K7460 Unspecified cirrhosis of liver: Secondary | ICD-10-CM | POA: Diagnosis present

## 2021-03-14 DIAGNOSIS — R400 Somnolence: Secondary | ICD-10-CM

## 2021-03-14 DIAGNOSIS — F1410 Cocaine abuse, uncomplicated: Secondary | ICD-10-CM | POA: Diagnosis present

## 2021-03-14 DIAGNOSIS — I671 Cerebral aneurysm, nonruptured: Secondary | ICD-10-CM | POA: Diagnosis present

## 2021-03-14 DIAGNOSIS — F203 Undifferentiated schizophrenia: Secondary | ICD-10-CM | POA: Diagnosis present

## 2021-03-14 DIAGNOSIS — Z880 Allergy status to penicillin: Secondary | ICD-10-CM

## 2021-03-14 LAB — CBC WITH DIFFERENTIAL/PLATELET
Abs Immature Granulocytes: 0.03 10*3/uL (ref 0.00–0.07)
Basophils Absolute: 0 10*3/uL (ref 0.0–0.1)
Basophils Relative: 0 %
Eosinophils Absolute: 0 10*3/uL (ref 0.0–0.5)
Eosinophils Relative: 0 %
HCT: 33.1 % — ABNORMAL LOW (ref 39.0–52.0)
Hemoglobin: 11.3 g/dL — ABNORMAL LOW (ref 13.0–17.0)
Immature Granulocytes: 0 %
Lymphocytes Relative: 20 %
Lymphs Abs: 1.9 10*3/uL (ref 0.7–4.0)
MCH: 30.2 pg (ref 26.0–34.0)
MCHC: 34.1 g/dL (ref 30.0–36.0)
MCV: 88.5 fL (ref 80.0–100.0)
Monocytes Absolute: 1.1 10*3/uL — ABNORMAL HIGH (ref 0.1–1.0)
Monocytes Relative: 11 %
Neutro Abs: 6.6 10*3/uL (ref 1.7–7.7)
Neutrophils Relative %: 69 %
Platelets: 226 10*3/uL (ref 150–400)
RBC: 3.74 MIL/uL — ABNORMAL LOW (ref 4.22–5.81)
RDW: 13.2 % (ref 11.5–15.5)
WBC: 9.6 10*3/uL (ref 4.0–10.5)
nRBC: 0 % (ref 0.0–0.2)

## 2021-03-14 LAB — URINALYSIS, COMPLETE (UACMP) WITH MICROSCOPIC
Bilirubin Urine: NEGATIVE
Glucose, UA: NEGATIVE mg/dL
Ketones, ur: NEGATIVE mg/dL
Leukocytes,Ua: NEGATIVE
Nitrite: NEGATIVE
Protein, ur: NEGATIVE mg/dL
Specific Gravity, Urine: 1.018 (ref 1.005–1.030)
pH: 6 (ref 5.0–8.0)

## 2021-03-14 LAB — URINE DRUG SCREEN, QUALITATIVE (ARMC ONLY)
Amphetamines, Ur Screen: NOT DETECTED
Barbiturates, Ur Screen: NOT DETECTED
Benzodiazepine, Ur Scrn: NOT DETECTED
Cannabinoid 50 Ng, Ur ~~LOC~~: NOT DETECTED
Cocaine Metabolite,Ur ~~LOC~~: NOT DETECTED
MDMA (Ecstasy)Ur Screen: NOT DETECTED
Methadone Scn, Ur: NOT DETECTED
Opiate, Ur Screen: NOT DETECTED
Phencyclidine (PCP) Ur S: NOT DETECTED
Tricyclic, Ur Screen: NOT DETECTED

## 2021-03-14 LAB — COMPREHENSIVE METABOLIC PANEL
ALT: 26 U/L (ref 0–44)
AST: 77 U/L — ABNORMAL HIGH (ref 15–41)
Albumin: 3.9 g/dL (ref 3.5–5.0)
Alkaline Phosphatase: 53 U/L (ref 38–126)
Anion gap: 9 (ref 5–15)
BUN: 35 mg/dL — ABNORMAL HIGH (ref 8–23)
CO2: 25 mmol/L (ref 22–32)
Calcium: 9.7 mg/dL (ref 8.9–10.3)
Chloride: 102 mmol/L (ref 98–111)
Creatinine, Ser: 1.37 mg/dL — ABNORMAL HIGH (ref 0.61–1.24)
GFR, Estimated: 58 mL/min — ABNORMAL LOW (ref >60)
Glucose, Bld: 105 mg/dL — ABNORMAL HIGH (ref 70–99)
Potassium: 4.2 mmol/L (ref 3.5–5.1)
Sodium: 136 mmol/L (ref 135–145)
Total Bilirubin: 0.6 mg/dL (ref 0.3–1.2)
Total Protein: 7.8 g/dL (ref 6.5–8.1)

## 2021-03-14 LAB — ETHANOL: Alcohol, Ethyl (B): <10 mg/dL (ref <10)

## 2021-03-14 LAB — BLOOD GAS, VENOUS
Acid-Base Excess: 0.8 mmol/L (ref 0.0–2.0)
Bicarbonate: 26 mmol/L (ref 20.0–28.0)
O2 Saturation: 94.3 %
Patient temperature: 37
pCO2, Ven: 43 mmHg — ABNORMAL LOW (ref 44.0–60.0)
pH, Ven: 7.39 (ref 7.250–7.430)
pO2, Ven: 73 mmHg — ABNORMAL HIGH (ref 32.0–45.0)

## 2021-03-14 LAB — PROCALCITONIN: Procalcitonin: <0.1 ng/mL

## 2021-03-14 LAB — AMMONIA: Ammonia: 29 umol/L (ref 9–35)

## 2021-03-14 LAB — VALPROIC ACID LEVEL: Valproic Acid Lvl: 59 ug/mL (ref 50.0–100.0)

## 2021-03-14 LAB — TROPONIN I (HIGH SENSITIVITY): Troponin I (High Sensitivity): 11 ng/L (ref <18)

## 2021-03-14 MED ORDER — DEXTROSE IN LACTATED RINGERS 5 % IV SOLN: INTRAVENOUS | Status: DC

## 2021-03-14 MED ORDER — ASPIRIN 300 MG RE SUPP
300.0000 mg | Freq: Every day | RECTAL | Status: DC
  Administered 2021-03-14 – 2021-03-15 (×2): 300 mg via RECTAL
  Filled 2021-03-14 (×3): qty 1

## 2021-03-14 MED ORDER — SODIUM CHLORIDE 0.9 % IV SOLN
500.0000 mg | Freq: Once | INTRAVENOUS | Status: AC
  Administered 2021-03-14: 500 mg via INTRAVENOUS
  Filled 2021-03-14: qty 500

## 2021-03-14 MED ORDER — REMDESIVIR 100 MG IV SOLR
100.0000 mg | Freq: Every day | INTRAVENOUS | Status: AC
  Administered 2021-03-15 – 2021-03-18 (×4): 100 mg via INTRAVENOUS
  Filled 2021-03-14 (×2): qty 20
  Filled 2021-03-14 (×2): qty 100

## 2021-03-14 MED ORDER — ACETAMINOPHEN 650 MG RE SUPP: 650.0000 mg | Freq: Four times a day (QID) | RECTAL | Status: DC | PRN

## 2021-03-14 MED ORDER — SODIUM CHLORIDE 0.9 % IV SOLN
1.0000 g | Freq: Once | INTRAVENOUS | Status: AC
  Administered 2021-03-14: 1 g via INTRAVENOUS
  Filled 2021-03-14: qty 10

## 2021-03-14 MED ORDER — ONDANSETRON HCL 4 MG/2ML IJ SOLN: 4.0000 mg | Freq: Four times a day (QID) | INTRAMUSCULAR | Status: DC | PRN

## 2021-03-14 MED ORDER — ONDANSETRON HCL 4 MG PO TABS
4.0000 mg | ORAL_TABLET | Freq: Four times a day (QID) | ORAL | Status: DC | PRN
Start: 2021-03-14 — End: 2021-03-15

## 2021-03-14 MED ORDER — NIRMATRELVIR/RITONAVIR (PAXLOVID)TABLET
3.0000 | ORAL_TABLET | Freq: Two times a day (BID) | ORAL | Status: DC
Start: 2021-03-14 — End: 2021-03-14
  Filled 2021-03-14: qty 30

## 2021-03-14 MED ORDER — ACETAMINOPHEN 325 MG PO TABS: 650.0000 mg | ORAL_TABLET | Freq: Four times a day (QID) | ORAL | Status: DC | PRN

## 2021-03-14 MED ORDER — DEXTROSE-NACL 5-0.9 % IV SOLN: INTRAVENOUS | Status: DC

## 2021-03-14 MED ORDER — SODIUM CHLORIDE 0.9 % IV SOLN
200.0000 mg | Freq: Once | INTRAVENOUS | Status: AC
  Administered 2021-03-14: 200 mg via INTRAVENOUS
  Filled 2021-03-14: qty 40

## 2021-03-14 MED ORDER — LACTATED RINGERS IV BOLUS
1000.0000 mL | Freq: Once | INTRAVENOUS | Status: AC
  Administered 2021-03-14: 1000 mL via INTRAVENOUS

## 2021-03-14 NOTE — ED Notes (Signed)
Resumed care from reina rn   Pt sleeping  nsr on monitor.

## 2021-03-14 NOTE — ED Notes (Signed)
Spoke with [facility] in [place] and let them know pt is ready. Will pickup shortly.

## 2021-03-14 NOTE — ED Notes (Signed)
Pt to CT

## 2021-03-14 NOTE — ED Notes (Signed)
Pt decontaminated head to toe, front and back with wipes. Full linen change to bed. Will inform CT.

## 2021-03-14 NOTE — Consult Note (Addendum)
Client returned from his group home after discharging yesterday afternoon. This NP saw him on Saturday and he presented with an AMS, progressively cleared throughout the week.  He went from not being able to feed himself to walking without issues and eating without problems.  Henry Black was able to communicate hiss needs and returned to his baseline.  Today, he is in a worse state than he presented on Saturday.  He would not respond to his name even after attempts to physical arouse him, no eye contact, significant change in mental status.  Nanine Means, PMHNP

## 2021-03-14 NOTE — ED Notes (Signed)
Bedbugs found crawling on pt. Dr Larinda Buttery informed.
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
## 2021-03-14 NOTE — ED Notes (Signed)
Pt has had promofit on all day and has not urinated. Performed bladder scan, revealed on bladder scan. Informed Dr Joylene Igo. Pt sleeping. VS wnl but continues to be unresponsive except to sternal rub.

## 2021-03-14 NOTE — ED Provider Notes (Signed)
[facility] Emergency Department Provider Note   ____________________________________________   Event Date/Time   First MD Initiated Contact with Patient 03/14/21 1117     (approximate)  I have reviewed the triage vital signs and the nursing notes.   HISTORY  Chief Complaint Altered Mental Status    HPI Henry Black is a 65 y.o. male with past medical history of hypertension, hepatitis C, cirrhosis, and schizophrenia presents to the ED for altered mental status.  History is limited as patient is currently nonverbal.  Per EMS, caregiver at patient's group home was concerned that his mental status had changed overnight into this morning.  Staff was concerned that he was not as alert as usual and not responding like his typical self.  Patient shakes his head "no" when asked if he is in any pain, otherwise does not respond to questions.  He tested positive for COVID-19 5 days ago and recently completed a course of Paxlovid per EMS.        Past Medical History:  Diagnosis Date   Hepatitis    Hypertension     Patient Active Problem List   Diagnosis Date Noted   Aspiration pneumonia (HCC) 03/07/2021   Cocaine abuse (HCC) 03/05/2021   Essential hypertension 11/27/2020   Schizophrenia, undifferentiated (HCC) 11/26/2020   Chronic hepatitis C (HCC) 03/29/2020   Hepatic cirrhosis (HCC) 03/29/2020    Past Surgical History:  Procedure Laterality Date   ESOPHAGOGASTRODUODENOSCOPY (EGD) WITH PROPOFOL N/A 12/21/2019   Procedure: ESOPHAGOGASTRODUODENOSCOPY (EGD) WITH PROPOFOL;  Surgeon: Toney Reil, MD;  Location: [facility] ENDOSCOPY;  Service: Gastroenterology;  Laterality: N/A;    Prior to Admission medications   Medication Sig Start Date End Date Taking? Authorizing Provider  amLODipine (NORVASC) 5 MG tablet Take 1 tablet (5 mg total) by mouth daily. 12/11/20   Jesse Sans, MD  atorvastatin (LIPITOR) 20 MG tablet Take 20 mg by mouth daily.     [provider]  benztropine (COGENTIN) 1 MG tablet Take 1 tablet (1 mg total) by mouth 2 (two) times daily. 03/08/21   Arnetha Courser, MD  clonazePAM (KLONOPIN) 0.5 MG tablet Take 1 tablet (0.5 mg total) by mouth 2 (two) times daily. 03/08/21   Arnetha Courser, MD  divalproex (DEPAKOTE) 500 MG DR tablet Take 1 tablet (500 mg total) by mouth 2 (two) times daily. 12/10/20   Jesse Sans, MD  haloperidol (HALDOL) 10 MG tablet Take 1 tablet (10 mg total) by mouth in the morning AND 2 tablets (20 mg total) at bedtime. 12/10/20   Jesse Sans, MD  losartan (COZAAR) 100 MG tablet Take 100 mg by mouth daily.    [provider]    Allergies Penicillins  History reviewed. No pertinent family history.  Social History Social History   Tobacco Use   Smoking status: Every Day    Packs/day: 0.50    Years: 46.00    Pack years: 23.00    Types: Cigarettes   Smokeless tobacco: Never  Vaping Use   Vaping Use: Never used  Substance Use Topics   Alcohol use: Not Currently    Alcohol/week: 1.0 standard drink    Types: 1 Standard drinks or equivalent per week   Drug use: Not Currently    Types: "Crack" cocaine    Review of Systems Unable to obtain secondary to altered mental status  ____________________________________________   PHYSICAL EXAM:  VITAL SIGNS: ED Triage Vitals  Enc Vitals Group     BP  Pulse      Resp      Temp      Temp src      SpO2      Weight      Height      Head Circumference      Peak Flow      Pain Score      Pain Loc      Pain Edu?      Excl. in GC?     Constitutional: Somnolent but arousable to voice. Eyes: Conjunctivae are normal.  Pupils equal, round, and reactive to light bilaterally. Head: Atraumatic. Nose: No congestion/rhinnorhea. Mouth/Throat: Mucous membranes are moist. Neck: Normal ROM Cardiovascular: Normal rate, regular rhythm. Grossly normal heart sounds.  2+ radial pulses bilaterally. Respiratory: Normal  respiratory effort.  No retractions. Lungs CTAB. Gastrointestinal: Soft and nontender. No distention. Genitourinary: deferred Musculoskeletal: No lower extremity tenderness nor edema. Neurologic: No verbal response noted. No gross focal neurologic deficits are appreciated, moves all 4 extremities purposefully. Skin:  Skin is warm, dry and intact. No rash noted. Psychiatric: Unable to assess.  ____________________________________________   LABS (all labs ordered are listed, but only abnormal results are displayed)  Labs Reviewed  COMPREHENSIVE METABOLIC PANEL - Abnormal; Notable for the following components:      Result Value   Glucose, Bld 105 (*)    BUN 35 (*)    Creatinine, Ser 1.37 (*)    AST 77 (*)    GFR, Estimated 58 (*)    All other components within normal limits  CBC WITH DIFFERENTIAL/PLATELET - Abnormal; Notable for the following components:   RBC 3.74 (*)    Hemoglobin 11.3 (*)    HCT 33.1 (*)    Monocytes Absolute 1.1 (*)    All other components within normal limits  BLOOD GAS, VENOUS - Abnormal; Notable for the following components:   pCO2, Ven 43 (*)    pO2, Ven 73.0 (*)    All other components within normal limits  AMMONIA  ETHANOL  VALPROIC ACID LEVEL  PROCALCITONIN  URINE DRUG SCREEN, QUALITATIVE ([facility] ONLY)  URINALYSIS, COMPLETE ([facility]MP) WITH MICROSCOPIC  TROPONIN I (HIGH SENSITIVITY)   ____________________________________________  EKG  ED ECG REPORT I, Chesley Noon, the attending physician, personally viewed and interpreted this ECG.   Date: 03/14/2021  EKG Time: 11:18  Rate: 91  Rhythm: normal sinus rhythm  Axis: RAD  Intervals:none  ST&T Change: None   PROCEDURES  Procedure(s) performed (including Critical Care):  Procedures   ____________________________________________   INITIAL IMPRESSION / ASSESSMENT AND PLAN / ED COURSE      66 year old male with past medical history of hypertension, hepatitis C, cirrhosis, and  schizophrenia who presents to the ED due to concern for altered mental status, he is reportedly less alert and interactive than usual per staff at his group home.  Patient does appear somewhat somnolent, does not verbally respond but does open his eyes and shakes his head "no" when asked if he is in any pain.  He does not appear to have any focal neurologic deficits on exam but we will screen CT head given concern for acute change.  We will check for infectious process with chest x-ray as he does have a history of aspiration, UA also pending.  He is on Depakote and we will check Depakote level along with ammonia level.  CT head is negative for acute process, chest x-ray reviewed by me and shows questionable developing infiltrate however procalcitonin level is undetectable.  He was given dose of IV antibiotics but this may not necessarily need to be continued.  UA and UDS are pending.  Depakote level is within normal limits and ammonia level is also reassuring, explanation for his altered mental status is unclear.  Patient was evaluated by NP Lord of psychiatry, who agrees that this is a significant departure from his condition when he was in the ED yesterday.  Case discussed with hospitalist for admission.      ____________________________________________   FINAL CLINICAL IMPRESSION(S) / ED DIAGNOSES  Final diagnoses:  Altered mental status, unspecified altered mental status type  Schizophrenia, unspecified type [facility])     ED Discharge Orders     None        Note:  This document was prepared using Dragon voice recognition software and may include unintentional dictation errors.    Chesley Noon, MD 03/14/21 [phone]

## 2021-03-14 NOTE — ED Notes (Signed)
Pt sleeping.  nsr on monitor. Iv fluids infusing.

## 2021-03-14 NOTE — ED Notes (Addendum)
Patient sleeping comfortably and checked and has not had any urine production.

## 2021-03-14 NOTE — ED Notes (Signed)
CT called requesting patient to be decontaminated before can be brought to CT. Confirmed we would clean patient and let them know when ready.

## 2021-03-14 NOTE — ED Triage Notes (Signed)
altered mental status Hx cirrhosis, DM, shizophrenia from group home "[facility]", [facility] Oral T 99.1, other VS and 12 lead wnl, cbg 135 Not following commands at all. Not wearing pants, this is his baseline Pt does use cocaine per group home Group home called EMS, stated not at baseline Recently came off covid quarantine Dr Larinda Buttery at bedside

## 2021-03-15 ENCOUNTER — Encounter: Payer: Self-pay | Admitting: Internal Medicine

## 2021-03-15 ENCOUNTER — Other Ambulatory Visit: Payer: Self-pay

## 2021-03-15 DIAGNOSIS — U071 COVID-19: Secondary | ICD-10-CM | POA: Diagnosis present

## 2021-03-15 DIAGNOSIS — F203 Undifferentiated schizophrenia: Secondary | ICD-10-CM

## 2021-03-15 DIAGNOSIS — I671 Cerebral aneurysm, nonruptured: Secondary | ICD-10-CM | POA: Diagnosis present

## 2021-03-15 DIAGNOSIS — J9811 Atelectasis: Secondary | ICD-10-CM | POA: Diagnosis present

## 2021-03-15 DIAGNOSIS — Z79899 Other long term (current) drug therapy: Secondary | ICD-10-CM | POA: Diagnosis not present

## 2021-03-15 DIAGNOSIS — E785 Hyperlipidemia, unspecified: Secondary | ICD-10-CM | POA: Diagnosis present

## 2021-03-15 DIAGNOSIS — Z880 Allergy status to penicillin: Secondary | ICD-10-CM | POA: Diagnosis not present

## 2021-03-15 DIAGNOSIS — R4182 Altered mental status, unspecified: Secondary | ICD-10-CM | POA: Diagnosis present

## 2021-03-15 DIAGNOSIS — F1410 Cocaine abuse, uncomplicated: Secondary | ICD-10-CM | POA: Diagnosis present

## 2021-03-15 DIAGNOSIS — G929 Unspecified toxic encephalopathy: Secondary | ICD-10-CM | POA: Diagnosis not present

## 2021-03-15 DIAGNOSIS — K7460 Unspecified cirrhosis of liver: Secondary | ICD-10-CM | POA: Diagnosis present

## 2021-03-15 DIAGNOSIS — F17210 Nicotine dependence, cigarettes, uncomplicated: Secondary | ICD-10-CM | POA: Diagnosis present

## 2021-03-15 DIAGNOSIS — I10 Essential (primary) hypertension: Secondary | ICD-10-CM | POA: Diagnosis present

## 2021-03-15 LAB — BASIC METABOLIC PANEL
Anion gap: 5 (ref 5–15)
BUN: 22 mg/dL (ref 8–23)
CO2: 27 mmol/L (ref 22–32)
Calcium: 8.9 mg/dL (ref 8.9–10.3)
Chloride: 105 mmol/L (ref 98–111)
Creatinine, Ser: 0.93 mg/dL (ref 0.61–1.24)
GFR, Estimated: >60 mL/min (ref >60)
Glucose, Bld: 85 mg/dL (ref 70–99)
Potassium: 3.7 mmol/L (ref 3.5–5.1)
Sodium: 137 mmol/L (ref 135–145)

## 2021-03-15 LAB — CBC
HCT: 28.5 % — ABNORMAL LOW (ref 39.0–52.0)
Hemoglobin: 9.8 g/dL — ABNORMAL LOW (ref 13.0–17.0)
MCH: 30.9 pg (ref 26.0–34.0)
MCHC: 34.4 g/dL (ref 30.0–36.0)
MCV: 89.9 fL (ref 80.0–100.0)
Platelets: 176 10*3/uL (ref 150–400)
RBC: 3.17 MIL/uL — ABNORMAL LOW (ref 4.22–5.81)
RDW: 13.6 % (ref 11.5–15.5)
WBC: 7.9 10*3/uL (ref 4.0–10.5)
nRBC: 0 % (ref 0.0–0.2)

## 2021-03-15 MED ORDER — HALOPERIDOL 5 MG PO TABS
10.0000 mg | ORAL_TABLET | Freq: Two times a day (BID) | ORAL | Status: DC
  Administered 2021-03-15 – 2021-03-18 (×7): 10 mg via ORAL
  Filled 2021-03-15 (×8): qty 2

## 2021-03-15 MED ORDER — CLONAZEPAM 0.5 MG PO TABS: 0.5000 mg | ORAL_TABLET | Freq: Two times a day (BID) | ORAL | Status: DC | PRN

## 2021-03-15 MED ORDER — DIVALPROEX SODIUM 500 MG PO DR TAB
500.0000 mg | DELAYED_RELEASE_TABLET | Freq: Two times a day (BID) | ORAL | Status: DC
  Administered 2021-03-15 – 2021-03-18 (×7): 500 mg via ORAL
  Filled 2021-03-15 (×8): qty 1

## 2021-03-15 MED ORDER — AMLODIPINE BESYLATE 5 MG PO TABS: 10.0000 mg | ORAL_TABLET | Freq: Every day | ORAL | Status: DC

## 2021-03-15 MED ORDER — LACTATED RINGERS IV SOLN: INTRAVENOUS | Status: DC

## 2021-03-15 MED ORDER — HEPARIN SODIUM (PORCINE) 5000 UNIT/ML IJ SOLN
5000.0000 [IU] | Freq: Three times a day (TID) | INTRAMUSCULAR | Status: DC
  Administered 2021-03-15 – 2021-03-18 (×8): 5000 [IU] via SUBCUTANEOUS
  Filled 2021-03-15 (×8): qty 1

## 2021-03-15 MED ORDER — ATORVASTATIN CALCIUM 20 MG PO TABS
20.0000 mg | ORAL_TABLET | Freq: Every day | ORAL | Status: DC
  Administered 2021-03-15 – 2021-03-17 (×3): 20 mg via ORAL
  Filled 2021-03-15 (×3): qty 1

## 2021-03-15 MED ORDER — AMLODIPINE BESYLATE 10 MG PO TABS
10.0000 mg | ORAL_TABLET | Freq: Every day | ORAL | Status: DC
  Administered 2021-03-16 – 2021-03-17 (×2): 10 mg via ORAL
  Filled 2021-03-15 (×2): qty 1

## 2021-03-15 MED ORDER — CLONAZEPAM 0.5 MG PO TABS
0.5000 mg | ORAL_TABLET | Freq: Two times a day (BID) | ORAL | Status: DC
  Administered 2021-03-15 – 2021-03-18 (×6): 0.5 mg via ORAL
  Filled 2021-03-15 (×6): qty 1

## 2021-03-15 MED ORDER — BENZTROPINE MESYLATE 1 MG PO TABS
1.0000 mg | ORAL_TABLET | Freq: Two times a day (BID) | ORAL | Status: DC
  Administered 2021-03-15 – 2021-03-18 (×7): 1 mg via ORAL
  Filled 2021-03-15 (×8): qty 1

## 2021-03-15 MED ORDER — ALBUTEROL SULFATE HFA 108 (90 BASE) MCG/ACT IN AERS
2.0000 | INHALATION_SPRAY | Freq: Four times a day (QID) | RESPIRATORY_TRACT | Status: DC | PRN
  Filled 2021-03-15: qty 6.7

## 2021-03-15 NOTE — ED Notes (Signed)
This RN went to check on pt and he was found to be standing at the end of the bed and had ripped out both IV's and was attempting to rip out of his foley cathter. This RN was able to redirect pt back to his bed with foley in place.   Pt came to see pt and pt agreed to work with him.  Pt states he wants to go home, Dr. Thedore Mins Secure chatted to let him know of current situation.   Pysch NP made aware of situation.

## 2021-03-15 NOTE — Evaluation (Signed)
Occupational Therapy Evaluation Patient Details Name: Henry Black MRN: [id number] DOB: [birth year] Today's Date: 03/15/2021    History of Present Illness Pt is a 65 y/o M with PMH: schizophrenia, HTN, hepatitis C, cirrhosis, substance use d/o who presented last week d/t use of cocaine and altered mental status. Pt tested positive for COVID on 8/13  and recently completed a course of Paxlovid per EMS. Pt from group home "[facility]". EMS was called by caregiver d/t concern for AMS. CT of head negative.   Clinical Impression   Pt seen for OT Evaluation this date in setting of acute hospitalization d/t AMS. Pt reports being able to perform basic self care and fxl mobility at baseline. Speech is somewhat garbled and difficult to ascertain at times. Pt reports that he came from a group home and that his mom lives in [place]. Pt presents this date with some decreased safety awareness and decreased activity tolerance, but overall, appears to be at his functional baseline. He does benefit from supervision for standing tasks and fxl mobility d/t decreased safety awareness, but does not require physical assistance and demos good stability. Pt participates in grooming and bathing tasks standing sink-side with CGA initially and progresses to SUPV/SBA level. Pt returned to chair at end of session. RN aware that pt had pulled off all lines and leads with exception of foley catheter. Pt left with call light in reach. No further OT needs detected at this time.     Follow Up Recommendations  No OT follow up;Supervision - Intermittent    Equipment Recommendations  None recommended by OT    Recommendations for Other Services       Precautions / Restrictions Precautions Precautions: Fall Restrictions Weight Bearing Restrictions: No      Mobility Bed Mobility Overal bed mobility: Independent                  Transfers Overall transfer level: Modified independent Equipment  used: None             General transfer comment: increased time, but no physical assist or AD. Some posterior lean upon initially coming to stand, but able to stabilize.    Balance Overall balance assessment: Mild deficits observed, not formally tested                                         ADL either performed or assessed with clinical judgement   ADL Overall ADL's : Needs assistance/impaired                                       General ADL Comments: Pt with decreased safety awareness and benefits from supervision, but does not require any physical assistance for sitting or standing ADLs.     Vision   Additional Comments: difficult to formally assess d/t cognition     Perception     Praxis      Pertinent Vitals/Pain Pain Assessment: Faces Faces Pain Scale: No hurt     Hand Dominance     Extremity/Trunk Assessment Upper Extremity Assessment Upper Extremity Assessment: Overall WFL for tasks assessed   Lower Extremity Assessment Lower Extremity Assessment: LLE deficits/detail LLE Deficits / Details: L foot noted to be swollen and slightly different shape then R foot. Pt noted to be walking  on inner plantar aspect of feet and have thickened outer aspect. Noted to be dragging L foot some wiht fxl mobility.       Communication Communication Communication: Other (comment) (garbled speech, poor/no dentition)   Cognition Arousal/Alertness: Awake/alert Behavior During Therapy: Agitated;Flat affect Overall Cognitive Status: No family/caregiver present to determine baseline cognitive functioning                                 General Comments: Pt is O to self and "hospital" but not time or situation. Pt is able to follow most simple commands, but demos some decreased insight into safety awareness. Pt requires some increased processing time. He is mostly flat, but pleasant for start of session, but does become agitated  gradually toward end of session. Perseverates on wanting to leave the hospital.   General Comments       Exercises     Shoulder Instructions      Home Living Family/patient expects to be discharged to:: Group home                                        Prior Functioning/Environment Level of Independence: Needs assistance  Gait / Transfers Assistance Needed: INDEP w/ no AD. Does mention some use of RW when asked by therapy team. ADL's / Homemaking Assistance Needed: group home assists with some IADLs, pt reports and demos that he can perform basic ADLs.            OT Problem List: Decreased safety awareness      OT Treatment/Interventions: Self-care/ADL training;Therapeutic activities    OT Goals(Current goals can be found in the care plan section) Acute Rehab OT Goals Patient Stated Goal: to go home OT Goal Formulation: All assessment and education complete, DC therapy  OT Frequency: Min 1X/week   Barriers to D/C:            Co-evaluation              AM-PAC OT "6 Clicks" Daily Activity     Outcome Measure Help from another person eating meals?: None Help from another person taking care of personal grooming?: None Help from another person toileting, which includes using toliet, bedpan, or urinal?: None Help from another person bathing (including washing, rinsing, drying)?: A Little (supervision) Help from another person to put on and taking off regular upper body clothing?: None Help from another person to put on and taking off regular lower body clothing?: None 6 Click Score: 23   End of Session Nurse Communication: Mobility status  Activity Tolerance: Patient tolerated treatment well Patient left: in chair;with call bell/phone within reach  OT Visit Diagnosis: Unsteadiness on feet (R26.81);Other symptoms and signs involving cognitive function                Time: 3500-9381 OT Time Calculation (min): 17 min Charges:  OT General  Charges $OT Visit: 1 Visit OT Evaluation $OT Eval Low Complexity: 1 Low  Rejeana Brock, MS, OTR/L ascom [phone] 03/15/21, 3:05 PM

## 2021-03-15 NOTE — ED Notes (Signed)
Pt refusing vitals and to be hooked up to monitoring equipment at this time. Pt is clam but refusing.

## 2021-03-15 NOTE — Progress Notes (Signed)
PROGRESS NOTE                                                                                                                                                                                                             Patient Demographics:    Henry Black, is a 65 y.o. male, DOB - [birth year], ZOX:[id number]  Outpatient Primary MD for the patient is Sherrie Mustache, MD    LOS - 0  Admit date - 03/14/2021    Chief Complaint  Patient presents with   Altered Mental Status       Brief Narrative (HPI from H&P)     Henry Black is a 65 y.o. male with medical history significant for hypertension and hepatitis, liver cirrhosis, undifferentiated schizophrenia and schizoaffective disorder as well as history of cocaine abuse, who presented to the emergency room with acute onset of altered mental status, he was discharged just 1 day prior from the hospital to a group home, initial work-up was unremarkable and he was admitted for AMS work-up.   Subjective:    Henry Black today has, No headache, No chest pain, No abdominal pain - No Nausea, No new weakness tingling or numbness, no SOB.   Assessment  & Plan :      Acute toxic encephalopathy - this seems to be related to drug abuse, his initial work-up including head CT, urine drug screen and ammonia level was unremarkable.  With supportive care and hydration he is waking up, this morning he told me that he smoked some crack given by a friend and it could have been mixed with something else that he does not know.  Interestingly his urine drug screen was unremarkable.  He still overall weak and still n.p.o., will change him to a soft diet, will have PT OT and speech evaluate him.  We will also request psych to evaluate him as he has multiple active psych issues.  2.  History of schizophrenia.  Psych has been consulted they will manage psych issues and medications.  3.  History of  cirrhosis.  Stable no acute issues ammonia level stable.  4.  HTN.  For now Norvasc and monitor.  5.  Dyslipidemia.  On home dose statin.  6.  Recent COVID-19 infection diagnosed on 03/09/2021.  Symptom-free comes off of quarantine on 03/19/2021.  Condition - Fair  Family Communication  :  None present  Code Status :  Full  Consults  :  Psych  PUD Prophylaxis :    Procedures  :     CT head.  Nonacute.      Disposition Plan  :    Status is: Observation  Dispo: The patient is from: Group home              Anticipated d/c is to: Group home              Patient currently is not medically stable to d/c.   Difficult to place patient No  DVT Prophylaxis  :    SCDs Start: 03/14/21 1523    Lab Results  Component Value Date   PLT 176 03/15/2021    Diet :  Diet Order             DIET SOFT Room service appropriate? Yes; Fluid consistency: Nectar Thick  Diet effective now                    Inpatient Medications  Scheduled Meds:  aspirin  300 mg Rectal Daily   Continuous Infusions:  lactated ringers     remdesivir 100 mg in NS 100 mL 100 mg (03/15/21 1023)   PRN Meds:.acetaminophen **OR** [DISCONTINUED] acetaminophen, albuterol, [DISCONTINUED] ondansetron **OR** ondansetron (ZOFRAN) IV  Antibiotics  :    Anti-infectives (From admission, onward)    Start     Dose/Rate Route Frequency Ordered Stop   03/15/21 1000  remdesivir 100 mg in sodium chloride 0.9 % 100 mL IVPB       See Hyperspace for full Linked Orders Report.   100 mg 200 mL/hr over 30 Minutes Intravenous Daily 03/14/21 1608 03/19/21 0959   03/14/21 2200  nirmatrelvir/ritonavir EUA (PAXLOVID) 3 tablet  Status:  Discontinued        3 tablet Oral 2 times daily 03/14/21 1436 03/14/21 1608   03/14/21 1700  remdesivir 200 mg in sodium chloride 0.9% 250 mL IVPB       See Hyperspace for full Linked Orders Report.   200 mg 580 mL/hr over 30 Minutes Intravenous Once 03/14/21 1608 03/14/21 1913    03/14/21 1430  cefTRIAXone (ROCEPHIN) 1 g in sodium chloride 0.9 % 100 mL IVPB        1 g 200 mL/hr over 30 Minutes Intravenous  Once 03/14/21 1418 03/14/21 1538   03/14/21 1430  azithromycin (ZITHROMAX) 500 mg in sodium chloride 0.9 % 250 mL IVPB        500 mg 250 mL/hr over 60 Minutes Intravenous  Once 03/14/21 1418 03/14/21 1552        Time Spent in minutes  30   Susa Raring M.D on 03/15/2021 at 12:21 PM  To page go to [email or web address]   [facility] -  Office  [phone]    See all Orders from today for further details    Objective:   Vitals:   03/15/21 0515 03/15/21 0640 03/15/21 0700 03/15/21 1000  BP:  101/68 98/66 105/66  Pulse:  79 78 79  Resp: 20 17 12 15   Temp:      TempSrc:      SpO2:  96% 96% 94%  Weight:      Height:        Wt Readings from Last 3 Encounters:  03/14/21 78.5 kg  03/09/21 78.1 kg  03/05/21 80.1 kg     Intake/Output Summary (Last 24  hours) at 03/15/2021 1221 Last data filed at 03/15/2021 0850 Gross per 24 hour  Intake 1500 ml  Output 900 ml  Net 600 ml     Physical Exam  Awake Alert, No new F.N deficits, Normal affect Carbon.AT,PERRAL Supple Neck,No JVD, No cervical lymphadenopathy appriciated.  Symmetrical Chest wall movement, Good air movement bilaterally, CTAB RRR,No Gallops,Rubs or new Murmurs, No Parasternal Heave +ve B.Sounds, Abd Soft, No tenderness, No organomegaly appriciated, No rebound - guarding or rigidity. No Cyanosis, Clubbing or edema, No new Rash or bruise      Data Review:    CBC Recent Labs  Lab 03/09/21 0430 03/14/21 1136 03/15/21 0532  WBC 6.6 9.6 7.9  HGB 11.3* 11.3* 9.8*  HCT 34.7* 33.1* 28.5*  PLT 153 226 176  MCV 92.3 88.5 89.9  MCH 30.1 30.2 30.9  MCHC 32.6 34.1 34.4  RDW 13.2 13.2 13.6  LYMPHSABS 1.4 1.9  --   MONOABS 0.6 1.1*  --   EOSABS 0.0 0.0  --   BASOSABS 0.0 0.0  --     Recent Labs  Lab 03/09/21 0411 03/14/21 1135 03/14/21 1136 03/15/21 0532  NA 134* 136  --  137   K 4.2 4.2  --  3.7  CL 100 102  --  105  CO2 27 25  --  27  GLUCOSE 90 105*  --  85  BUN 17 35*  --  22  CREATININE 0.95 1.37*  --  0.93  CALCIUM 9.3 9.7  --  8.9  AST 17 77*  --   --   ALT 14 26  --   --   ALKPHOS 46 53  --   --   BILITOT 0.7 0.6  --   --   ALBUMIN 3.5 3.9  --   --   PROCALCITON  --  <0.10  --   --   AMMONIA 23  --  29  --     ------------------------------------------------------------------------------------------------------------------ No results for input(s): CHOL, HDL, LDLCALC, TRIG, CHOLHDL, LDLDIRECT in the last 72 hours.  Lab Results  Component Value Date   HGBA1C 5.4 11/27/2020   ------------------------------------------------------------------------------------------------------------------ No results for input(s): TSH, T4TOTAL, T3FREE, THYROIDAB in the last 72 hours.  Invalid input(s): FREET3  Cardiac Enzymes No results for input(s): CKMB, TROPONINI, MYOGLOBIN in the last 168 hours.  Invalid input(s): CK ------------------------------------------------------------------------------------------------------------------ No results found for: BNP   Radiology Reports    CT Head Wo Contrast  Result Date: 03/14/2021 CLINICAL DATA:  Mental status change, unknown cause EXAM: CT HEAD WITHOUT CONTRAST TECHNIQUE: Contiguous axial images were obtained from the base of the skull through the vertex without intravenous contrast. COMPARISON:  CT 03/09/2021 FINDINGS: Brain: No evidence of acute intracranial hemorrhage or extra-axial collection.No evidence of mass lesion/concern mass effect.The ventricles are normal in size. Vascular: No hyperdense vessel or unexpected calcification. Skull: Normal. Negative for fracture or focal lesion. Sinuses/Orbits: No acute finding. Other: None. IMPRESSION: No acute intracranial abnormality. Electronically Signed   By: Caprice Renshaw M.D.   On: 03/14/2021 13:53     DG Chest Port 1 View  Result Date: 03/14/2021 CLINICAL  DATA:  Altered mental status. EXAM: PORTABLE CHEST 1 VIEW COMPARISON:  03/09/2021 FINDINGS: Heart size appears normal. Low lung volumes. No pleural effusion or edema. Atelectasis and airspace opacities are identified within the left lower lobe, similar to the previous exam. Subsegmental atelectasis within the right base is new from previous exam. IMPRESSION: 1. Persistent left lower lobe atelectasis and airspace opacities. 2.  New right base subsegmental atelectasis. Electronically Signed   By: Signa Kellaylor  Stroud M.D.   On: 03/14/2021 12:46

## 2021-03-15 NOTE — ED Notes (Signed)
Pt is asleep at this time. Equal rise and fall of the chest and on the monitor.

## 2021-03-15 NOTE — Evaluation (Signed)
Physical Therapy Evaluation Patient Details Name: Henry Black MRN: [id number] DOB: [birth year] Today's Date: 03/15/2021   History of Present Illness  Pt is a 65 y/o M admitted on 03/14/21 with acute onset AMS; pt was discharged 1 day prior to a group home. Pt is being treated for acute toxic encephalopathy. PMH: HTN, hepatitis, liver cirrhosis, undifferentiated schizophrenia & schizoaffective disorder, cocaine abuse  Clinical Impression  Pt seen for PT evaluation with co-tx with OT. Pt demonstrates impaired cognition but follows simple commands fairly well during session. Pt is able to ambulate within room without AD but HHA (+2 fade to +1) with CGA with shuffled gait pattern as noted below. Pt without overt LOB while standing at sink to bathe (pt noted to have blood on abdomen 2/2 pulling out IV & nurse aware; educated pt on not to pull out foley). Pt becoming irritable towards end of session. Recommend pt d/c with HHPT f/u & 24 hr supervision. Will continue to follow pt acutely to progress gait & address high level balance.     Follow Up Recommendations Home health PT;Supervision/Assistance - 24 hour    Equipment Recommendations  Rolling walker with 5" wheels    Recommendations for Other Services       Precautions / Restrictions Precautions Precautions: Fall Restrictions Weight Bearing Restrictions: No      Mobility  Bed Mobility Overal bed mobility: Independent                  Transfers Overall transfer level: Modified independent Equipment used: None             General transfer comment: sit<>stand without AD  Ambulation/Gait Ambulation/Gait assistance: Min guard Gait Distance (Feet): 15 Feet (+ 20 ft) Assistive device: 2 person hand held assist;1 person hand held assist Gait Pattern/deviations: Decreased step length - left;Decreased step length - right;Decreased dorsiflexion - right;Decreased dorsiflexion - left;Decreased stride length;Shuffle Gait  velocity: decreased      Stairs            Wheelchair Mobility    Modified Rankin (Stroke Patients Only)       Balance Overall balance assessment: Mild deficits observed, not formally tested Sitting-balance support: Feet supported Sitting balance-Leahy Scale: Good     Standing balance support: No upper extremity supported;During functional activity Standing balance-Leahy Scale: Fair Standing balance comment: Pt able to stand at sink & bath with close supervision without LOB.                             Pertinent Vitals/Pain Pain Assessment: No/denies pain Faces Pain Scale: No hurt    Home Living Family/patient expects to be discharged to:: Group home                      Prior Function Level of Independence: Needs assistance   Gait / Transfers Assistance Needed: INDEP w/ no AD. Does mention some use of RW when asked by therapy team.  ADL's / Homemaking Assistance Needed: group home assists with some IADLs, pt reports and demos that he can perform basic ADLs.        Hand Dominance        Extremity/Trunk Assessment   Upper Extremity Assessment Upper Extremity Assessment: Overall WFL for tasks assessed    Lower Extremity Assessment Lower Extremity Assessment: Generalized weakness LLE Deficits / Details: L foot noted to be swollen and slightly different shape then R foot. Pt noted to  be walking on inner plantar aspect of feet and have thickened outer aspect. Noted to be dragging L foot some wiht fxl mobility.       Communication   Communication: Other (comment) (garbled speech, poor/no dentention)  Cognition Arousal/Alertness: Awake/alert Behavior During Therapy: Restless;Flat affect Overall Cognitive Status: No family/caregiver present to determine baseline cognitive functioning                                 General Comments: Pt is O to self and "hospital" but not time or situation. Pt is able to follow most simple  commands, but demos some decreased insight into safety awareness. Pt requires some increased processing time. He is mostly flat, but pleasant for start of session, but does become irritated gradually toward end of session. Perseverates on wanting to leave the hospital.      General Comments      Exercises     Assessment/Plan    PT Assessment Patient needs continued PT services  PT Problem List Decreased strength;Decreased mobility;Decreased safety awareness;Decreased balance;Decreased knowledge of use of DME;Decreased activity tolerance;Decreased cognition       PT Treatment Interventions Therapeutic exercise;DME instruction;Gait training;Balance training;Stair training;Neuromuscular re-education;Functional mobility training;Cognitive remediation;Therapeutic activities;Patient/family education    PT Goals (Current goals can be found in the Care Plan section)  Acute Rehab PT Goals Patient Stated Goal: to go home PT Goal Formulation: With patient Time For Goal Achievement: 03/29/21 Potential to Achieve Goals: Fair    Frequency Min 2X/week   Barriers to discharge Decreased caregiver support      Co-evaluation PT/OT/SLP Co-Evaluation/Treatment: Yes Reason for Co-Treatment: Necessary to address cognition/behavior during functional activity;For patient/therapist safety PT goals addressed during session: Mobility/safety with mobility;Balance         AM-PAC PT "6 Clicks" Mobility  Outcome Measure Help needed turning from your back to your side while in a flat bed without using bedrails?: None Help needed moving from lying on your back to sitting on the side of a flat bed without using bedrails?: None Help needed moving to and from a bed to a chair (including a wheelchair)?: A Little Help needed standing up from a chair using your arms (e.g., wheelchair or bedside chair)?: None Help needed to walk in hospital room?: A Little Help needed climbing 3-5 steps with a railing? : A  Little 6 Click Score: 21    End of Session   Activity Tolerance: Patient tolerated treatment well Patient left: in bed;with call bell/phone within reach Nurse Communication: Mobility status PT Visit Diagnosis: Unsteadiness on feet (R26.81);Muscle weakness (generalized) (M62.81)    Time: 3419-6222 PT Time Calculation (min) (ACUTE ONLY): 17 min   Charges:   PT Evaluation $PT Eval Moderate Complexity: 1 Mod          Aleda Grana, PT, DPT 03/15/21, 3:46 PM   Sandi Mariscal 03/15/2021, 3:40 PM

## 2021-03-15 NOTE — ED Notes (Signed)
RN aware of bed assigned 

## 2021-03-15 NOTE — ED Notes (Deleted)
Pt one assist OOB to restroom, pt maintains steady gait.

## 2021-03-15 NOTE — ED Notes (Signed)
Pt is asleep with equal rise and fall of the chest. Pt is on the monitor.

## 2021-03-15 NOTE — ED Notes (Signed)
Pt resting comfortably at this time. NAD noted. Pt denies any further needs at this time. Call bell in reach.

## 2021-03-15 NOTE — ED Notes (Signed)
[facility] patient medical admission

## 2021-03-15 NOTE — ED Notes (Signed)
Pt currently eating with no issue, no difficulty swallowing noted.

## 2021-03-15 NOTE — Consult Note (Addendum)
[facility] Face-to-Face Psychiatry Consult   Reason for Consult:  AMS Referring Physician:  EDP Patient Identification: Henry Black MRN:  [id number] Principal Diagnosis: AMS (altered mental status) Diagnosis:  Principal Problem:   AMS (altered mental status) Active Problems:   Schizophrenia, undifferentiated (HCC)   Hepatic cirrhosis (HCC)   Essential hypertension   COVID-19 virus infection   Total Time spent with patient: 1 hour  Subjective:   Henry Black is a 65 y.o. male patient admitted with AMS.  "I'm alright.  I forgot everything.  I fell walking the streets, just walking and passed out."  HPI:  65 yo male who presented to the ED with AMS after discharging the day before.  On admission, he had AMS worse then on 8/13 when this provider assessed him on his last ED admission.  He aroused yesterday to sternal rub only.  Today, he is talkative and interacting.  He reports he was walking in the streets prior to admission and passed out.  Denies using any drugs, specifically cocaine (history of cocaine use d/o).  Denies suicidal/homicidal ideations, hallucinations, or other concerning psychiatric issues.  The medical team evaluated him for medical purposes and admitted him to the inpatient unit.    Later this afternoon, he was walking in his room after removing his IVs and attempting to remove his foley catheter.  Per the EDPs request, this provider IVC'd him to get the medical assistance needed.  He will transfer shortly to the medical unit.  Psychiatric medications restarted.  Past Psychiatric History: schizophrenia, cocaine use disorder  Risk to Self:  risk of medical concerns for leaving before medical clearance Risk to Others:  none Prior Inpatient Therapy:  several Prior Outpatient Therapy:  yes  Past Medical History:  Past Medical History:  Diagnosis Date   Hepatitis    Hypertension     Past Surgical History:  Procedure Laterality Date   ESOPHAGOGASTRODUODENOSCOPY (EGD)  WITH PROPOFOL N/A 12/21/2019   Procedure: ESOPHAGOGASTRODUODENOSCOPY (EGD) WITH PROPOFOL;  Surgeon: Toney Reil, MD;  Location: [facility] ENDOSCOPY;  Service: Gastroenterology;  Laterality: N/A;   Family History: History reviewed. No pertinent family history. Family Psychiatric  History: unknown Social History:  Social History   Substance and Sexual Activity  Alcohol Use Not Currently   Alcohol/week: 1.0 standard drink   Types: 1 Standard drinks or equivalent per week     Social History   Substance and Sexual Activity  Drug Use Not Currently   Types: "Crack" cocaine    Social History   Socioeconomic History   Marital status: Single    Spouse name: Not on file   Number of children: Not on file   Years of education: Not on file   Highest education level: Not on file  Occupational History   Not on file  Tobacco Use   Smoking status: Every Day    Packs/day: 0.50    Years: 46.00    Pack years: 23.00    Types: Cigarettes   Smokeless tobacco: Never  Vaping Use   Vaping Use: Never used  Substance and Sexual Activity   Alcohol use: Not Currently    Alcohol/week: 1.0 standard drink    Types: 1 Standard drinks or equivalent per week   Drug use: Not Currently    Types: "Crack" cocaine   Sexual activity: Not on file  Other Topics Concern   Not on file  Social History Narrative   Not on file   Social Determinants of Health   Financial Resource Strain:  Not on file  Food Insecurity: Not on file  Transportation Needs: Not on file  Physical Activity: Not on file  Stress: Not on file  Social Connections: Not on file   Additional Social History:    Allergies:   Allergies  Allergen Reactions   Penicillins Rash    Has patient had a PCN reaction causing immediate rash, facial/tongue/throat swelling, SOB or lightheadedness with hypotension: Unknown Has patient had a PCN reaction causing severe rash involving mucus membranes or skin necrosis: Unknown Has patient had a PCN  reaction that required hospitalization: Unknown Has patient had a PCN reaction occurring within the last 10 years: Unknown If all of the above answers are "NO", then may proceed with Cephalosporin use.     Labs:  Results for orders placed or performed during the hospital encounter of 03/14/21 (from the past 48 hour(s))  Comprehensive metabolic panel     Status: Abnormal   Collection Time: 03/14/21 11:35 AM  Result Value Ref Range   Sodium 136 135 - 145 mmol/L   Potassium 4.2 3.5 - 5.1 mmol/L   Chloride 102 98 - 111 mmol/L   CO2 25 22 - 32 mmol/L   Glucose, Bld 105 (H) 70 - 99 mg/dL    Comment: Glucose reference range applies only to samples taken after fasting for at least 8 hours.   BUN 35 (H) 8 - 23 mg/dL   Creatinine, Ser 1.61 (H) 0.61 - 1.24 mg/dL   Calcium 9.7 8.9 - 09.6 mg/dL   Total Protein 7.8 6.5 - 8.1 g/dL   Albumin 3.9 3.5 - 5.0 g/dL   AST 77 (H) 15 - 41 U/L   ALT 26 0 - 44 U/L   Alkaline Phosphatase 53 38 - 126 U/L   Total Bilirubin 0.6 0.3 - 1.2 mg/dL   GFR, Estimated 58 (L) >60 mL/min    Comment: (NOTE) Calculated using the CKD-EPI Creatinine Equation (2021)    Anion gap 9 5 - 15    Comment: Performed at [facility], [address] [place], [place] [address]  Ethanol     Status: None   Collection Time: 03/14/21 11:35 AM  Result Value Ref Range   Alcohol, Ethyl (B) <10 <10 mg/dL    Comment: (NOTE) Lowest detectable limit for serum alcohol is 10 mg/dL.  For medical purposes only. Performed at [facility], [address] [place], [place] [address]   Valproic acid level     Status: None   Collection Time: 03/14/21 11:35 AM  Result Value Ref Range   Valproic Acid Lvl 59 50.0 - 100.0 ug/mL    Comment: Performed at [facility], [address] [place], [place] [address]  Procalcitonin - Baseline     Status: None   Collection Time: 03/14/21 11:35 AM  Result Value Ref Range   Procalcitonin <0.10 ng/mL    Comment:         Interpretation: PCT (Procalcitonin) <= 0.5 ng/mL: Systemic infection (sepsis) is not likely. Local bacterial infection is possible. (NOTE)       Sepsis PCT Algorithm           Lower Respiratory Tract                                      Infection PCT Algorithm    ----------------------------     ----------------------------         PCT <  0.25 ng/mL                PCT < 0.10 ng/mL          Strongly encourage             Strongly discourage   discontinuation of antibiotics    initiation of antibiotics    ----------------------------     -----------------------------       PCT 0.25 - 0.50 ng/mL            PCT 0.10 - 0.25 ng/mL               OR       >80% decrease in PCT            Discourage initiation of                                            antibiotics      Encourage discontinuation           of antibiotics    ----------------------------     -----------------------------         PCT >= 0.50 ng/mL              PCT 0.26 - 0.50 ng/mL               AND        <80% decrease in PCT             Encourage initiation of                                             antibiotics       Encourage continuation           of antibiotics    ----------------------------     -----------------------------        PCT >= 0.50 ng/mL                  PCT > 0.50 ng/mL               AND         increase in PCT                  Strongly encourage                                      initiation of antibiotics    Strongly encourage escalation           of antibiotics                                     -----------------------------                                           PCT <= 0.25 ng/mL  OR                                        > 80% decrease in PCT                                      Discontinue / Do not initiate                                             antibiotics  Performed at [facility], [address] [place], [place]  [id number]   Ammonia     Status: None   Collection Time: 03/14/21 11:36 AM  Result Value Ref Range   Ammonia 29 9 - 35 umol/L    Comment: Performed at [facility], [address] [place], [place] [address]  CBC with Differential     Status: Abnormal   Collection Time: 03/14/21 11:36 AM  Result Value Ref Range   WBC 9.6 4.0 - 10.5 K/uL   RBC 3.74 (L) 4.22 - 5.81 MIL/uL   Hemoglobin 11.3 (L) 13.0 - 17.0 g/dL   HCT 09.833.1 (L) 11.939.0 - 14.752.0 %   MCV 88.5 80.0 - 100.0 fL   MCH 30.2 26.0 - 34.0 pg   MCHC 34.1 30.0 - 36.0 g/dL   RDW 82.913.2 56.211.5 - 13.015.5 %   Platelets 226 150 - 400 K/uL   nRBC 0.0 0.0 - 0.2 %   Neutrophils Relative % 69 %   Neutro Abs 6.6 1.7 - 7.7 K/uL   Lymphocytes Relative 20 %   Lymphs Abs 1.9 0.7 - 4.0 K/uL   Monocytes Relative 11 %   Monocytes Absolute 1.1 (H) 0.1 - 1.0 K/uL   Eosinophils Relative 0 %   Eosinophils Absolute 0.0 0.0 - 0.5 K/uL   Basophils Relative 0 %   Basophils Absolute 0.0 0.0 - 0.1 K/uL   Immature Granulocytes 0 %   Abs Immature Granulocytes 0.03 0.00 - 0.07 K/uL    Comment: Performed at [facility], [address] [place], [place] [address]  Troponin I (High Sensitivity)     Status: None   Collection Time: 03/14/21 11:36 AM  Result Value Ref Range   Troponin I (High Sensitivity) 11 <18 ng/L    Comment: (NOTE) Elevated high sensitivity troponin I (hsTnI) values and significant  changes across serial measurements may suggest ACS but many other  chronic and acute conditions are known to elevate hsTnI results.  Refer to the "Links" section for chest pain algorithms and additional  guidance. Performed at [facility], [address] [place], [place] [address]   Blood gas, venous     Status: Abnormal   Collection Time: 03/14/21  2:28 PM  Result Value Ref Range   pH, Ven 7.39 7.250 - 7.430   pCO2, Ven 43 (L) 44.0 - 60.0 mmHg   pO2, Ven 73.0 (H) 32.0 - 45.0 mmHg   Bicarbonate 26.0 20.0 - 28.0 mmol/L   Acid-Base  Excess 0.8 0.0 - 2.0 mmol/L   O2 Saturation 94.3 %   Patient temperature 37.0    Collection site VEIN    Sample type VENIPUNCTURE     Comment: Performed at [facility], [address] [place], [place] [address]  Urine Drug Screen, Qualitative     Status: None   Collection Time: 03/14/21  6:45 PM  Result Value Ref Range   Tricyclic, Ur Screen NONE DETECTED NONE DETECTED   Amphetamines, Ur Screen NONE DETECTED NONE DETECTED   MDMA (Ecstasy)Ur Screen NONE DETECTED NONE DETECTED   Cocaine Metabolite,Ur [place] NONE DETECTED NONE DETECTED   Opiate, Ur Screen NONE DETECTED NONE DETECTED   Phencyclidine (PCP) Ur S NONE DETECTED NONE DETECTED   Cannabinoid 50 Ng, Ur [place] NONE DETECTED NONE DETECTED   Barbiturates, Ur Screen NONE DETECTED NONE DETECTED   Benzodiazepine, Ur Scrn NONE DETECTED NONE DETECTED   Methadone Scn, Ur NONE DETECTED NONE DETECTED    Comment: (NOTE) Tricyclics + metabolites, urine    Cutoff 1000 ng/mL Amphetamines + metabolites, urine  Cutoff 1000 ng/mL MDMA (Ecstasy), urine              Cutoff 500 ng/mL Cocaine Metabolite, urine          Cutoff 300 ng/mL Opiate + metabolites, urine        Cutoff 300 ng/mL Phencyclidine (PCP), urine         Cutoff 25 ng/mL Cannabinoid, urine                 Cutoff 50 ng/mL Barbiturates + metabolites, urine  Cutoff 200 ng/mL Benzodiazepine, urine              Cutoff 200 ng/mL Methadone, urine                   Cutoff 300 ng/mL  The urine drug screen provides only a preliminary, unconfirmed analytical test result and should not be used for non-medical purposes. Clinical consideration and professional judgment should be applied to any positive drug screen result due to possible interfering substances. A more specific alternate chemical method must be used in order to obtain a confirmed analytical result. Gas chromatography / mass spectrometry (GC/MS) is the preferred confirm atory method. Performed at [facility], [address] [place], [place] [address]   Urinalysis, Complete w Microscopic Urine, Catheterized     Status: Abnormal   Collection Time: 03/14/21  6:45 PM  Result Value Ref Range   Color, Urine YELLOW (A) YELLOW   APPearance CLEAR (A) CLEAR   Specific Gravity, Urine 1.018 1.005 - 1.030   pH 6.0 5.0 - 8.0   Glucose, UA NEGATIVE NEGATIVE mg/dL   Hgb urine dipstick MODERATE (A) NEGATIVE   Bilirubin Urine NEGATIVE NEGATIVE   Ketones, ur NEGATIVE NEGATIVE mg/dL   Protein, ur NEGATIVE NEGATIVE mg/dL   Nitrite NEGATIVE NEGATIVE   Leukocytes,Ua NEGATIVE NEGATIVE   RBC / HPF 0-5 0 - 5 RBC/hpf   WBC, UA 0-5 0 - 5 WBC/hpf   Bacteria, UA RARE (A) NONE SEEN   Squamous Epithelial / LPF 0-5 0 - 5   Mucus PRESENT    Hyaline Casts, UA PRESENT     Comment: Performed at [facility], [address] [place], [place] [address]  CBC     Status: Abnormal   Collection Time: 03/15/21  5:32 AM  Result Value Ref Range   WBC 7.9 4.0 - 10.5 K/uL   RBC 3.17 (L) 4.22 - 5.81 MIL/uL   Hemoglobin 9.8 (L) 13.0 - 17.0 g/dL   HCT 21.3 (L) 08.6 - 57.8 %   MCV 89.9 80.0 - 100.0 fL   MCH 30.9 26.0 - 34.0  pg   MCHC 34.4 30.0 - 36.0 g/dL   RDW 99.3 71.6 - 96.7 %   Platelets 176 150 - 400 K/uL   nRBC 0.0 0.0 - 0.2 %    Comment: Performed at [facility], [address] [place], [place] [address]  Basic metabolic panel     Status: None   Collection Time: 03/15/21  5:32 AM  Result Value Ref Range   Sodium 137 135 - 145 mmol/L   Potassium 3.7 3.5 - 5.1 mmol/L   Chloride 105 98 - 111 mmol/L   CO2 27 22 - 32 mmol/L   Glucose, Bld 85 70 - 99 mg/dL    Comment: Glucose reference range applies only to samples taken after fasting for at least 8 hours.   BUN 22 8 - 23 mg/dL   Creatinine, Ser 0.17 0.61 - 1.24 mg/dL   Calcium 8.9 8.9 - 51.0 mg/dL   GFR, Estimated >25 >85 mL/min    Comment: (NOTE) Calculated using the CKD-EPI Creatinine Equation (2021)    Anion gap 5 5 - 15    Comment: Performed  at [facility], [address] [place], [place] [address]    Current Facility-Administered Medications  Medication Dose Route Frequency Provider Last Rate Last Admin   acetaminophen (TYLENOL) tablet 650 mg  650 mg Oral Q6H PRN Agbata, Tochukwu, MD       albuterol (VENTOLIN HFA) 108 (90 Base) MCG/ACT inhaler 2 puff  2 puff Inhalation Q6H PRN Leroy Sea, MD       amLODipine (NORVASC) tablet 10 mg  10 mg Oral Daily Leroy Sea, MD       aspirin suppository 300 mg  300 mg Rectal Daily Agbata, Tochukwu, MD   300 mg at 03/15/21 1021   atorvastatin (LIPITOR) tablet 20 mg  20 mg Oral QPC supper Leroy Sea, MD       heparin injection 5,000 Units  5,000 Units Subcutaneous Q8H Leroy Sea, MD       lactated ringers infusion   Intravenous Continuous Leroy Sea, MD   Held at 03/15/21 1433   ondansetron (ZOFRAN) injection 4 mg  4 mg Intravenous Q6H PRN Agbata, Tochukwu, MD       remdesivir 100 mg in sodium chloride 0.9 % 100 mL IVPB  100 mg Intravenous Daily Agbata, Tochukwu, MD   Stopped at 03/15/21 1224   Current Outpatient Medications  Medication Sig Dispense Refill   amLODipine (NORVASC) 5 MG tablet Take 1 tablet (5 mg total) by mouth daily. 30 tablet 1   atorvastatin (LIPITOR) 20 MG tablet Take 20 mg by mouth daily.     benztropine (COGENTIN) 1 MG tablet Take 1 tablet (1 mg total) by mouth 2 (two) times daily. 60 tablet 0   clonazePAM (KLONOPIN) 0.5 MG tablet Take 1 tablet (0.5 mg total) by mouth 2 (two) times daily. 30 tablet 0   divalproex (DEPAKOTE) 500 MG DR tablet Take 1 tablet (500 mg total) by mouth 2 (two) times daily. 60 tablet 1   haloperidol (HALDOL) 10 MG tablet Take 1 tablet (10 mg total) by mouth in the morning AND 2 tablets (20 mg total) at bedtime. 90 tablet 1   losartan (COZAAR) 100 MG tablet Take 100 mg by mouth daily.      Musculoskeletal: Strength & Muscle Tone: within normal limits Gait & Station: normal Patient leans:  N/A   Psychiatric Specialty Exam:  Presentation  General Appearance: Casual  Eye Contact:Good  Speech:Normal Rate  Speech Volume:Normal  Handedness:Right   Mood and Affect  Mood: anxious, mild Affect:Congruent   Thought Process  Thought Processes:Goal Directed  Descriptions of Associations:Intact  Orientation:Full (Time, Place and Person)  Thought Content:Logical  History of Schizophrenia/Schizoaffective disorder:Yes  Duration of Psychotic Symptoms:Greater than six months  Hallucinations: None currently Ideas of Reference:None  Suicidal Thoughts: none Homicidal Thoughts: none  Sensorium  Memory:Immediate Fair; Recent Fair; Remote Poor  Judgment:  fair Insight: fair  Executive Functions  Concentration:Fair  Attention Span:Fair  Recall:Fair  Fund of Knowledge:Fair  Language:Fair   Psychomotor Activity  Psychomotor Activity:WDL  Assets  Assets:Communication Skills; Desire for Improvement; Financial Resources/Insurance; Housing; Leisure Time; Resilience; Social Support   Sleep  Sleep: Good  Physical Exam: Physical Exam Vitals and nursing note reviewed.  Constitutional:      Appearance: Normal appearance.  HENT:     Head: Normocephalic.     Nose: Nose normal.  Pulmonary:     Effort: Pulmonary effort is normal.  Musculoskeletal:        General: Normal range of motion.     Cervical back: Normal range of motion.  Neurological:     General: No focal deficit present.     Mental Status: He is alert and oriented to person, place, and time.  Psychiatric:        Attention and Perception: Attention and perception normal.        Mood and Affect: Mood is anxious.        Speech: Speech normal.        Behavior: Behavior normal. Behavior is cooperative.        Thought Content: Thought content normal.        Cognition and Memory: Cognition is impaired.        Judgment: Judgment is inappropriate.   Review of Systems  Psychiatric/Behavioral:   The patient is nervous/anxious.   All other systems reviewed and are negative. Blood pressure 120/77, pulse 81, temperature 98.6 F (37 C), temperature source Oral, resp. rate 16, height 6' (1.829 m), weight 78.5 kg, SpO2 96 %. Body mass index is 23.46 kg/m.  Treatment Plan Summary: Schizophrenia, undifferentiated: -Changed  Haldol 10 mg in the am and 20 mg in the pm to 10 mg BID -Continue Depakote 500 mg BID   EPS: -Continue Cogentin 1 mg BId   Anxiety: -Continue Klonopin 0.5 mg BID   Disposition:  No risk to others, psychiatric admission not needed, may return to his group home when he is medically cleared  Nanine Means, NP 03/15/2021 2:37 PM

## 2021-03-15 NOTE — ED Notes (Signed)
Pt resting at this time Pt is alert and arousable with light touch and voice. Pt is orientated to his name and was unsure of where he was, pt told he was in the hospital. Pt educated of why he is here at this time. Pt is calm and cooperative at this time. Pt denies further needs. Call bell in reach.

## 2021-03-16 LAB — CBC WITH DIFFERENTIAL/PLATELET
Abs Immature Granulocytes: 0.03 10*3/uL (ref 0.00–0.07)
Basophils Absolute: 0.1 10*3/uL (ref 0.0–0.1)
Basophils Relative: 1 %
Eosinophils Absolute: 0.5 10*3/uL (ref 0.0–0.5)
Eosinophils Relative: 5 %
HCT: 31.7 % — ABNORMAL LOW (ref 39.0–52.0)
Hemoglobin: 10.7 g/dL — ABNORMAL LOW (ref 13.0–17.0)
Immature Granulocytes: 0 %
Lymphocytes Relative: 31 %
Lymphs Abs: 3.2 10*3/uL (ref 0.7–4.0)
MCH: 30 pg (ref 26.0–34.0)
MCHC: 33.8 g/dL (ref 30.0–36.0)
MCV: 88.8 fL (ref 80.0–100.0)
Monocytes Absolute: 1.3 10*3/uL — ABNORMAL HIGH (ref 0.1–1.0)
Monocytes Relative: 13 %
Neutro Abs: 5.2 10*3/uL (ref 1.7–7.7)
Neutrophils Relative %: 50 %
Platelets: 204 10*3/uL (ref 150–400)
RBC: 3.57 MIL/uL — ABNORMAL LOW (ref 4.22–5.81)
RDW: 13.3 % (ref 11.5–15.5)
WBC: 10.3 10*3/uL (ref 4.0–10.5)
nRBC: 0 % (ref 0.0–0.2)

## 2021-03-16 LAB — COMPREHENSIVE METABOLIC PANEL
ALT: 28 U/L (ref 0–44)
AST: 62 U/L — ABNORMAL HIGH (ref 15–41)
Albumin: 2.9 g/dL — ABNORMAL LOW (ref 3.5–5.0)
Alkaline Phosphatase: 41 U/L (ref 38–126)
Anion gap: 7 (ref 5–15)
BUN: 18 mg/dL (ref 8–23)
CO2: 28 mmol/L (ref 22–32)
Calcium: 8.8 mg/dL — ABNORMAL LOW (ref 8.9–10.3)
Chloride: 99 mmol/L (ref 98–111)
Creatinine, Ser: 0.82 mg/dL (ref 0.61–1.24)
GFR, Estimated: >60 mL/min (ref >60)
Glucose, Bld: 82 mg/dL (ref 70–99)
Potassium: 3.9 mmol/L (ref 3.5–5.1)
Sodium: 134 mmol/L — ABNORMAL LOW (ref 135–145)
Total Bilirubin: 0.7 mg/dL (ref 0.3–1.2)
Total Protein: 6 g/dL — ABNORMAL LOW (ref 6.5–8.1)

## 2021-03-16 LAB — BRAIN NATRIURETIC PEPTIDE: B Natriuretic Peptide: 41.5 pg/mL (ref 0.0–100.0)

## 2021-03-16 LAB — MAGNESIUM: Magnesium: 1.8 mg/dL (ref 1.7–2.4)

## 2021-03-16 MED ORDER — SODIUM CHLORIDE 0.9% FLUSH
10.0000 mL | Freq: Two times a day (BID) | INTRAVENOUS | Status: DC
  Administered 2021-03-16 – 2021-03-18 (×3): 10 mL via INTRAVENOUS

## 2021-03-16 MED ORDER — CHLORHEXIDINE GLUCONATE CLOTH 2 % EX PADS: 6.0000 | MEDICATED_PAD | Freq: Every day | CUTANEOUS | Status: DC

## 2021-03-16 MED ORDER — ASPIRIN 81 MG PO CHEW
81.0000 mg | CHEWABLE_TABLET | Freq: Every day | ORAL | Status: DC
  Administered 2021-03-16 – 2021-03-18 (×3): 81 mg via ORAL
  Filled 2021-03-16 (×3): qty 1

## 2021-03-16 NOTE — TOC Progression Note (Signed)
Transition of Care [facility]) - Progression Note    Patient Details  Name: Henry Black MRN: [id number] Date of Birth: 1956/01/01  Transition of Care [facility] CM/SW Contact  Caryn Section, RN Phone Number: 03/16/2021, 11:26 AM  Clinical Narrative:   Weston Anna, Group Home Facilitator about patient.  She states that she cannot accept patient until he is at his baseline and is not able to accommodate patient today, with IVC orders just being lifted, COVID + status and HH recommendations.  RNCM contacted patient in room, patient states that he wants to leave, but agrees to stay until group home can accommodate his return.  TOC contact information given to patient and Group home.  TOC will follow to discharge.         Expected Discharge Plan and Services           Expected Discharge Date: 03/16/21                                     Social Determinants of Health (SDOH) Interventions    Readmission Risk Interventions No flowsheet data found.

## 2021-03-16 NOTE — Progress Notes (Addendum)
PROGRESS NOTE                                                                                                                                                                                                             Patient Demographics:    Henry Black, is a 65 y.o. male, DOB - [birth year], [id number]  Outpatient Primary MD for the patient is Sherrie MustacheJadali, Fayegh, MD    LOS - 1  Admit date - 03/14/2021    Chief Complaint  Patient presents with   Altered Mental Status       Brief Narrative (HPI from H&P)     Henry JanusDonald Heyne is a 65 y.o. male with medical history significant for hypertension and hepatitis, liver cirrhosis, undifferentiated schizophrenia and schizoaffective disorder as well as history of cocaine abuse, who presented to the emergency room with acute onset of altered mental status, he was discharged just 1 day prior from the hospital to a group home, initial work-up was unremarkable and he was admitted for AMS work-up.   Subjective:   Patient in bed, appears comfortable, denies any headache, no fever, no chest pain or pressure, no shortness of breath , no abdominal pain. No new focal weakness.   Assessment  & Plan :      Acute toxic encephalopathy - this seems to be related to drug abuse, his initial work-up including head CT, urine drug screen and ammonia level was unremarkable.  With supportive care and hydration is much better and admitted to smoking some substance that was given to him by a friend, urine drug screen was negative.  He is now much improved, has been seen by PT OT, cleared by psych, medically stable for discharge however currently does not have a safe disposition as per social worker his group home will not take him back, continue supportive care and continue to look for safe disposition.  Social work is following.  2.  History of schizophrenia.  Psych has been consulted they will manage psych  issues and medications.  He was initially IVC in the ER but that has been rescinded.  3.  History of cirrhosis.  Stable no acute issues ammonia level stable.  4.  HTN.  For now Norvasc and monitor.  5.  Dyslipidemia.  On home dose statin.  6.  Recent COVID-19 infection diagnosed on 03/09/2021.  Symptom-free comes off of quarantine on 03/19/2021.      Condition - Fair  Family Communication  :  None present  Code Status :  Full  Consults  :  Psych  PUD Prophylaxis :    Procedures  :     CT head.  Nonacute.      Disposition Plan  :    Status is: Observation  Dispo: The patient is from: Group home              Anticipated d/c is to: Group home              Patient currently is not medically stable to d/c.   Difficult to place patient No  DVT Prophylaxis  :    heparin injection 5,000 Units Start: 03/15/21 1400 SCDs Start: 03/14/21 1523    Lab Results  Component Value Date   PLT 204 03/16/2021    Diet :  Diet Order             DIET SOFT Room service appropriate? Yes; Fluid consistency: Thin  Diet effective now                    Inpatient Medications  Scheduled Meds:  amLODipine  10 mg Oral Daily   aspirin  81 mg Oral Daily   atorvastatin  20 mg Oral QPC supper   benztropine  1 mg Oral BID   Chlorhexidine Gluconate Cloth  6 each Topical Daily   clonazePAM  0.5 mg Oral BID   divalproex  500 mg Oral Q12H   haloperidol  10 mg Oral BID   heparin injection (subcutaneous)  5,000 Units Subcutaneous Q8H   Continuous Infusions:  remdesivir 100 mg in NS 100 mL 100 mg (03/16/21 0936)   PRN Meds:.acetaminophen **OR** [DISCONTINUED] acetaminophen, albuterol, [DISCONTINUED] ondansetron **OR** ondansetron (ZOFRAN) IV  Antibiotics  :    Anti-infectives (From admission, onward)    Start     Dose/Rate Route Frequency Ordered Stop   03/15/21 1000  remdesivir 100 mg in sodium chloride 0.9 % 100 mL IVPB       See Hyperspace for full Linked Orders Report.   100  mg 200 mL/hr over 30 Minutes Intravenous Daily 03/14/21 1608 03/19/21 0959   03/14/21 2200  nirmatrelvir/ritonavir EUA (PAXLOVID) 3 tablet  Status:  Discontinued        3 tablet Oral 2 times daily 03/14/21 1436 03/14/21 1608   03/14/21 1700  remdesivir 200 mg in sodium chloride 0.9% 250 mL IVPB       See Hyperspace for full Linked Orders Report.   200 mg 580 mL/hr over 30 Minutes Intravenous Once 03/14/21 1608 03/14/21 1913   03/14/21 1430  cefTRIAXone (ROCEPHIN) 1 g in sodium chloride 0.9 % 100 mL IVPB        1 g 200 mL/hr over 30 Minutes Intravenous  Once 03/14/21 1418 03/14/21 1538   03/14/21 1430  azithromycin (ZITHROMAX) 500 mg in sodium chloride 0.9 % 250 mL IVPB        500 mg 250 mL/hr over 60 Minutes Intravenous  Once 03/14/21 1418 03/14/21 1552        Time Spent in minutes  30   Susa Raring M.D on 03/16/2021 at 10:14 AM  To page go to [email or web address]   [facility] -  Office  [phone]    See all Orders from today for further details    Objective:   Vitals:   03/15/21 1845 03/15/21 1916  03/16/21 0516 03/16/21 0819  BP: (!) 145/71 115/73 114/69 115/74  Pulse: (!) 103 88 75 74  Resp: 16 17 17    Temp: 99.2 F (37.3 C) 99.3 F (37.4 C) 98.4 F (36.9 C) 98.4 F (36.9 C)  TempSrc: Oral Oral Oral Oral  SpO2: 97% 98% 99% 99%  Weight:      Height:        Wt Readings from Last 3 Encounters:  03/14/21 78.5 kg  03/09/21 78.1 kg  03/05/21 80.1 kg     Intake/Output Summary (Last 24 hours) at 03/16/2021 1014 Last data filed at 03/16/2021 0950 Gross per 24 hour  Intake 615 ml  Output 1425 ml  Net -810 ml     Physical Exam  Awake Alert, No new F.N deficits,  [place].AT,PERRAL Supple Neck,No JVD, No cervical lymphadenopathy appriciated.  Symmetrical Chest wall movement, Good air movement bilaterally, CTAB RRR,No Gallops, Rubs or new Murmurs, No Parasternal Heave +ve B.Sounds, Abd Soft, No tenderness, No organomegaly appriciated, No rebound - guarding  or rigidity. No Cyanosis, Clubbing or edema, No new Rash or bruise     Data Review:    CBC Recent Labs  Lab 03/14/21 1136 03/15/21 0532 03/16/21 0527  WBC 9.6 7.9 10.3  HGB 11.3* 9.8* 10.7*  HCT 33.1* 28.5* 31.7*  PLT 226 176 204  MCV 88.5 89.9 88.8  MCH 30.2 30.9 30.0  MCHC 34.1 34.4 33.8  RDW 13.2 13.6 13.3  LYMPHSABS 1.9  --  3.2  MONOABS 1.1*  --  1.3*  EOSABS 0.0  --  0.5  BASOSABS 0.0  --  0.1    Recent Labs  Lab 03/14/21 1135 03/14/21 1136 03/15/21 0532 03/16/21 0527  NA 136  --  137 134*  K 4.2  --  3.7 3.9  CL 102  --  105 99  CO2 25  --  27 28  GLUCOSE 105*  --  85 82  BUN 35*  --  22 18  CREATININE 1.37*  --  0.93 0.82  CALCIUM 9.7  --  8.9 8.8*  AST 77*  --   --  62*  ALT 26  --   --  28  ALKPHOS 53  --   --  41  BILITOT 0.6  --   --  0.7  ALBUMIN 3.9  --   --  2.9*  MG  --   --   --  1.8  PROCALCITON <0.10  --   --   --   AMMONIA  --  29  --   --   BNP  --   --   --  41.5    ------------------------------------------------------------------------------------------------------------------ No results for input(s): CHOL, HDL, LDLCALC, TRIG, CHOLHDL, LDLDIRECT in the last 72 hours.  Lab Results  Component Value Date   HGBA1C 5.4 11/27/2020   ------------------------------------------------------------------------------------------------------------------ No results for input(s): TSH, T4TOTAL, T3FREE, THYROIDAB in the last 72 hours.  Invalid input(s): FREET3  Cardiac Enzymes No results for input(s): CKMB, TROPONINI, MYOGLOBIN in the last 168 hours.  Invalid input(s): CK ------------------------------------------------------------------------------------------------------------------    Component Value Date/Time   BNP 41.5 03/16/2021 0527     Radiology Reports    CT Head Wo Contrast  Result Date: 03/14/2021 CLINICAL DATA:  Mental status change, unknown cause EXAM: CT HEAD WITHOUT CONTRAST TECHNIQUE: Contiguous axial images were  obtained from the base of the skull through the vertex without intravenous contrast. COMPARISON:  CT 03/09/2021 FINDINGS: Brain: No evidence of acute intracranial hemorrhage or extra-axial collection.No  evidence of mass lesion/concern mass effect.The ventricles are normal in size. Vascular: No hyperdense vessel or unexpected calcification. Skull: Normal. Negative for fracture or focal lesion. Sinuses/Orbits: No acute finding. Other: None. IMPRESSION: No acute intracranial abnormality. Electronically Signed   By: Caprice Renshaw M.D.   On: 03/14/2021 13:53     DG Chest Port 1 View  Result Date: 03/14/2021 CLINICAL DATA:  Altered mental status. EXAM: PORTABLE CHEST 1 VIEW COMPARISON:  03/09/2021 FINDINGS: Heart size appears normal. Low lung volumes. No pleural effusion or edema. Atelectasis and airspace opacities are identified within the left lower lobe, similar to the previous exam. Subsegmental atelectasis within the right base is new from previous exam. IMPRESSION: 1. Persistent left lower lobe atelectasis and airspace opacities. 2. New right base subsegmental atelectasis. Electronically Signed   By: Signa Kell M.D.   On: 03/14/2021 12:46

## 2021-03-17 LAB — COMPREHENSIVE METABOLIC PANEL
ALT: 26 U/L (ref 0–44)
AST: 45 U/L — ABNORMAL HIGH (ref 15–41)
Albumin: 2.9 g/dL — ABNORMAL LOW (ref 3.5–5.0)
Alkaline Phosphatase: 43 U/L (ref 38–126)
Anion gap: 10 (ref 5–15)
BUN: 14 mg/dL (ref 8–23)
CO2: 28 mmol/L (ref 22–32)
Calcium: 9 mg/dL (ref 8.9–10.3)
Chloride: 100 mmol/L (ref 98–111)
Creatinine, Ser: 0.87 mg/dL (ref 0.61–1.24)
GFR, Estimated: >60 mL/min (ref >60)
Glucose, Bld: 88 mg/dL (ref 70–99)
Potassium: 4.1 mmol/L (ref 3.5–5.1)
Sodium: 138 mmol/L (ref 135–145)
Total Bilirubin: 0.5 mg/dL (ref 0.3–1.2)
Total Protein: 5.9 g/dL — ABNORMAL LOW (ref 6.5–8.1)

## 2021-03-17 LAB — CBC WITH DIFFERENTIAL/PLATELET
Abs Immature Granulocytes: 0.03 10*3/uL (ref 0.00–0.07)
Basophils Absolute: 0.1 10*3/uL (ref 0.0–0.1)
Basophils Relative: 1 %
Eosinophils Absolute: 0.5 10*3/uL (ref 0.0–0.5)
Eosinophils Relative: 6 %
HCT: 31.3 % — ABNORMAL LOW (ref 39.0–52.0)
Hemoglobin: 10.8 g/dL — ABNORMAL LOW (ref 13.0–17.0)
Immature Granulocytes: 0 %
Lymphocytes Relative: 38 %
Lymphs Abs: 3.4 10*3/uL (ref 0.7–4.0)
MCH: 31.1 pg (ref 26.0–34.0)
MCHC: 34.5 g/dL (ref 30.0–36.0)
MCV: 90.2 fL (ref 80.0–100.0)
Monocytes Absolute: 1 10*3/uL (ref 0.1–1.0)
Monocytes Relative: 11 %
Neutro Abs: 3.9 10*3/uL (ref 1.7–7.7)
Neutrophils Relative %: 44 %
Platelets: 224 10*3/uL (ref 150–400)
RBC: 3.47 MIL/uL — ABNORMAL LOW (ref 4.22–5.81)
RDW: 13.2 % (ref 11.5–15.5)
WBC: 8.9 10*3/uL (ref 4.0–10.5)
nRBC: 0 % (ref 0.0–0.2)

## 2021-03-17 LAB — BRAIN NATRIURETIC PEPTIDE: B Natriuretic Peptide: 23 pg/mL (ref 0.0–100.0)

## 2021-03-17 LAB — MAGNESIUM: Magnesium: 1.8 mg/dL (ref 1.7–2.4)

## 2021-03-17 NOTE — Progress Notes (Signed)
PROGRESS NOTE                                                                                                                                                                                                             Patient Demographics:    Henry Black, is a 65 y.o. male, DOB - May 31, 1956, SWN:[id number]  Outpatient Primary MD for the patient is Sherrie Mustache, MD    LOS - 2  Admit date - 03/14/2021    Chief Complaint  Patient presents with   Altered Mental Status       Brief Narrative (HPI from H&P)     Henry Black is a 65 y.o. male with medical history significant for hypertension and hepatitis, liver cirrhosis, undifferentiated schizophrenia and schizoaffective disorder as well as history of cocaine abuse, who presented to the emergency room with acute onset of altered mental status, he was discharged just 1 day prior from the hospital to a group home, initial work-up was unremarkable and he was admitted for AMS work-up.   Subjective:   Patient in bed appears to be in no distress denies any headache, no chest or abdominal pain, denies any shortness of breath.   Assessment  & Plan :    Acute toxic encephalopathy - this seems to be related to drug abuse, his initial work-up including head CT, urine drug screen and ammonia level was unremarkable.  With supportive care and hydration is much better and admitted to smoking some substance that was given to him by a friend, urine drug screen was negative.  He is now much improved, has been seen by PT OT, cleared by psych, medically stable for discharge however currently does not have a safe disposition as per social worker his group home will not take him back, continue supportive care and continue to look for safe disposition.  Social work is following.  2.  History of schizophrenia.  Psych has been consulted they will manage psych issues and medications.  He was initially  IVC'd in the ER but that has been revoked on 03/16/21.  Currently on combination of Haldol, Depakote and Klonopin.  3.  History of cirrhosis.  Stable no acute issues ammonia level stable.  4.  HTN.  For now Norvasc and monitor.  5.  Dyslipidemia.  On home dose statin.  6.  Recent COVID-19  infection diagnosed on 03/09/2021.  Symptom-free comes off of quarantine on 03/19/2021.      Condition - Fair  Family Communication  :  None present  Code Status :  Full  Consults  :  Psych  PUD Prophylaxis :    Procedures  :     CT head.  Nonacute.      Disposition Plan  :    Status is: Observation  Dispo: The patient is from: Group home              Anticipated d/c is to: Group home              Patient currently is not medically stable to d/c.   Difficult to place patient No  DVT Prophylaxis  :    heparin injection 5,000 Units Start: 03/15/21 1400 SCDs Start: 03/14/21 1523    Lab Results  Component Value Date   PLT 224 03/17/2021    Diet :  Diet Order             DIET SOFT Room service appropriate? Yes; Fluid consistency: Thin  Diet effective now                    Inpatient Medications  Scheduled Meds:  amLODipine  10 mg Oral Daily   aspirin  81 mg Oral Daily   atorvastatin  20 mg Oral QPC supper   benztropine  1 mg Oral BID   clonazePAM  0.5 mg Oral BID   divalproex  500 mg Oral Q12H   haloperidol  10 mg Oral BID   heparin injection (subcutaneous)  5,000 Units Subcutaneous Q8H   sodium chloride flush  10 mL Intravenous Q12H   Continuous Infusions:  remdesivir 100 mg in NS 100 mL 100 mg (03/17/21 0915)   PRN Meds:.acetaminophen **OR** [DISCONTINUED] acetaminophen, albuterol, [DISCONTINUED] ondansetron **OR** ondansetron (ZOFRAN) IV  Antibiotics  :    Anti-infectives (From admission, onward)    Start     Dose/Rate Route Frequency Ordered Stop   03/15/21 1000  remdesivir 100 mg in sodium chloride 0.9 % 100 mL IVPB       See Hyperspace for full  Linked Orders Report.   100 mg 200 mL/hr over 30 Minutes Intravenous Daily 03/14/21 1608 03/19/21 0959   03/14/21 2200  nirmatrelvir/ritonavir EUA (PAXLOVID) 3 tablet  Status:  Discontinued        3 tablet Oral 2 times daily 03/14/21 1436 03/14/21 1608   03/14/21 1700  remdesivir 200 mg in sodium chloride 0.9% 250 mL IVPB       See Hyperspace for full Linked Orders Report.   200 mg 580 mL/hr over 30 Minutes Intravenous Once 03/14/21 1608 03/14/21 1913   03/14/21 1430  cefTRIAXone (ROCEPHIN) 1 g in sodium chloride 0.9 % 100 mL IVPB        1 g 200 mL/hr over 30 Minutes Intravenous  Once 03/14/21 1418 03/14/21 1538   03/14/21 1430  azithromycin (ZITHROMAX) 500 mg in sodium chloride 0.9 % 250 mL IVPB        500 mg 250 mL/hr over 60 Minutes Intravenous  Once 03/14/21 1418 03/14/21 1552        Time Spent in minutes  30   Susa Raring M.D on 03/17/2021 at 9:59 AM  To page go to [email or web address]   [facility] -  Office  [phone]    See all Orders from today for further details    Objective:   Vitals:  03/16/21 1637 03/16/21 2029 03/17/21 0434 03/17/21 0845  BP: 109/74 129/77 140/85 102/64  Pulse: 75 83 92 76  Resp: 17 19 17 15   Temp: (!) 97.5 F (36.4 C) 98.5 F (36.9 C) 98.5 F (36.9 C) 98.3 F (36.8 C)  TempSrc:      SpO2: 96% 96% 97% 94%  Weight:      Height:        Wt Readings from Last 3 Encounters:  03/14/21 78.5 kg  03/09/21 78.1 kg  03/05/21 80.1 kg     Intake/Output Summary (Last 24 hours) at 03/17/2021 0959 Last data filed at 03/17/2021 0200 Gross per 24 hour  Intake 120 ml  Output 1600 ml  Net -1480 ml     Physical Exam  Sleeping, arousable, appears calm, moves all 4 extremities to command, no focal deficits, [place].AT,PERRAL Supple Neck,No JVD, No cervical lymphadenopathy appriciated.  Symmetrical Chest wall movement, Good air movement bilaterally, CTAB RRR,No Gallops, Rubs or new Murmurs, No Parasternal Heave +ve B.Sounds, Abd Soft, No  tenderness, No organomegaly appriciated, No rebound - guarding or rigidity. No Cyanosis, Clubbing or edema, No new Rash or bruise    Data Review:    CBC Recent Labs  Lab 03/14/21 1136 03/15/21 0532 03/16/21 0527 03/17/21 0615  WBC 9.6 7.9 10.3 8.9  HGB 11.3* 9.8* 10.7* 10.8*  HCT 33.1* 28.5* 31.7* 31.3*  PLT 226 176 204 224  MCV 88.5 89.9 88.8 90.2  MCH 30.2 30.9 30.0 31.1  MCHC 34.1 34.4 33.8 34.5  RDW 13.2 13.6 13.3 13.2  LYMPHSABS 1.9  --  3.2 3.4  MONOABS 1.1*  --  1.3* 1.0  EOSABS 0.0  --  0.5 0.5  BASOSABS 0.0  --  0.1 0.1    Recent Labs  Lab 03/14/21 1135 03/14/21 1136 03/15/21 0532 03/16/21 0527 03/17/21 0615  NA 136  --  137 134* 138  K 4.2  --  3.7 3.9 4.1  CL 102  --  105 99 100  CO2 25  --  27 28 28   GLUCOSE 105*  --  85 82 88  BUN 35*  --  22 18 14   CREATININE 1.37*  --  0.93 0.82 0.87  CALCIUM 9.7  --  8.9 8.8* 9.0  AST 77*  --   --  62* 45*  ALT 26  --   --  28 26  ALKPHOS 53  --   --  41 43  BILITOT 0.6  --   --  0.7 0.5  ALBUMIN 3.9  --   --  2.9* 2.9*  MG  --   --   --  1.8 1.8  PROCALCITON <0.10  --   --   --   --   AMMONIA  --  29  --   --   --   BNP  --   --   --  41.5 23.0    ------------------------------------------------------------------------------------------------------------------ No results for input(s): CHOL, HDL, LDLCALC, TRIG, CHOLHDL, LDLDIRECT in the last 72 hours.  Lab Results  Component Value Date   HGBA1C 5.4 11/27/2020   ------------------------------------------------------------------------------------------------------------------ No results for input(s): TSH, T4TOTAL, T3FREE, THYROIDAB in the last 72 hours.  Invalid input(s): FREET3  Cardiac Enzymes No results for input(s): CKMB, TROPONINI, MYOGLOBIN in the last 168 hours.  Invalid input(s): CK ------------------------------------------------------------------------------------------------------------------    Component Value Date/Time   BNP 23.0  03/17/2021 0615     Radiology Reports    CT Head Wo Contrast  Result Date: 03/14/2021 CLINICAL  DATA:  Mental status change, unknown cause EXAM: CT HEAD WITHOUT CONTRAST TECHNIQUE: Contiguous axial images were obtained from the base of the skull through the vertex without intravenous contrast. COMPARISON:  CT 03/09/2021 FINDINGS: Brain: No evidence of acute intracranial hemorrhage or extra-axial collection.No evidence of mass lesion/concern mass effect.The ventricles are normal in size. Vascular: No hyperdense vessel or unexpected calcification. Skull: Normal. Negative for fracture or focal lesion. Sinuses/Orbits: No acute finding. Other: None. IMPRESSION: No acute intracranial abnormality. Electronically Signed   By: Caprice RenshawJacob  Kahn M.D.   On: 03/14/2021 13:53     DG Chest Port 1 View  Result Date: 03/14/2021 CLINICAL DATA:  Altered mental status. EXAM: PORTABLE CHEST 1 VIEW COMPARISON:  03/09/2021 FINDINGS: Heart size appears normal. Low lung volumes. No pleural effusion or edema. Atelectasis and airspace opacities are identified within the left lower lobe, similar to the previous exam. Subsegmental atelectasis within the right base is new from previous exam. IMPRESSION: 1. Persistent left lower lobe atelectasis and airspace opacities. 2. New right base subsegmental atelectasis. Electronically Signed   By: Signa Kellaylor  [place] M.D.   On: 03/14/2021 12:46

## 2021-03-18 MED ORDER — AMLODIPINE BESYLATE 5 MG PO TABS
5.0000 mg | ORAL_TABLET | Freq: Every day | ORAL | Status: DC
  Administered 2021-03-18: 09:00:00 5 mg via ORAL
  Filled 2021-03-18: qty 1

## 2021-03-18 NOTE — NC FL2 (Signed)
[place] MEDICAID FL2 LEVEL OF CARE SCREENING TOOL     IDENTIFICATION  Patient Name: Henry Black Birthdate: [birth year] Sex: male Admission Date (Current Location): 03/14/2021  [place] and [place] Number:  Chiropodist and Address:  [facility], [address], [place], [place] [address]      Provider Number: [id number]  Attending Physician Name and Address:  Leroy Sea, MD  Relative Name and Phone Number:  [name] (Other)   [phone] (Mobile)    Current Level of Care: Other (Comment) (group home) Recommended Level of Care: Assisted Living Facility, Family Care Home Prior Approval Number:    Date Approved/Denied:   PASRR Number:    Discharge Plan: Other (Comment) (group home)    Current Diagnoses: Patient Active Problem List   Diagnosis Date Noted   AMS (altered mental status) 03/14/2021   COVID-19 virus infection 03/14/2021   Aspiration pneumonia (HCC) 03/07/2021   Cocaine abuse (HCC) 03/05/2021   Essential hypertension 11/27/2020   Schizophrenia, undifferentiated (HCC) 11/26/2020   Chronic hepatitis C (HCC) 03/29/2020   Hepatic cirrhosis (HCC) 03/29/2020    Orientation RESPIRATION BLADDER Height & Weight     Self, Place, Time  Normal Continent Weight: 78.5 kg Height:  6' (182.9 cm)  BEHAVIORAL SYMPTOMS/MOOD NEUROLOGICAL BOWEL NUTRITION STATUS      Continent Diet (see discharge summary)  AMBULATORY STATUS COMMUNICATION OF NEEDS Skin   Independent Verbally Normal                       Personal Care Assistance Level of Assistance  Bathing, Feeding, Dressing Bathing Assistance: Independent Feeding assistance: Independent Dressing Assistance: Independent     Functional Limitations Info  Sight, Hearing, Speech Sight Info: Adequate Hearing Info: Adequate Speech Info: Adequate    SPECIAL CARE FACTORS FREQUENCY                       Contractures Contractures Info: Not present    Additional  Factors Info  Code Status, Allergies Code Status Info: Full Allergies Info: PCN           Current Medications (03/18/2021):  This is the current hospital active medication list Current Facility-Administered Medications  Medication Dose Route Frequency Provider Last Rate Last Admin   acetaminophen (TYLENOL) tablet 650 mg  650 mg Oral Q6H PRN Agbata, Tochukwu, MD       albuterol (VENTOLIN HFA) 108 (90 Base) MCG/ACT inhaler 2 puff  2 puff Inhalation Q6H PRN Leroy Sea, MD       amLODipine (NORVASC) tablet 5 mg  5 mg Oral Daily Leroy Sea, MD   5 mg at 03/18/21 5462   aspirin chewable tablet 81 mg  81 mg Oral Daily Leroy Sea, MD   81 mg at 03/18/21 7035   atorvastatin (LIPITOR) tablet 20 mg  20 mg Oral QPC supper Leroy Sea, MD   20 mg at 03/17/21 1658   benztropine (COGENTIN) tablet 1 mg  1 mg Oral BID Charm Rings, NP   1 mg at 03/18/21 0093   clonazePAM (KLONOPIN) tablet 0.5 mg  0.5 mg Oral BID Charm Rings, NP   0.5 mg at 03/18/21 0834   divalproex (DEPAKOTE) DR tablet 500 mg  500 mg Oral Q12H Charm Rings, NP   500 mg at 03/18/21 8182   haloperidol (HALDOL) tablet 10 mg  10 mg Oral BID Charm Rings, NP   10 mg at  03/18/21 0834   heparin injection 5,000 Units  5,000 Units Subcutaneous Q8H Leroy Sea, MD   5,000 Units at 03/18/21 0530   ondansetron (ZOFRAN) injection 4 mg  4 mg Intravenous Q6H PRN Agbata, Tochukwu, MD       sodium chloride flush (NS) 0.9 % injection 10 mL  10 mL Intravenous Q12H Leroy Sea, MD   10 mL at 03/18/21 5465     Discharge Medications: Medication List       TAKE these medications     amLODipine 5 MG tablet Commonly known as: NORVASC Take 1 tablet (5 mg total) by mouth daily.    atorvastatin 20 MG tablet Commonly known as: LIPITOR Take 20 mg by mouth daily.    benztropine 1 MG tablet Commonly known as: COGENTIN Take 1 tablet (1 mg total) by mouth 2 (two) times daily.    clonazePAM 0.5 MG  tablet Commonly known as: KLONOPIN Take 1 tablet (0.5 mg total) by mouth 2 (two) times daily.    divalproex 500 MG DR tablet Commonly known as: DEPAKOTE Take 1 tablet (500 mg total) by mouth 2 (two) times daily.    haloperidol 10 MG tablet Commonly known as: HALDOL Take 1 tablet (10 mg total) by mouth in the morning AND 2 tablets (20 mg total) at bedtime.    losartan 100 MG tablet Commonly known as: COZAAR Take 100 mg by mouth daily.     Relevant Imaging Results:  Relevant Lab Results:   Additional Information SS# [id number]  Allayne Butcher, RN

## 2021-03-18 NOTE — TOC Transition Note (Signed)
Transition of Care [facility]) - CM/SW Discharge Note   Patient Details  Name: Henry Black MRN: [id number] Date of Birth: [birth year]  [facility] CM/SW Contact:  Allayne Butcher, RN Phone Number: 03/18/2021, 12:25 PM   Clinical Narrative:    Patient is medically cleared for discharge back to his group home.  PT did recommend home health so RNCM arranged HH PT with [facility].  Patient does not need a walker as he is ambulating in the room without any DME. RNCM spoke with Leta Jungling at the group home and she reports that she will send someone to pick him up when ready.     Final next level of care: Group Home Barriers to Discharge: Barriers Resolved   Patient Goals and CMS Choice Patient states their goals for this hospitalization and ongoing recovery are:: Group home will take patient back today      Discharge Placement              Patient chooses bed at: Other - please specify in the comment section below: ([facility]) Patient to be transferred to facility by: FAcility will transport Name of family member notified: Leta Jungling- Group home Patient and family notified of of transfer: 03/18/21  Discharge Plan and Services                DME Arranged: N/A DME Agency: NA       HH Arranged: PT HH Agency: [facility] Date HH Agency Contacted: 03/18/21 Time HH Agency Contacted: 1224 Representative spoke with at [facility] Agency: Barbara Cower  Social Determinants of Health (SDOH) Interventions     Readmission Risk Interventions No flowsheet data found.

## 2021-03-18 NOTE — Care Management Important Message (Signed)
Important Message  Patient Details  Name: Henry Black MRN: [id number] Date of Birth: 1956/04/27   Medicare Important Message Given:  N/A - LOS <3 / Initial given by admissions  Initial Medicare IM reviewed with [name] by Bascom Levels, Patient Access Associate on 03/17/2021 at 9:24am.    [name] 03/18/2021, 8:27 AM

## 2021-03-18 NOTE — TOC Progression Note (Signed)
Transition of Care [facility]) - Progression Note    Patient Details  Name: Estuardo Frisbee MRN: [id number] Date of Birth: 1956-02-06  Transition of Care [facility]) CM/SW Contact  Allayne Butcher, RN Phone Number: 03/18/2021, 2:01 PM  Clinical Narrative:    Group home will be here in 5 to 10 min to pick up patient.     Barriers to Discharge: Barriers Resolved  Expected Discharge Plan and Services           Expected Discharge Date: 03/18/21               DME Arranged: N/A DME Agency: NA       HH Arranged: PT HH Agency: [facility] Date HH Agency Contacted: 03/18/21 Time HH Agency Contacted: 1224 Representative spoke with at [facility] Agency: Barbara Cower   Social Determinants of Health (SDOH) Interventions    Readmission Risk Interventions No flowsheet data found.

## 2021-03-18 NOTE — Discharge Instructions (Addendum)
Follow with Primary MD Sherrie Mustache, MD and your psychiatrist in 7 days   Get CBC, CMP, 2 view Chest X ray -  checked next visit within 1 week by Primary MD    Activity: As tolerated with Full fall precautions use walker/cane & assistance as needed  Disposition Group Home  Diet: Heart Healthy    Special Instructions: If you have smoked or chewed Tobacco  in the last 2 yrs please stop smoking, stop any regular Alcohol  and or any Recreational drug use.  On your next visit with your primary care physician please Get Medicines reviewed and adjusted.  Please request your Prim.MD to go over all Hospital Tests and Procedure/Radiological results at the follow up, please get all Hospital records sent to your Prim MD by signing hospital release before you go home.  If you experience worsening of your admission symptoms, develop shortness of breath, life threatening emergency, suicidal or homicidal thoughts you must seek medical attention immediately by calling 911 or calling your MD immediately  if symptoms less severe.  You Must read complete instructions/literature along with all the possible adverse reactions/side effects for all the Medicines you take and that have been prescribed to you. Take any new Medicines after you have completely understood and accpet all the possible adverse reactions/side effects.       Person Under Monitoring Name: Henry Black  Location: [address] [place] [address]   Infection Prevention Recommendations for Individuals Confirmed to have, or Being Evaluated for, 2019 Novel Coronavirus (COVID-19) Infection Who Receive Care at Home  Individuals who are confirmed to have, or are being evaluated for, COVID-19 should follow the prevention steps below until a healthcare provider or local or state health department says they can return to normal activities.  Stay home except to get medical care You should restrict activities outside your home, except for  getting medical care. Do not go to work, school, or public areas, and do not use public transportation or taxis.  Call ahead before visiting your doctor Before your medical appointment, call the healthcare provider and tell them that you have, or are being evaluated for, COVID-19 infection. This will help the healthcare provider's office take steps to keep other people from getting infected. Ask your healthcare provider to call the local or state health department.  Monitor your symptoms Seek prompt medical attention if your illness is worsening (e.g., difficulty breathing). Before going to your medical appointment, call the healthcare provider and tell them that you have, or are being evaluated for, COVID-19 infection. Ask your healthcare provider to call the local or state health department.  Wear a facemask You should wear a facemask that covers your nose and mouth when you are in the same room with other people and when you visit a healthcare provider. People who live with or visit you should also wear a facemask while they are in the same room with you.  Separate yourself from other people in your home As much as possible, you should stay in a different room from other people in your home. Also, you should use a separate bathroom, if available.  Avoid sharing household items You should not share dishes, drinking glasses, cups, eating utensils, towels, bedding, or other items with other people in your home. After using these items, you should wash them thoroughly with soap and water.  Cover your coughs and sneezes Cover your mouth and nose with a tissue when you cough or sneeze, or you can cough  or sneeze into your sleeve. Throw used tissues in a lined trash can, and immediately wash your hands with soap and water for at least 20 seconds or use an alcohol-based hand rub.  Wash your [employer] your hands often and thoroughly with soap and water for at least 20 seconds. You can use  an alcohol-based hand sanitizer if soap and water are not available and if your hands are not visibly dirty. Avoid touching your eyes, nose, and mouth with unwashed hands.   Prevention Steps for Caregivers and Household Members of Individuals Confirmed to have, or Being Evaluated for, COVID-19 Infection Being Cared for in the Home  If you live with, or provide care at home for, a person confirmed to have, or being evaluated for, COVID-19 infection please follow these guidelines to prevent infection:  Follow healthcare provider's instructions Make sure that you understand and can help the patient follow any healthcare provider instructions for all care.  Provide for the patient's basic needs You should help the patient with basic needs in the home and provide support for getting groceries, prescriptions, and other personal needs.  Monitor the patient's symptoms If they are getting sicker, call his or her medical provider and tell them that the patient has, or is being evaluated for, COVID-19 infection. This will help the healthcare provider's office take steps to keep other people from getting infected. Ask the healthcare provider to call the local or state health department.  Limit the number of people who have contact with the patient If possible, have only one caregiver for the patient. Other household members should stay in another home or place of residence. If this is not possible, they should stay in another room, or be separated from the patient as much as possible. Use a separate bathroom, if available. Restrict visitors who do not have an essential need to be in the home.  Keep older adults, very young children, and other sick people away from the patient Keep older adults, very young children, and those who have compromised immune systems or chronic health conditions away from the patient. This includes people with chronic heart, lung, or kidney conditions, diabetes, and  cancer.  Ensure good ventilation Make sure that shared spaces in the home have good air flow, such as from an air conditioner or an opened window, weather permitting.  Wash your hands often Wash your hands often and thoroughly with soap and water for at least 20 seconds. You can use an alcohol based hand sanitizer if soap and water are not available and if your hands are not visibly dirty. Avoid touching your eyes, nose, and mouth with unwashed hands. Use disposable paper towels to dry your hands. If not available, use dedicated cloth towels and replace them when they become wet.  Wear a facemask and gloves Wear a disposable facemask at all times in the room and gloves when you touch or have contact with the patient's blood, body fluids, and/or secretions or excretions, such as sweat, saliva, sputum, nasal mucus, vomit, urine, or feces.  Ensure the mask fits over your nose and mouth tightly, and do not touch it during use. Throw out disposable facemasks and gloves after using them. Do not reuse. Wash your hands immediately after removing your facemask and gloves. If your personal clothing becomes contaminated, carefully remove clothing and launder. Wash your hands after handling contaminated clothing. Place all used disposable facemasks, gloves, and other waste in a lined container before disposing them with other household waste.  Remove gloves and wash your hands immediately after handling these items.  Do not share dishes, glasses, or other household items with the patient Avoid sharing household items. You should not share dishes, drinking glasses, cups, eating utensils, towels, bedding, or other items with a patient who is confirmed to have, or being evaluated for, COVID-19 infection. After the person uses these items, you should wash them thoroughly with soap and water.  Wash laundry thoroughly Immediately remove and wash clothes or bedding that have blood, body fluids, and/or secretions  or excretions, such as sweat, saliva, sputum, nasal mucus, vomit, urine, or feces, on them. Wear gloves when handling laundry from the patient. Read and follow directions on labels of laundry or clothing items and detergent. In general, wash and dry with the warmest temperatures recommended on the label.  Clean all areas the individual has used often Clean all touchable surfaces, such as counters, tabletops, doorknobs, bathroom fixtures, toilets, phones, keyboards, tablets, and bedside tables, every day. Also, clean any surfaces that may have blood, body fluids, and/or secretions or excretions on them. Wear gloves when cleaning surfaces the patient has come in contact with. Use a diluted bleach solution (e.g., dilute bleach with 1 part bleach and 10 parts water) or a household disinfectant with a label that says EPA-registered for coronaviruses. To make a bleach solution at home, add 1 tablespoon of bleach to 1 quart (4 cups) of water. For a larger supply, add  cup of bleach to 1 gallon (16 cups) of water. Read labels of cleaning products and follow recommendations provided on product labels. Labels contain instructions for safe and effective use of the cleaning product including precautions you should take when applying the product, such as wearing gloves or eye protection and making sure you have good ventilation during use of the product. Remove gloves and wash hands immediately after cleaning.  Monitor yourself for signs and symptoms of illness Caregivers and household members are considered close contacts, should monitor their health, and will be asked to limit movement outside of the home to the extent possible. Follow the monitoring steps for close contacts listed on the symptom monitoring form.   ? If you have additional questions, contact your local health department or call the epidemiologist on call at [phone] (available 24/7). ? This guidance is subject to change. For the most  up-to-date guidance from [facility], please refer to their website: [employer]

## 2021-03-18 NOTE — Discharge Summary (Addendum)
Henry Black JOA:[id number] DOB: April 08, 1956 DOA: 03/14/2021  PCP: Sherrie Mustache, MD  Admit date: 03/14/2021  Discharge date: 03/18/2021  Admitted From: Group Home  Disposition:  G.Home   Recommendations for Outpatient Follow-up:   Follow up with PCP in 1-2 weeks  PCP Please obtain BMP/CBC, 2 view CXR in 1week,  (see Discharge instructions)   PCP Please follow up on the following pending results:    Home Health: None   Equipment/Devices: None  Consultations: Psych Discharge Condition: Stable    CODE STATUS: Full    Diet Recommendation: Heart Healthy      Chief Complaint  Patient presents with   Altered Mental Status     Brief history of present illness from the day of admission and additional interim summary    Henry Black is a 65 y.o. male with medical history significant for hypertension and hepatitis, liver cirrhosis, undifferentiated schizophrenia and schizoaffective disorder as well as history of cocaine abuse, who presented to the emergency room with acute onset of altered mental status, he was discharged just 1 day prior from the hospital to a group home, initial work-up was unremarkable and he was admitted for AMS work-up.                                                                 Hospital Course   Acute toxic encephalopathy - this seems to be related to drug abuse, his initial work-up including head CT, urine drug screen and ammonia level was unremarkable.  With supportive care and hydration is much better and admitted to smoking some substance that was given to him by a friend, urine drug screen was negative.  He is now much improved, has been seen by PT OT, cleared by psych, medically stable for discharge to his group home with close outpatient PCP and psychiatry follow-up.  Strictly  counseled to abstain from all substance abuse.   2.  History of schizophrenia.  Psych has been consulted they will manage psych issues and medications.  He was initially IVC'd in the ER but that has been revoked on 03/16/21.  Currently on combination of Haldol, Depakote and Klonopin.   3.  History of cirrhosis.  Stable no acute issues ammonia level stable.   4.  HTN.  on home regimen of Norvasc and ARB.   5.  Dyslipidemia.  On home dose statin.   6.  Recent COVID-19 infection diagnosed on 03/09/2021.  Symptom-free .   Discharge diagnosis     Principal Problem:   AMS (altered mental status) Active Problems:   Hepatic cirrhosis (HCC)   Schizophrenia, undifferentiated (HCC)   Essential hypertension   COVID-19 virus infection    Discharge instructions    Discharge Instructions     Diet - low sodium heart healthy   Complete by:  As directed    Discharge instructions   Complete by: As directed    Follow with Primary MD Sherrie Mustache, MD and your psychiatrist in 7 days   Get CBC, CMP, 2 view Chest X ray -  checked next visit within 1 week by Primary MD    Activity: As tolerated with Full fall precautions use walker/cane & assistance as needed  Disposition Group Home  Diet: Heart Healthy    Special Instructions: If you have smoked or chewed Tobacco  in the last 2 yrs please stop smoking, stop any regular Alcohol  and or any Recreational drug use.  On your next visit with your primary care physician please Get Medicines reviewed and adjusted.  Please request your Prim.MD to go over all Hospital Tests and Procedure/Radiological results at the follow up, please get all Hospital records sent to your Prim MD by signing hospital release before you go home.  If you experience worsening of your admission symptoms, develop shortness of breath, life threatening emergency, suicidal or homicidal thoughts you must seek medical attention immediately by calling 911 or calling your MD  immediately  if symptoms less severe.  You Must read complete instructions/literature along with all the possible adverse reactions/side effects for all the Medicines you take and that have been prescribed to you. Take any new Medicines after you have completely understood and accpet all the possible adverse reactions/side effects.   Increase activity slowly   Complete by: As directed        Discharge Medications   Allergies as of 03/18/2021       Reactions   Penicillins Rash   Has patient had a PCN reaction causing immediate rash, facial/tongue/throat swelling, SOB or lightheadedness with hypotension: Unknown Has patient had a PCN reaction causing severe rash involving mucus membranes or skin necrosis: Unknown Has patient had a PCN reaction that required hospitalization: Unknown Has patient had a PCN reaction occurring within the last 10 years: Unknown If all of the above answers are "NO", then may proceed with Cephalosporin use.        Medication List     TAKE these medications    amLODipine 5 MG tablet Commonly known as: NORVASC Take 1 tablet (5 mg total) by mouth daily.   atorvastatin 20 MG tablet Commonly known as: LIPITOR Take 20 mg by mouth daily.   benztropine 1 MG tablet Commonly known as: COGENTIN Take 1 tablet (1 mg total) by mouth 2 (two) times daily.   clonazePAM 0.5 MG tablet Commonly known as: KLONOPIN Take 1 tablet (0.5 mg total) by mouth 2 (two) times daily.   divalproex 500 MG DR tablet Commonly known as: DEPAKOTE Take 1 tablet (500 mg total) by mouth 2 (two) times daily.   haloperidol 10 MG tablet Commonly known as: HALDOL Take 1 tablet (10 mg total) by mouth in the morning AND 2 tablets (20 mg total) at bedtime.   losartan 100 MG tablet Commonly known as: COZAAR Take 100 mg by mouth daily.               Durable Medical Equipment  (From admission, onward)           Start     Ordered   03/16/21 0659  For home use only DME  Walker rolling  Once       Comments: 5 wheel  Question Answer Comment  Walker: With 5 Inch Wheels   Patient needs a walker to treat with the following condition Weakness  03/16/21 [id number]             Follow-up Information     Sherrie MustacheJadali, Fayegh, MD. Schedule an appointment as soon as possible for a visit in 1 week(s).   Specialty: Internal Medicine Why: Also follow-up with your psychiatrist within a week Contact information: [address] [place] [address] [phone]                 Major procedures and Radiology Reports - PLEASE review detailed and final reports thoroughly  -      CT Head Wo Contrast  Result Date: 03/14/2021 CLINICAL DATA:  Mental status change, unknown cause EXAM: CT HEAD WITHOUT CONTRAST TECHNIQUE: Contiguous axial images were obtained from the base of the skull through the vertex without intravenous contrast. COMPARISON:  CT 03/09/2021 FINDINGS: Brain: No evidence of acute intracranial hemorrhage or extra-axial collection.No evidence of mass lesion/concern mass effect.The ventricles are normal in size. Vascular: No hyperdense vessel or unexpected calcification. Skull: Normal. Negative for fracture or focal lesion. Sinuses/Orbits: No acute finding. Other: None. IMPRESSION: No acute intracranial abnormality. Electronically Signed   By: Caprice RenshawJacob  Kahn M.D.   On: 03/14/2021 13:53    DG Chest Port 1 View  Result Date: 03/14/2021 CLINICAL DATA:  Altered mental status. EXAM: PORTABLE CHEST 1 VIEW COMPARISON:  03/09/2021 FINDINGS: Heart size appears normal. Low lung volumes. No pleural effusion or edema. Atelectasis and airspace opacities are identified within the left lower lobe, similar to the previous exam. Subsegmental atelectasis within the right base is new from previous exam. IMPRESSION: 1. Persistent left lower lobe atelectasis and airspace opacities. 2. New right base subsegmental atelectasis. Electronically Signed   By: Signa Kellaylor  Stroud M.D.   On:  03/14/2021 12:46   DG Chest Portable 1 View  Result Date: 03/09/2021 CLINICAL DATA:  Altered mental status. EXAM: PORTABLE CHEST 1 VIEW COMPARISON:  March 07, 2021 FINDINGS: Mild areas of atelectasis and/or infiltrate are again seen within the bilateral lung bases, left greater than right. There is no evidence of a pleural effusion or pneumothorax. The heart size and mediastinal contours are within normal limits. The visualized skeletal structures are unremarkable. IMPRESSION: Mild, stable bibasilar atelectasis and/or infiltrate. Electronically Signed   By: Aram Candelahaddeus  Houston M.D.   On: 03/09/2021 05:08     Today   Subjective    Baker JanusDonald Gasparini today has no headache,no chest abdominal pain,no new weakness tingling or numbness, feels much better     Objective   Blood pressure 94/64, pulse 68, temperature 98.3 F (36.8 C), temperature source Oral, resp. rate 16, height 6' (1.829 m), weight 78.5 kg, SpO2 97 %.   Intake/Output Summary (Last 24 hours) at 03/18/2021 1117 Last data filed at 03/17/2021 2206 Gross per 24 hour  Intake 10 ml  Output --  Net 10 ml    Exam  Awake Alert, No new F.N deficits, Normal affect Allen.AT,PERRAL Supple Neck,No JVD, No cervical lymphadenopathy appriciated.  Symmetrical Chest wall movement, Good air movement bilaterally, CTAB RRR,No Gallops,Rubs or new Murmurs, No Parasternal Heave +ve B.Sounds, Abd Soft, Non tender, No organomegaly appriciated, No rebound -guarding or rigidity. No Cyanosis, Clubbing or edema, No new Rash or bruise   Data Review   CBC w Diff:  Lab Results  Component Value Date   WBC 8.9 03/17/2021   HGB 10.8 (L) 03/17/2021   HGB 12.2 (L) 11/21/2020   HCT 31.3 (L) 03/17/2021   HCT 38.3 11/21/2020   PLT 224 03/17/2021   PLT 163 11/21/2020  LYMPHOPCT 38 03/17/2021   MONOPCT 11 03/17/2021   EOSPCT 6 03/17/2021   BASOPCT 1 03/17/2021    CMP:  Lab Results  Component Value Date   NA 138 03/17/2021   NA 141 11/21/2020   K 4.1  03/17/2021   CL 100 03/17/2021   CO2 28 03/17/2021   BUN 14 03/17/2021   BUN 20 11/21/2020   CREATININE 0.87 03/17/2021   PROT 5.9 (L) 03/17/2021   PROT 6.9 11/21/2020   ALBUMIN 2.9 (L) 03/17/2021   ALBUMIN 4.6 11/21/2020   BILITOT 0.5 03/17/2021   BILITOT 0.5 11/21/2020   ALKPHOS 43 03/17/2021   AST 45 (H) 03/17/2021   ALT 26 03/17/2021  .   Total Time in preparing paper work, data evaluation and todays exam - 35 minutes  Susa Raring M.D on 03/18/2021 at 11:17 AM  [facility]

## 2021-03-18 NOTE — Progress Notes (Signed)
PROGRESS NOTE                                                                                                                                                                                                             Patient Demographics:    Henry Black, is a 65 y.o. male, DOB - 15-Sep-1955, JQB:[id number]  Outpatient Primary MD for the patient is Sherrie Mustache, MD    LOS - 3  Admit date - 03/14/2021    Chief Complaint  Patient presents with   Altered Mental Status       Brief Narrative (HPI from H&P)     Henry Black is a 65 y.o. male with medical history significant for hypertension and hepatitis, liver cirrhosis, undifferentiated schizophrenia and schizoaffective disorder as well as history of cocaine abuse, who presented to the emergency room with acute onset of altered mental status, he was discharged just 1 day prior from the hospital to a group home, initial work-up was unremarkable and he was admitted for AMS work-up.   Subjective:   Patient in bed, appears comfortable, denies any headache, no fever, no chest pain or pressure, no shortness of breath , no abdominal pain. No new focal weakness.    Assessment  & Plan :    Acute toxic encephalopathy - this seems to be related to drug abuse, his initial work-up including head CT, urine drug screen and ammonia level was unremarkable.  With supportive care and hydration is much better and admitted to smoking some substance that was given to him by a friend, urine drug screen was negative.  He is now much improved, has been seen by PT OT, cleared by psych, medically stable for discharge however currently does not have a safe disposition as per social worker his group home will not take him back, continue supportive care and continue to look for safe disposition.  Social work is following.  2.  History of schizophrenia.  Psych has been consulted they will manage psych  issues and medications.  He was initially IVC'd in the ER but that has been revoked on 03/16/21.  Currently on combination of Haldol, Depakote and Klonopin.  3.  History of cirrhosis.  Stable no acute issues ammonia level stable.  4.  HTN.  For now Norvasc and monitor.  5.  Dyslipidemia.  On home dose  statin.  6.  Recent COVID-19 infection diagnosed on 03/09/2021.  Symptom-free comes off of quarantine on 03/19/2021.      Condition - Fair  Family Communication  :  None present  Code Status :  Full  Consults  :  Psych  PUD Prophylaxis :    Procedures  :     CT head.  Nonacute.      Disposition Plan  :    Status is: Observation  Dispo: The patient is from: Group home              Anticipated d/c is to: Group home              Patient currently is not medically stable to d/c.   Difficult to place patient No  DVT Prophylaxis  :    heparin injection 5,000 Units Start: 03/15/21 1400 SCDs Start: 03/14/21 1523    Lab Results  Component Value Date   PLT 224 03/17/2021    Diet :  Diet Order             DIET SOFT Room service appropriate? Yes; Fluid consistency: Thin  Diet effective now                    Inpatient Medications  Scheduled Meds:  amLODipine  5 mg Oral Daily   aspirin  81 mg Oral Daily   atorvastatin  20 mg Oral QPC supper   benztropine  1 mg Oral BID   clonazePAM  0.5 mg Oral BID   divalproex  500 mg Oral Q12H   haloperidol  10 mg Oral BID   heparin injection (subcutaneous)  5,000 Units Subcutaneous Q8H   sodium chloride flush  10 mL Intravenous Q12H   Continuous Infusions:   PRN Meds:.acetaminophen **OR** [DISCONTINUED] acetaminophen, albuterol, [DISCONTINUED] ondansetron **OR** ondansetron (ZOFRAN) IV  Antibiotics  :    Anti-infectives (From admission, onward)    Start     Dose/Rate Route Frequency Ordered Stop   03/15/21 1000  remdesivir 100 mg in sodium chloride 0.9 % 100 mL IVPB       See Hyperspace for full Linked Orders  Report.   100 mg 200 mL/hr over 30 Minutes Intravenous Daily 03/14/21 1608 03/18/21 0903   03/14/21 2200  nirmatrelvir/ritonavir EUA (PAXLOVID) 3 tablet  Status:  Discontinued        3 tablet Oral 2 times daily 03/14/21 1436 03/14/21 1608   03/14/21 1700  remdesivir 200 mg in sodium chloride 0.9% 250 mL IVPB       See Hyperspace for full Linked Orders Report.   200 mg 580 mL/hr over 30 Minutes Intravenous Once 03/14/21 1608 03/14/21 1913   03/14/21 1430  cefTRIAXone (ROCEPHIN) 1 g in sodium chloride 0.9 % 100 mL IVPB        1 g 200 mL/hr over 30 Minutes Intravenous  Once 03/14/21 1418 03/14/21 1538   03/14/21 1430  azithromycin (ZITHROMAX) 500 mg in sodium chloride 0.9 % 250 mL IVPB        500 mg 250 mL/hr over 60 Minutes Intravenous  Once 03/14/21 1418 03/14/21 1552        Time Spent in minutes  30   Susa RaringPrashant Catherene Kaleta M.D on 03/18/2021 at 11:02 AM  To page go to [email or web address]   [facility] -  Office  [phone]    See all Orders from today for further details    Objective:   Vitals:   03/17/21 2032 03/18/21 0037 03/18/21  0841 03/18/21 1101  BP: 104/68 106/66 108/69 94/64  Pulse: 77 82 88 68  Resp: 16 18 20 16   Temp: 98.3 F (36.8 C) (!) 97.4 F (36.3 C) 98.6 F (37 C) 98.3 F (36.8 C)  TempSrc:    Oral  SpO2: 98% 96% 92% 97%  Weight:      Height:        Wt Readings from Last 3 Encounters:  03/14/21 78.5 kg  03/09/21 78.1 kg  03/05/21 80.1 kg     Intake/Output Summary (Last 24 hours) at 03/18/2021 1102 Last data filed at 03/17/2021 2206 Gross per 24 hour  Intake 10 ml  Output --  Net 10 ml     Physical Exam  Awake Alert x 2, No new F.N deficits,  [place].AT,PERRAL Supple Neck,No JVD, No cervical lymphadenopathy appriciated.  Symmetrical Chest wall movement, Good air movement bilaterally, CTAB RRR,No Gallops, Rubs or new Murmurs, No Parasternal Heave +ve B.Sounds, Abd Soft, No tenderness, No organomegaly appriciated, No rebound - guarding or  rigidity. No Cyanosis, Clubbing or edema, No new Rash or bruise     Data Review:    CBC Recent Labs  Lab 03/14/21 1136 03/15/21 0532 03/16/21 0527 03/17/21 0615  WBC 9.6 7.9 10.3 8.9  HGB 11.3* 9.8* 10.7* 10.8*  HCT 33.1* 28.5* 31.7* 31.3*  PLT 226 176 204 224  MCV 88.5 89.9 88.8 90.2  MCH 30.2 30.9 30.0 31.1  MCHC 34.1 34.4 33.8 34.5  RDW 13.2 13.6 13.3 13.2  LYMPHSABS 1.9  --  3.2 3.4  MONOABS 1.1*  --  1.3* 1.0  EOSABS 0.0  --  0.5 0.5  BASOSABS 0.0  --  0.1 0.1    Recent Labs  Lab 03/14/21 1135 03/14/21 1136 03/15/21 0532 03/16/21 0527 03/17/21 0615  NA 136  --  137 134* 138  K 4.2  --  3.7 3.9 4.1  CL 102  --  105 99 100  CO2 25  --  27 28 28   GLUCOSE 105*  --  85 82 88  BUN 35*  --  22 18 14   CREATININE 1.37*  --  0.93 0.82 0.87  CALCIUM 9.7  --  8.9 8.8* 9.0  AST 77*  --   --  62* 45*  ALT 26  --   --  28 26  ALKPHOS 53  --   --  41 43  BILITOT 0.6  --   --  0.7 0.5  ALBUMIN 3.9  --   --  2.9* 2.9*  MG  --   --   --  1.8 1.8  PROCALCITON <0.10  --   --   --   --   AMMONIA  --  29  --   --   --   BNP  --   --   --  41.5 23.0    ------------------------------------------------------------------------------------------------------------------ No results for input(s): CHOL, HDL, LDLCALC, TRIG, CHOLHDL, LDLDIRECT in the last 72 hours.  Lab Results  Component Value Date   HGBA1C 5.4 11/27/2020   ------------------------------------------------------------------------------------------------------------------ No results for input(s): TSH, T4TOTAL, T3FREE, THYROIDAB in the last 72 hours.  Invalid input(s): FREET3  Cardiac Enzymes No results for input(s): CKMB, TROPONINI, MYOGLOBIN in the last 168 hours.  Invalid input(s): CK ------------------------------------------------------------------------------------------------------------------    Component Value Date/Time   BNP 23.0 03/17/2021 0615     Radiology Reports    CT Head Wo  Contrast  Result Date: 03/14/2021 CLINICAL DATA:  Mental status change, unknown cause EXAM: CT  HEAD WITHOUT CONTRAST TECHNIQUE: Contiguous axial images were obtained from the base of the skull through the vertex without intravenous contrast. COMPARISON:  CT 03/09/2021 FINDINGS: Brain: No evidence of acute intracranial hemorrhage or extra-axial collection.No evidence of mass lesion/concern mass effect.The ventricles are normal in size. Vascular: No hyperdense vessel or unexpected calcification. Skull: Normal. Negative for fracture or focal lesion. Sinuses/Orbits: No acute finding. Other: None. IMPRESSION: No acute intracranial abnormality. Electronically Signed   By: Caprice Renshaw M.D.   On: 03/14/2021 13:53     DG Chest Port 1 View  Result Date: 03/14/2021 CLINICAL DATA:  Altered mental status. EXAM: PORTABLE CHEST 1 VIEW COMPARISON:  03/09/2021 FINDINGS: Heart size appears normal. Low lung volumes. No pleural effusion or edema. Atelectasis and airspace opacities are identified within the left lower lobe, similar to the previous exam. Subsegmental atelectasis within the right base is new from previous exam. IMPRESSION: 1. Persistent left lower lobe atelectasis and airspace opacities. 2. New right base subsegmental atelectasis. Electronically Signed   By: Signa Kell M.D.   On: 03/14/2021 12:46

## 2021-05-30 ENCOUNTER — Other Ambulatory Visit: Payer: Self-pay

## 2021-05-30 ENCOUNTER — Emergency Department: Payer: Medicare Other

## 2021-05-30 ENCOUNTER — Emergency Department
Admission: EM | Admit: 2021-05-30 | Discharge: 2021-05-30 | Disposition: A | Discharge status: Home / Self Care | Payer: Medicare Other | Attending: Emergency Medicine | Admitting: Emergency Medicine

## 2021-05-30 DIAGNOSIS — S300XXA Contusion of lower back and pelvis, initial encounter: Secondary | ICD-10-CM | POA: Insufficient documentation

## 2021-05-30 DIAGNOSIS — Z8616 Personal history of COVID-19: Secondary | ICD-10-CM | POA: Insufficient documentation

## 2021-05-30 DIAGNOSIS — F17210 Nicotine dependence, cigarettes, uncomplicated: Secondary | ICD-10-CM | POA: Diagnosis not present

## 2021-05-30 DIAGNOSIS — S3992XA Unspecified injury of lower back, initial encounter: Secondary | ICD-10-CM | POA: Diagnosis present

## 2021-05-30 DIAGNOSIS — I10 Essential (primary) hypertension: Secondary | ICD-10-CM | POA: Insufficient documentation

## 2021-05-30 DIAGNOSIS — Z79899 Other long term (current) drug therapy: Secondary | ICD-10-CM | POA: Insufficient documentation

## 2021-05-30 MED ORDER — BACLOFEN 10 MG PO TABS: 10.0000 mg | ORAL_TABLET | Freq: Three times a day (TID) | ORAL | 0 refills | Status: AC

## 2021-05-30 MED ORDER — MELOXICAM 15 MG PO TABS: 15.0000 mg | ORAL_TABLET | Freq: Every day | ORAL | 2 refills | Status: AC
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
## 2021-05-30 NOTE — ED Notes (Signed)
RN called and talked to [name] who is in charge of the group home that pt lives at. Per Leta Jungling, give her 30-45 more min to find pt a ride.
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
## 2021-05-30 NOTE — Discharge Instructions (Signed)
Follow-up with your regular doctor if not improving to 3 days.  Return emergency department worsening.  Follow-up orthopedics if not improving in 1 week.  Apply ice to your lower back.  Take medication as prescribed
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
## 2021-05-30 NOTE — ED Notes (Signed)
RN called and talked to [place]. " Someone is on the way. They should be there in 10-15 min."
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
## 2021-06-12 ENCOUNTER — Emergency Department
Admission: EM | Admit: 2021-06-12 | Discharge: 2021-06-13 | Disposition: A | Discharge status: Home / Self Care | Payer: Medicare Other | Attending: Emergency Medicine | Admitting: Emergency Medicine

## 2021-06-12 ENCOUNTER — Emergency Department: Payer: Medicare Other

## 2021-06-12 ENCOUNTER — Other Ambulatory Visit: Payer: Self-pay

## 2021-06-12 DIAGNOSIS — Z8616 Personal history of COVID-19: Secondary | ICD-10-CM | POA: Insufficient documentation

## 2021-06-12 DIAGNOSIS — F1290 Cannabis use, unspecified, uncomplicated: Secondary | ICD-10-CM | POA: Diagnosis not present

## 2021-06-12 DIAGNOSIS — I10 Essential (primary) hypertension: Secondary | ICD-10-CM | POA: Insufficient documentation

## 2021-06-12 DIAGNOSIS — R4182 Altered mental status, unspecified: Secondary | ICD-10-CM | POA: Diagnosis not present

## 2021-06-12 DIAGNOSIS — I959 Hypotension, unspecified: Secondary | ICD-10-CM | POA: Insufficient documentation

## 2021-06-12 DIAGNOSIS — R531 Weakness: Secondary | ICD-10-CM | POA: Insufficient documentation

## 2021-06-12 DIAGNOSIS — K7460 Unspecified cirrhosis of liver: Secondary | ICD-10-CM | POA: Insufficient documentation

## 2021-06-12 DIAGNOSIS — Z79899 Other long term (current) drug therapy: Secondary | ICD-10-CM | POA: Diagnosis not present

## 2021-06-12 DIAGNOSIS — F17210 Nicotine dependence, cigarettes, uncomplicated: Secondary | ICD-10-CM | POA: Insufficient documentation

## 2021-06-12 DIAGNOSIS — F1490 Cocaine use, unspecified, uncomplicated: Secondary | ICD-10-CM | POA: Insufficient documentation

## 2021-06-12 DIAGNOSIS — R410 Disorientation, unspecified: Secondary | ICD-10-CM | POA: Insufficient documentation

## 2021-06-12 LAB — COMPREHENSIVE METABOLIC PANEL
ALT: 28 U/L (ref 0–44)
AST: 26 U/L (ref 15–41)
Albumin: 3.4 g/dL — ABNORMAL LOW (ref 3.5–5.0)
Alkaline Phosphatase: 48 U/L (ref 38–126)
Anion gap: 6 (ref 5–15)
BUN: 16 mg/dL (ref 8–23)
CO2: 29 mmol/L (ref 22–32)
Calcium: 9 mg/dL (ref 8.9–10.3)
Chloride: 105 mmol/L (ref 98–111)
Creatinine, Ser: 0.97 mg/dL (ref 0.61–1.24)
GFR, Estimated: >60 mL/min (ref >60)
Glucose, Bld: 94 mg/dL (ref 70–99)
Potassium: 4 mmol/L (ref 3.5–5.1)
Sodium: 140 mmol/L (ref 135–145)
Total Bilirubin: 0.6 mg/dL (ref 0.3–1.2)
Total Protein: 6.9 g/dL (ref 6.5–8.1)

## 2021-06-12 LAB — CBC
HCT: 38.8 % — ABNORMAL LOW (ref 39.0–52.0)
Hemoglobin: 12.6 g/dL — ABNORMAL LOW (ref 13.0–17.0)
MCH: 30.3 pg (ref 26.0–34.0)
MCHC: 32.5 g/dL (ref 30.0–36.0)
MCV: 93.3 fL (ref 80.0–100.0)
Platelets: 204 10*3/uL (ref 150–400)
RBC: 4.16 MIL/uL — ABNORMAL LOW (ref 4.22–5.81)
RDW: 13.2 % (ref 11.5–15.5)
WBC: 6.1 10*3/uL (ref 4.0–10.5)
nRBC: 0 % (ref 0.0–0.2)

## 2021-06-12 LAB — URINALYSIS, ROUTINE W REFLEX MICROSCOPIC
Bilirubin Urine: NEGATIVE
Glucose, UA: NEGATIVE mg/dL
Hgb urine dipstick: NEGATIVE
Ketones, ur: NEGATIVE mg/dL
Leukocytes,Ua: NEGATIVE
Nitrite: NEGATIVE
Protein, ur: NEGATIVE mg/dL
Specific Gravity, Urine: 1.014 (ref 1.005–1.030)
pH: 6 (ref 5.0–8.0)

## 2021-06-12 LAB — URINE DRUG SCREEN, QUALITATIVE (ARMC ONLY)
Amphetamines, Ur Screen: NOT DETECTED
Barbiturates, Ur Screen: NOT DETECTED
Benzodiazepine, Ur Scrn: NOT DETECTED
Cannabinoid 50 Ng, Ur ~~LOC~~: POSITIVE — AB
Cocaine Metabolite,Ur ~~LOC~~: POSITIVE — AB
MDMA (Ecstasy)Ur Screen: NOT DETECTED
Methadone Scn, Ur: NOT DETECTED
Opiate, Ur Screen: NOT DETECTED
Phencyclidine (PCP) Ur S: NOT DETECTED
Tricyclic, Ur Screen: NOT DETECTED

## 2021-06-12 LAB — AMMONIA: Ammonia: 13 umol/L (ref 9–35)

## 2021-06-12 LAB — TROPONIN I (HIGH SENSITIVITY)
Troponin I (High Sensitivity): 24 ng/L — ABNORMAL HIGH (ref <18)
Troponin I (High Sensitivity): 27 ng/L — ABNORMAL HIGH (ref <18)
Troponin I (High Sensitivity): 21 ng/L — ABNORMAL HIGH (ref <18)

## 2021-06-12 LAB — PROTIME-INR
INR: 1 (ref 0.8–1.2)
Prothrombin Time: 13 seconds (ref 11.4–15.2)

## 2021-06-12 LAB — ETHANOL: Alcohol, Ethyl (B): <10 mg/dL (ref <10)

## 2021-06-12 LAB — VALPROIC ACID LEVEL: Valproic Acid Lvl: 61 ug/mL (ref 50.0–100.0)

## 2021-06-12 MED ORDER — SODIUM CHLORIDE 0.9 % IV BOLUS
500.0000 mL | Freq: Once | INTRAVENOUS | Status: AC
  Administered 2021-06-12: 500 mL via INTRAVENOUS

## 2021-06-12 MED ORDER — SODIUM CHLORIDE 0.9 % IV BOLUS
1000.0000 mL | Freq: Once | INTRAVENOUS | Status: AC
  Administered 2021-06-12: 1000 mL via INTRAVENOUS

## 2021-06-12 NOTE — ED Notes (Signed)
PO fluids and snack provided; tolerating well
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
## 2021-06-12 NOTE — ED Provider Notes (Signed)
[facility] Emergency Department Provider Note  ____________________________________________   Event Date/Time   First MD Initiated Contact with Patient 06/12/21 1700     (approximate)  I have reviewed the triage vital signs and the nursing notes.   HISTORY  Chief Complaint Hypotension and Altered Mental Status    HPI Henry Black is a 65 y.o. male  with PMHx HTN, schizophrenia, cirrhosis, here with AMS. History limited on arrival 2/2 confusion. Per report, EMS was called to the scene because pt was confused and weak. Reportedly, his BP was in the 60s at that time though it has been normal here after a small amount of fluid en route. Pt has been slow to respond but will follow commands. No known recent trauma. Remainder of history limited 2/2 AMS.  Level 5 caveat invoked as remainder of history, ROS, and physical exam limited due to patient's AMS.         Past Medical History:  Diagnosis Date   Hepatitis    Hypertension     Patient Active Problem List   Diagnosis Date Noted   AMS (altered mental status) 03/14/2021   COVID-19 virus infection 03/14/2021   Aspiration pneumonia (HCC) 03/07/2021   Cocaine abuse (HCC) 03/05/2021   Essential hypertension 11/27/2020   Schizophrenia, undifferentiated (HCC) 11/26/2020   Chronic hepatitis C (HCC) 03/29/2020   Hepatic cirrhosis (HCC) 03/29/2020    Past Surgical History:  Procedure Laterality Date   ESOPHAGOGASTRODUODENOSCOPY (EGD) WITH PROPOFOL N/A 12/21/2019   Procedure: ESOPHAGOGASTRODUODENOSCOPY (EGD) WITH PROPOFOL;  Surgeon: Toney Reil, MD;  Location: [facility] ENDOSCOPY;  Service: Gastroenterology;  Laterality: N/A;    Prior to Admission medications   Medication Sig Start Date End Date Taking? Authorizing Provider  amLODipine (NORVASC) 5 MG tablet Take 1 tablet (5 mg total) by mouth daily. 12/11/20   Jesse Sans, MD  atorvastatin (LIPITOR) 20 MG tablet Take 20 mg by mouth daily.     [provider]  benztropine (COGENTIN) 1 MG tablet Take 1 tablet (1 mg total) by mouth 2 (two) times daily. 03/08/21   Arnetha Courser, MD  clonazePAM (KLONOPIN) 0.5 MG tablet Take 1 tablet (0.5 mg total) by mouth 2 (two) times daily. 03/08/21   Arnetha Courser, MD  divalproex (DEPAKOTE) 500 MG DR tablet Take 1 tablet (500 mg total) by mouth 2 (two) times daily. 12/10/20   Jesse Sans, MD  haloperidol (HALDOL) 10 MG tablet Take 1 tablet (10 mg total) by mouth in the morning AND 2 tablets (20 mg total) at bedtime. 12/10/20   Jesse Sans, MD  losartan (COZAAR) 100 MG tablet Take 100 mg by mouth daily.    [provider]  meloxicam (MOBIC) 15 MG tablet Take 1 tablet (15 mg total) by mouth daily. 05/30/21 05/30/22  Faythe Ghee, PA-C    Allergies Penicillins  History reviewed. No pertinent family history.  Social History Social History   Tobacco Use   Smoking status: Every Day    Packs/day: 0.50    Years: 46.00    Pack years: 23.00    Types: Cigarettes   Smokeless tobacco: Never  Vaping Use   Vaping Use: Never used  Substance Use Topics   Alcohol use: Not Currently    Alcohol/week: 1.0 standard drink    Types: 1 Standard drinks or equivalent per week   Drug use: Not Currently    Types: "Crack" cocaine    Review of Systems  Review of Systems  Unable to perform  ROS: Mental status change    ____________________________________________  PHYSICAL EXAM:      VITAL SIGNS: ED Triage Vitals [06/12/21 1403]  Enc Vitals Group     BP (!) 125/93     Pulse Rate 69     Resp 16     Temp 98.9 F (37.2 C)     Temp Source Oral     SpO2 94 %     Weight      Height      Head Circumference      Peak Flow      Pain Score      Pain Loc      Pain Edu?      Excl. in GC?      Physical Exam Vitals and nursing note reviewed.  Constitutional:      General: He is not in acute distress.    Appearance: He is well-developed.  HENT:     Head: Normocephalic and  atraumatic.     Mouth/Throat:     Mouth: Mucous membranes are dry.  Eyes:     Conjunctiva/sclera: Conjunctivae normal.  Cardiovascular:     Rate and Rhythm: Normal rate and regular rhythm.     Heart sounds: Normal heart sounds.  Pulmonary:     Effort: Pulmonary effort is normal. No respiratory distress.     Breath sounds: No wheezing.  Abdominal:     General: There is no distension.  Musculoskeletal:     Cervical back: Neck supple.  Skin:    General: Skin is warm.     Capillary Refill: Capillary refill takes less than 2 seconds.     Findings: No rash.  Neurological:     Mental Status: He is disoriented and confused.     GCS: GCS eye subscore is 4. GCS verbal subscore is 4. GCS motor subscore is 6.     Motor: No abnormal muscle tone.     Comments: MAE with 5/5 strength. Face appears symmetric. Speech slightly slowed, slurred. Falls asleep easily, unable to follow complex commands or answer questions.      ____________________________________________   LABS (all labs ordered are listed, but only abnormal results are displayed)  Labs Reviewed  COMPREHENSIVE METABOLIC PANEL - Abnormal; Notable for the following components:      Result Value   Albumin 3.4 (*)    All other components within normal limits  CBC - Abnormal; Notable for the following components:   RBC 4.16 (*)    Hemoglobin 12.6 (*)    HCT 38.8 (*)    All other components within normal limits  URINE DRUG SCREEN, QUALITATIVE ([facility] ONLY) - Abnormal; Notable for the following components:   Cocaine Metabolite,Ur [place] POSITIVE (*)    Cannabinoid 50 Ng, Ur [place] POSITIVE (*)    All other components within normal limits  BLOOD GAS, VENOUS - Abnormal; Notable for the following components:   Bicarbonate 32.8 (*)    Acid-Base Excess 5.6 (*)    All other components within normal limits  URINALYSIS, ROUTINE W REFLEX MICROSCOPIC - Abnormal; Notable for the following components:   Color, Urine YELLOW (*)    APPearance CLEAR  (*)    All other components within normal limits  TROPONIN I (HIGH SENSITIVITY) - Abnormal; Notable for the following components:   Troponin I (High Sensitivity) 24 (*)    All other components within normal limits  TROPONIN I (HIGH SENSITIVITY) - Abnormal; Notable for the following components:   Troponin I (High Sensitivity) 27 (*)  All other components within normal limits  TROPONIN I (HIGH SENSITIVITY) - Abnormal; Notable for the following components:   Troponin I (High Sensitivity) 21 (*)    All other components within normal limits  PROTIME-INR  ETHANOL  AMMONIA  VALPROIC ACID LEVEL  TROPONIN I (HIGH SENSITIVITY)    ____________________________________________  EKG: Normal sinus rhythm, VR 67. PR 154, QRS 90, QTc 397. No acute ST elevations or depressions. No ischemia or infarct. ________________________________________  RADIOLOGY All imaging, including plain films, CT scans, and ultrasounds, independently reviewed by me, and interpretations confirmed via formal radiology reads.  ED MD interpretation:   CT Head: Negative CXR: Clear  Official radiology report(s): CT HEAD WO CONTRAST ( )  Result Date: 06/12/2021 CLINICAL DATA:  Delirium EXAM: CT HEAD WITHOUT CONTRAST TECHNIQUE: Contiguous axial images were obtained from the base of the skull through the vertex without intravenous contrast. COMPARISON:  03/14/2021 FINDINGS: Brain: No evidence of acute infarction, hemorrhage, hydrocephalus, extra-axial collection or mass lesion/mass effect. Vascular: Negative for hyperdense vessel Skull: Negative Sinuses/Orbits: Mild mucosal edema paranasal sinuses. Negative orbit Other: None IMPRESSION: Negative CT of the brain. Electronically Signed   By: Marlan Palau M.D.   On: 06/12/2021 18:06   DG Chest Portable 1 View  Result Date: 06/12/2021 CLINICAL DATA:  Weakness, SOB per ordering notes. Patient unable to give adequate history at this time- per ED notes- Pt comes into the ED via  EMS called out for b/p of 60, states he is altered, pt is from a group home, states initially he did not want to come but is not able to care for himself. EXAM: PORTABLE CHEST 1 VIEW COMPARISON:  03/14/2021 and older studies. FINDINGS: Cardiac silhouette is normal in size. No mediastinal or hilar masses. Linear lung base opacities are noted consistent with atelectasis, scarring or a combination, without significant change from the prior study allowing for differences in lung volume. Remainder of the lungs is clear. No convincing pleural effusion.  No pneumothorax. Skeletal structures are grossly intact. IMPRESSION: 1. No acute cardiopulmonary disease. Electronically Signed   By: Amie Portland M.D.   On: 06/12/2021 17:41    ____________________________________________  PROCEDURES   Procedure(s) performed (including Critical Care):  Procedures  ____________________________________________  INITIAL IMPRESSION / MDM / ASSESSMENT AND PLAN / ED COURSE  As part of my medical decision making, I reviewed the following data within the electronic MEDICAL RECORD NUMBER Nursing notes reviewed and incorporated, Old chart reviewed, Notes from prior ED visits, and [place] Controlled Substance Database       *Clennon Maczko was evaluated in Emergency Department on 06/13/2021 for the symptoms described in the history of present illness. He was evaluated in the context of the global COVID-19 pandemic, which necessitated consideration that the patient might be at risk for infection with the SARS-CoV-2 virus that causes COVID-19. Institutional protocols and algorithms that pertain to the evaluation of patients at risk for COVID-19 are in a state of rapid change based on information released by regulatory bodies including the CDC and federal and state organizations. These policies and algorithms were followed during the patient's care in the ED.  Some ED evaluations and interventions may be delayed as a result of limited  staffing during the pandemic.*     Medical Decision Making: 64 year old male with history of cirrhosis, schizophrenia, here with altered mental status.  Patient has a history of recurrent ED visits and admissions for the same.  Initially, patient very drowsy.  Extensive work-up obtained, reviewed, and is overall  very reassuring.  CT head is negative.  Patient has no focal neurological deficit to suggest stroke.  There is no seizure-like activity.  Chest x-ray clear.  CBC unremarkable with no leukocytosis.  CMP at baseline.  Blood gas shows no signs of retention.  UA without signs of UTI.  Ammonia normal.  Valproic acid normal.  Troponin minimally elevated, but I suspect this is related to his cocaine abuse, which is also what I suspect has contributed to his altered mental status.  UDS positive for THC as well as cocaine.  Alcohol negative.  Patient has a history of recurrent admissions in the setting of substance abuse.  He was given fluids and monitored in the ED with gradual return to his baseline.  He denies any complaints.  Given his return to baseline with reassuring work-up and likely etiology of substance abuse with underlying cirrhosis and schizophrenia, safe for discharge to his group home.  ____________________________________________  FINAL CLINICAL IMPRESSION(S) / ED DIAGNOSES  Final diagnoses:  Altered mental status, unspecified altered mental status type  Cocaine use  Marijuana use     MEDICATIONS GIVEN DURING THIS VISIT:  Medications  sodium chloride 0.9 % bolus 1,000 mL (0 mLs Intravenous Stopped 06/12/21 1918)  sodium chloride 0.9 % bolus 500 mL (500 mLs Intravenous Bolus 06/12/21 2215)     ED Discharge Orders     None        Note:  This document was prepared using Dragon voice recognition software and may include unintentional dictation errors.   Shaune Pollack, MD 06/13/21 [id number]

## 2021-06-12 NOTE — ED Notes (Signed)
Urine and covid swab sent at this time.

## 2021-06-12 NOTE — ED Notes (Signed)
Called Henry Black [phone]; "not able to pick up patient" but will call to see if another staff member at [facility] group home can transport patient back to facility ([address] [place], [place])

## 2021-06-12 NOTE — ED Notes (Signed)
EDP was just at bedside. See new orders.

## 2021-06-12 NOTE — ED Triage Notes (Signed)
Pt comes into the ED via EMS called out for b/p of 60, states he is altered, pt is from a group home, states initially he did not want to come  but is not able to care for himself.   [id number]/78 #[id number] NS infusing on arrival

## 2021-07-01 LAB — BLOOD GAS, VENOUS
Acid-Base Excess: 5.6 mmol/L — ABNORMAL HIGH (ref 0.0–2.0)
Bicarbonate: 32.8 mmol/L — ABNORMAL HIGH (ref 20.0–28.0)
O2 Saturation: 32 %
Patient temperature: 37
pCO2, Ven: 58 mmHg (ref 44.0–60.0)
pH, Ven: 7.36 (ref 7.250–7.430)

## 2021-07-03 ENCOUNTER — Ambulatory Visit: Payer: Medicare Other | Admitting: Gastroenterology

## 2022-05-07 IMAGING — DX DG CHEST 1V PORT
1 series · 1 of 1 positions shown · non-contrast
Comparison: None

CLINICAL DATA: Altered mental status, low blood pressure.

EXAM:
PORTABLE CHEST 1 VIEW

[chest ap]
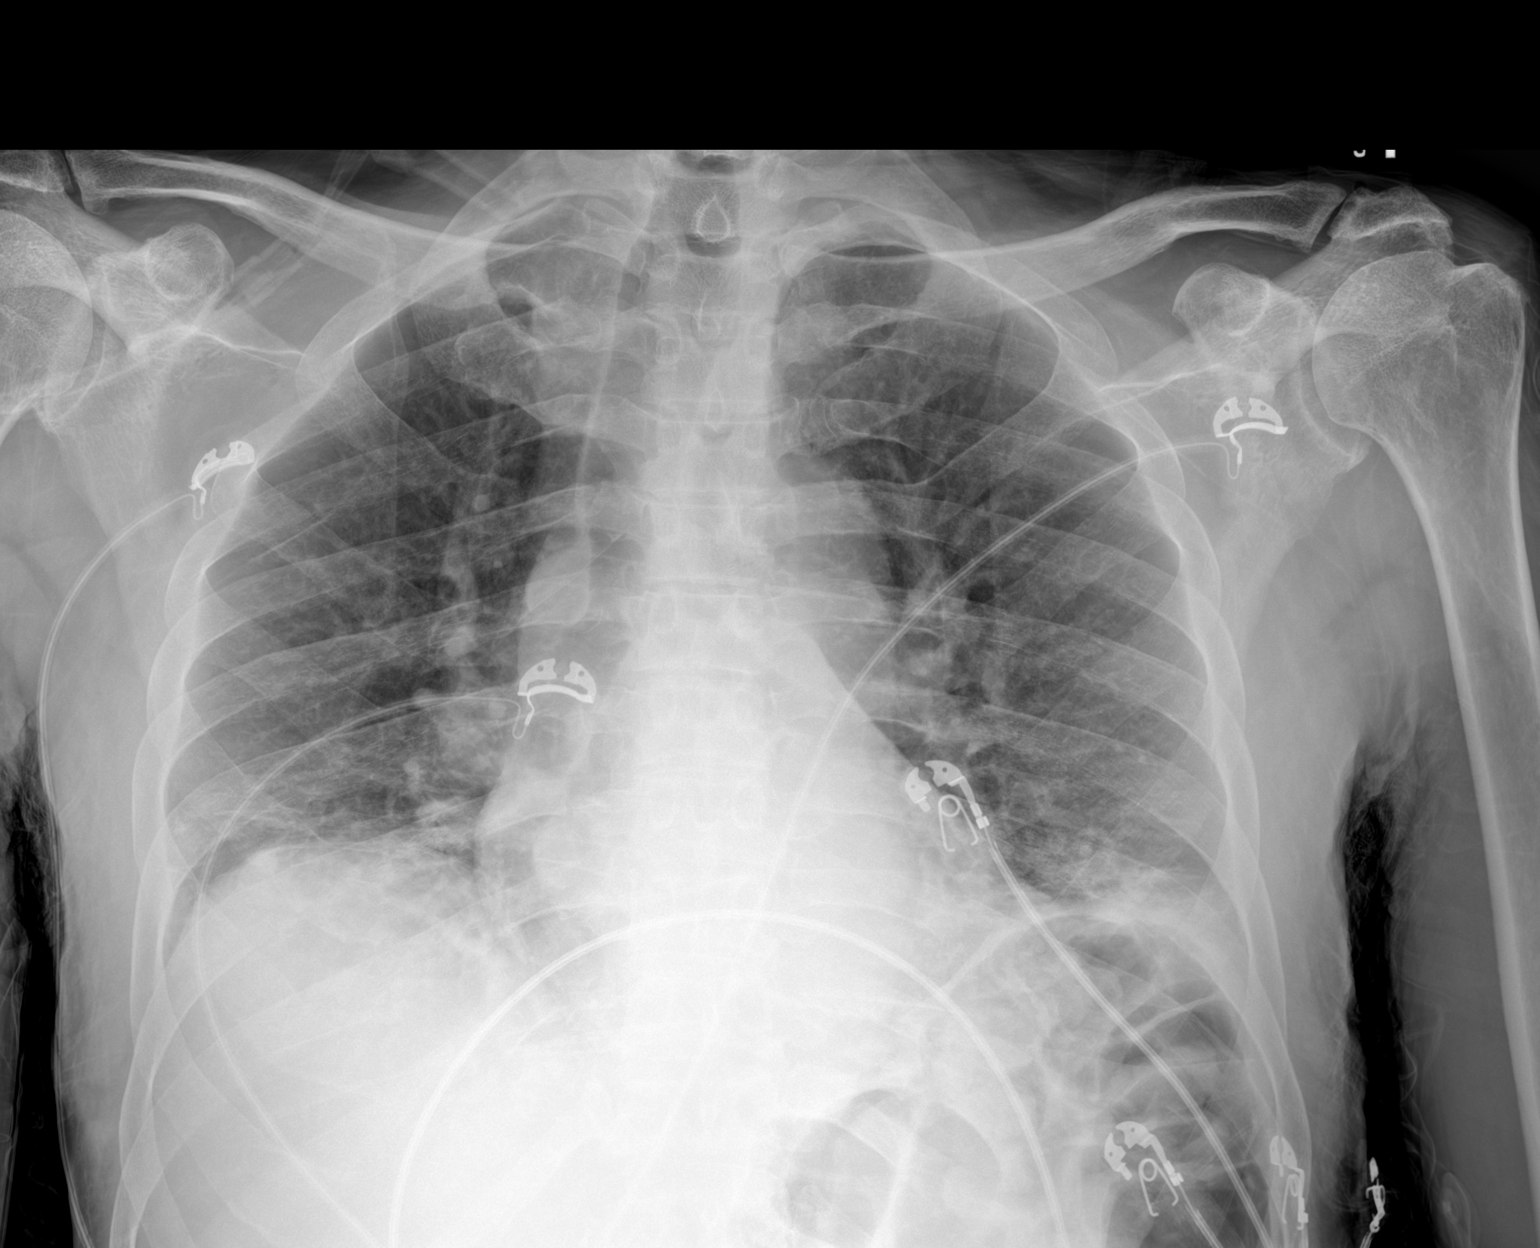

[1 of 1 positions shown; findings below may reference images not displayed]

FINDINGS: Shallow inspiration radiograph. Heart size within normal limits.
Mild ill-defined opacity within the left lung base, which may
reflect atelectasis or aspiration/pneumonia. Minimal atelectasis
within the right lung base. No evidence of pleural effusion or
pneumothorax. No acute bony abnormality identified.
IMPRESSION: Shallow inspiration radiograph.

Mild ill-defined opacity within the left lung base, which may
reflect atelectasis or sequela of aspiration/pneumonia.

Minimal atelectasis within the right lung base.

## 2022-05-07 IMAGING — CT CT HEAD W/O CM
3 series · 15 of 47 positions shown, 18 images · non-contrast
Comparison: Prior head CT 11/29/2020.

CLINICAL DATA: Mental status change, unknown cause.

EXAM:
CT HEAD WITHOUT CONTRAST
TECHNIQUE: Contiguous axial images were obtained from the base of the skull
through the vertex without intravenous contrast.

[Series 2: head wo · axial · 0.43mm/px · z∈[-138,-13]mm · 9 of 31 slices shown, 12 images]
[im 3/31  brain]
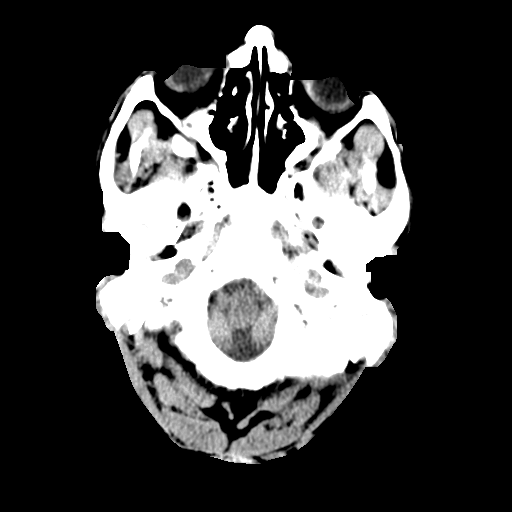
[im 3/31  bone]
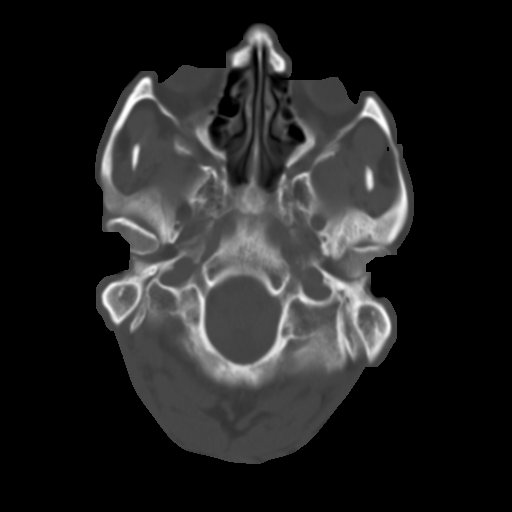
[im 6/31  brain]
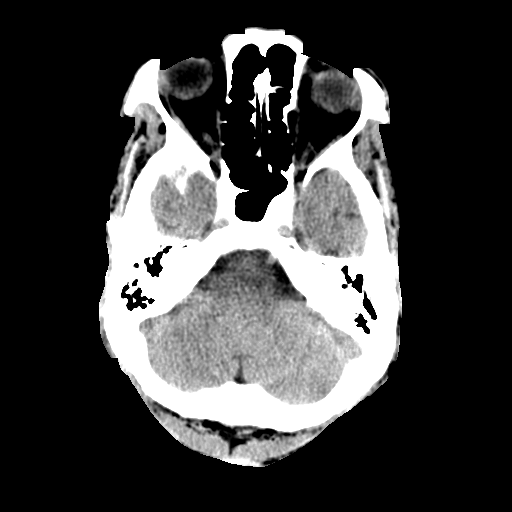
[im 9/31  brain]
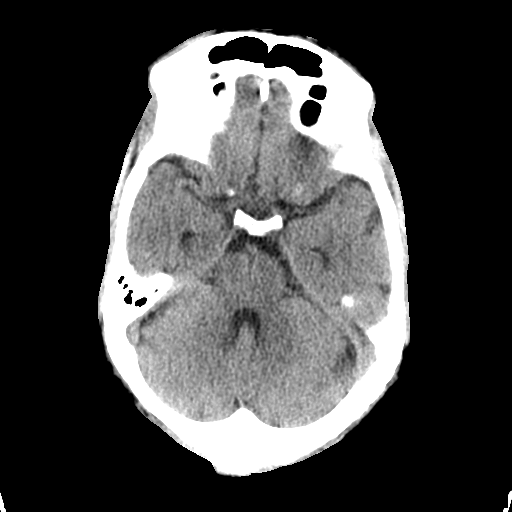
[im 12/31  brain]
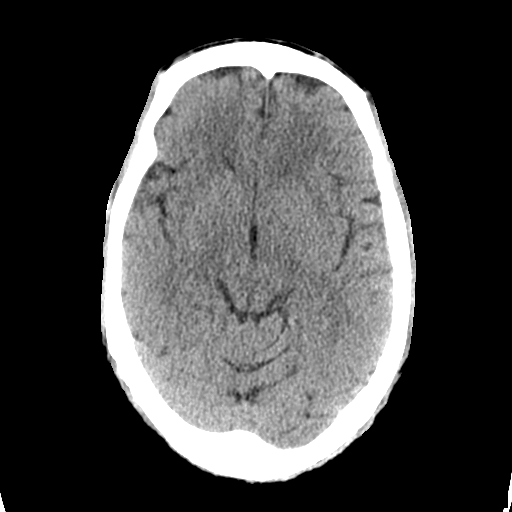
[im 16/31  brain]
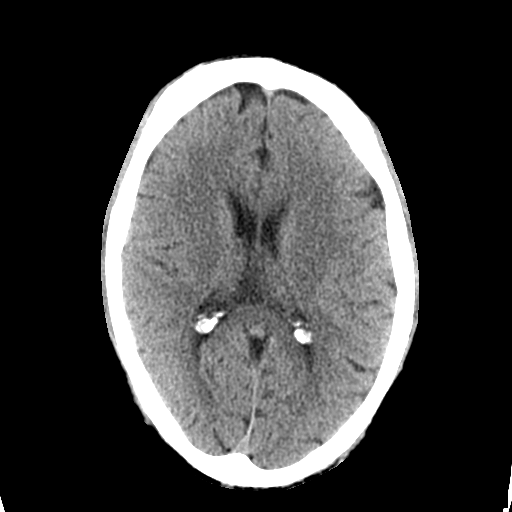
[im 16/31  bone]
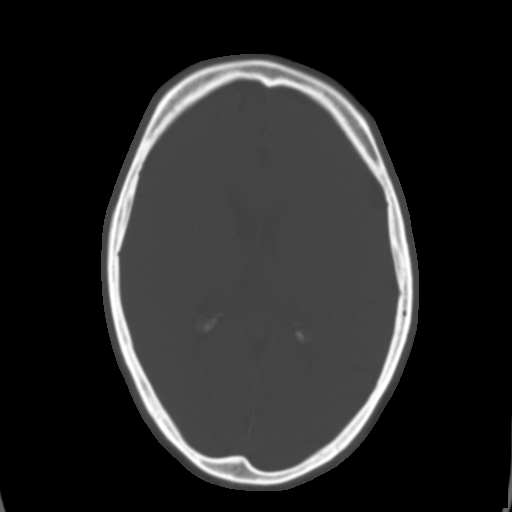
[im 19/31  brain]
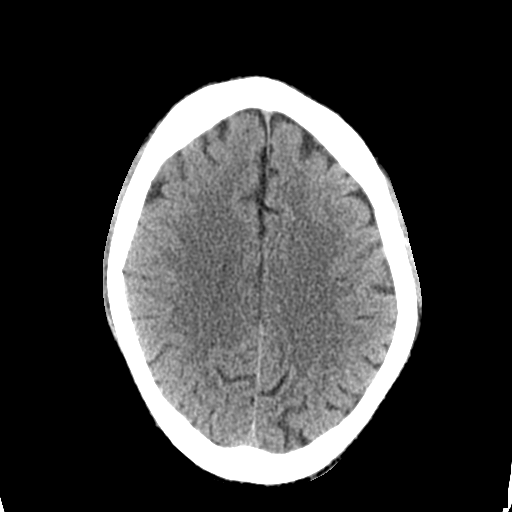
[im 22/31  brain]
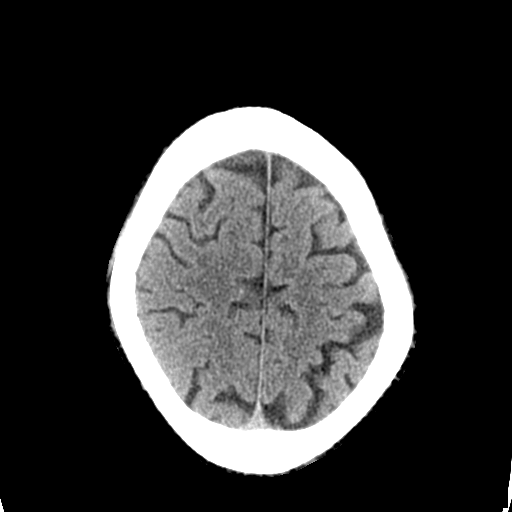
[im 25/31  brain]
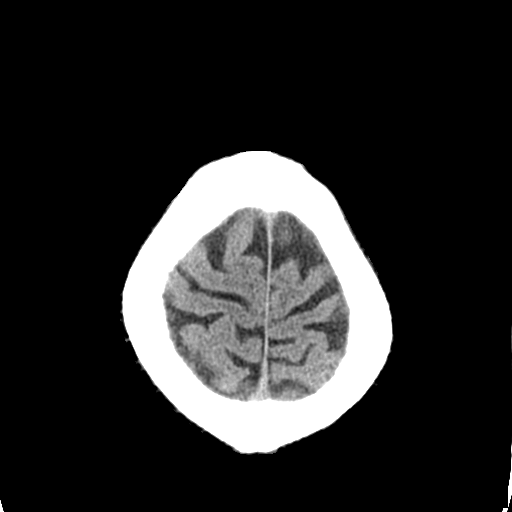
[im 28/31  brain]
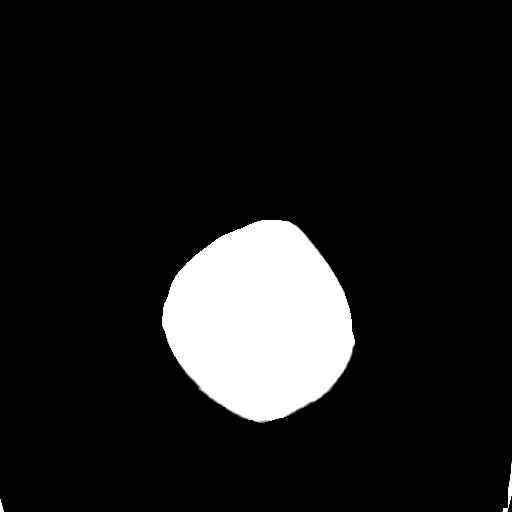
[im 28/31  bone]
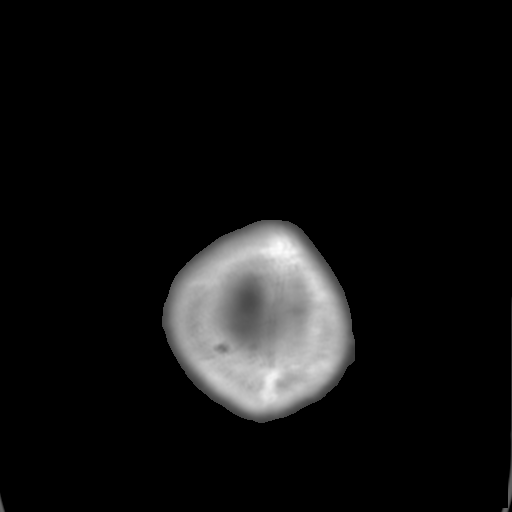

[Series 4: coronal soft tissue · coronal · 0.31mm/px · 3 of 68 slices shown]
[im 23/68  brain]
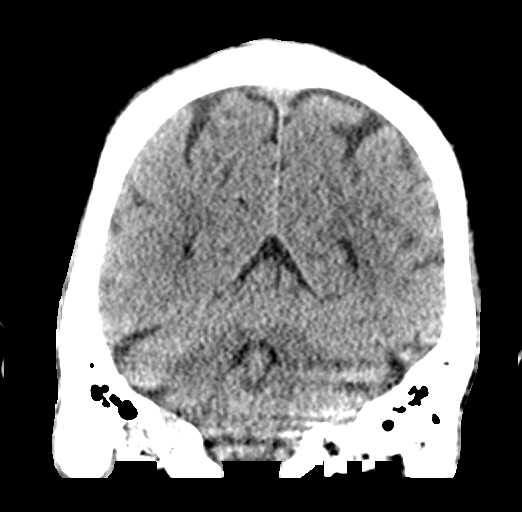
[im 30/68  brain]
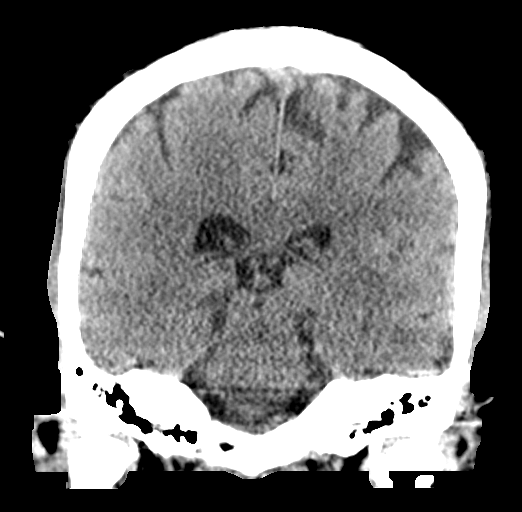
[im 38/68  brain]
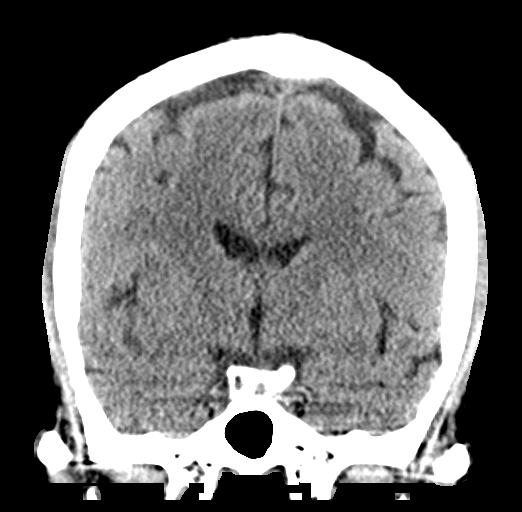

[Series 5: sagittal soft tissue · sagittal · 0.31mm/px · 3 of 54 slices shown]
[im 18/54  brain]
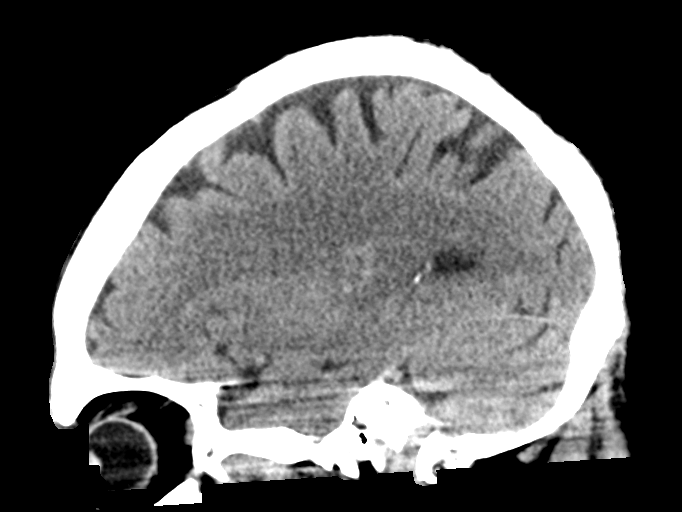
[im 27/54  brain]
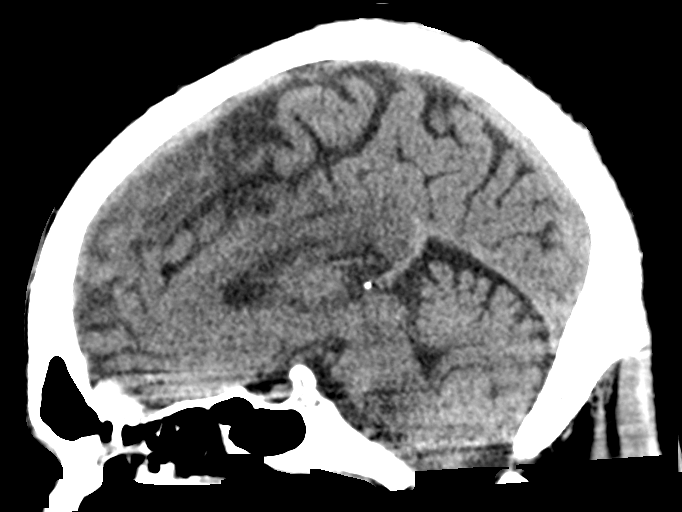
[im 36/54  brain]
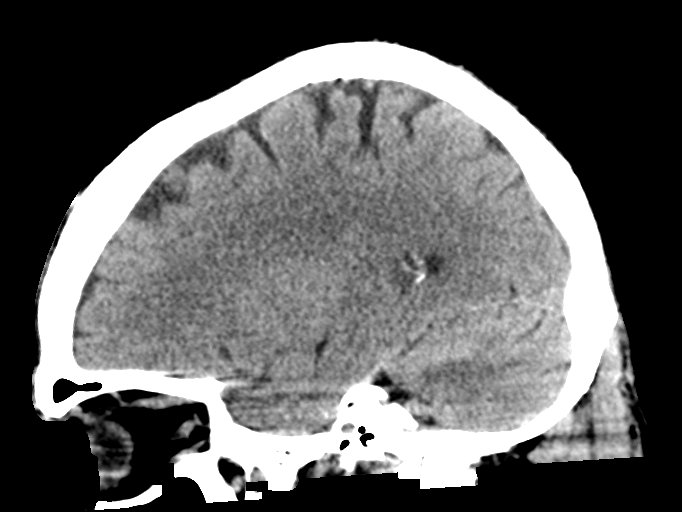

[15 of 47 positions shown; findings below may reference images not displayed]

FINDINGS: Brain:

Cerebral volume is unremarkable for age.

There is no acute intracranial hemorrhage.

No demarcated cortical infarct.

No extra-axial fluid collection.

No evidence of an intracranial mass.

No midline shift.

Vascular: No hyperdense vessel.

Skull: Normal. Negative for fracture or focal lesion.

Sinuses/Orbits: Visualized orbits show no acute finding. Chronic
medially displaced fracture deformity of the left lamina papyracea.
Mild mucosal thickening within the left greater than right ethmoid
air cells. Trace mucosal thickening and frothy secretions within the
bilateral sphenoid sinuses.
IMPRESSION: No evidence of acute intracranial abnormality.

Mild paranasal sinus disease at the imaged levels, as described.

## 2022-08-14 IMAGING — DX DG CHEST 1V PORT
1 series · 1 of 1 positions shown · non-contrast
Comparison: 03/14/2021 and older studies.

CLINICAL DATA: Weakness, SOB per ordering notes. Patient unable to
give adequate history at this time- per ED notes- Pt comes into the
ED via EMS called out for b/p of 60, states he is altered, pt is
from [REDACTED], states initially he did not want to come but is
not able to care for himself.

EXAM:
PORTABLE CHEST 1 VIEW

[chest ap]
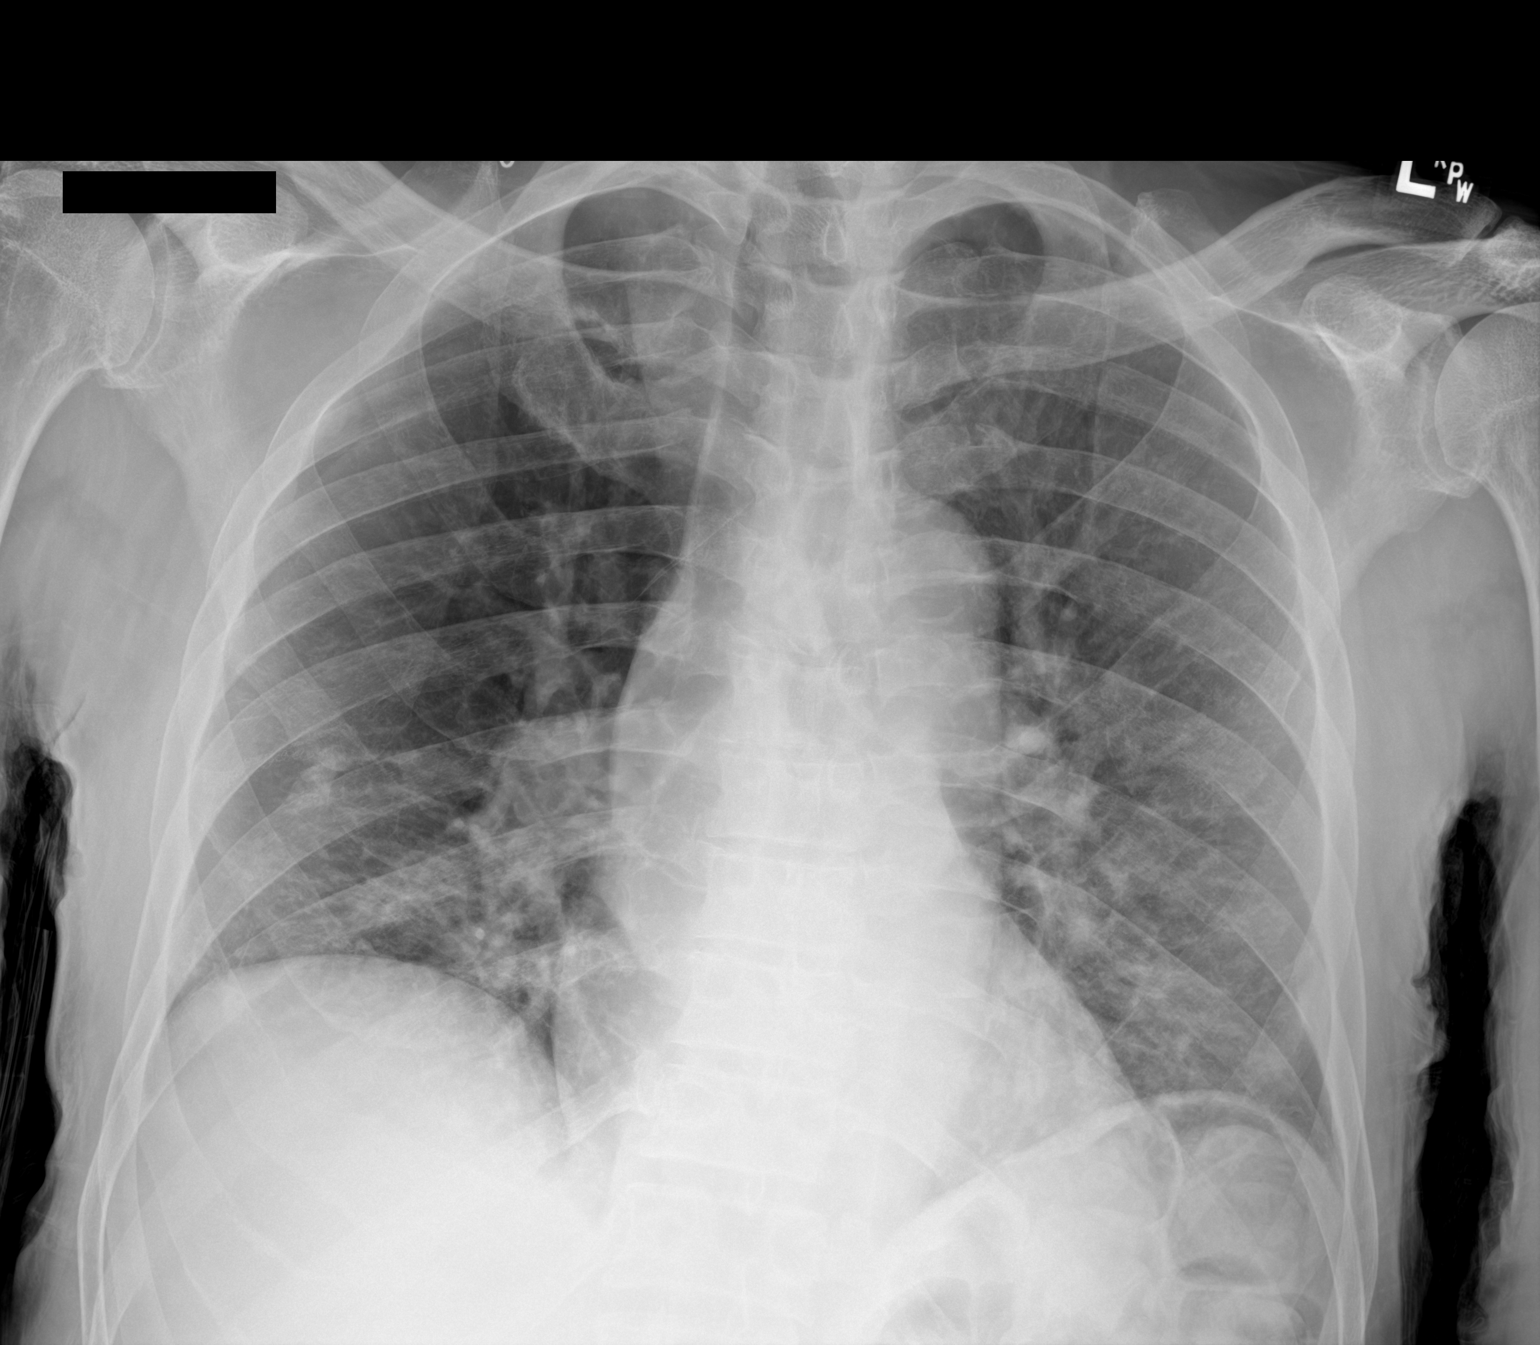

[1 of 1 positions shown; findings below may reference images not displayed]

FINDINGS: Cardiac silhouette is normal in size. No mediastinal or hilar
masses.

Linear lung base opacities are noted consistent with atelectasis,
scarring or a combination, without significant change from the prior
study allowing for differences in lung volume. Remainder of the
lungs is clear.

No convincing pleural effusion.  No pneumothorax.

Skeletal structures are grossly intact.
IMPRESSION: 1. No acute cardiopulmonary disease.

## 2023-04-14 ENCOUNTER — Ambulatory Visit: Payer: Medicare Other | Admitting: Podiatry

## 2023-04-17 ENCOUNTER — Encounter: Payer: Self-pay | Admitting: Podiatry

## 2023-04-17 ENCOUNTER — Ambulatory Visit (INDEPENDENT_AMBULATORY_CARE_PROVIDER_SITE_OTHER): Payer: Medicare Other | Admitting: Podiatry

## 2023-04-17 VITALS — BP 133/82 | HR 74

## 2023-04-17 DIAGNOSIS — L989 Disorder of the skin and subcutaneous tissue, unspecified: Secondary | ICD-10-CM | POA: Diagnosis not present

## 2023-04-17 DIAGNOSIS — M79675 Pain in left toe(s): Secondary | ICD-10-CM

## 2023-04-17 DIAGNOSIS — B351 Tinea unguium: Secondary | ICD-10-CM

## 2023-04-17 DIAGNOSIS — M79674 Pain in right toe(s): Secondary | ICD-10-CM

## 2023-04-17 NOTE — Progress Notes (Signed)
   Chief Complaint  Patient presents with   Nail Problem    "The toenails need to be clipped." N - toenails painful L - 1-5 bilateral D - last week O - gradually worse C - long, painful, thick, discolored A - shoes T - nail tried to cut them   Foot Pain    "My feet hurt under the bottom." N - pain  L - plantar  D - 1 yr O - suddenly C - tender A - walking on rocks T - none    SUBJECTIVE Patient presents to office today complaining of elongated, thickened nails that cause pain while ambulating in shoes.  Patient is unable to trim their own nails. Patient is here for further evaluation and treatment.  Past Medical History:  Diagnosis Date   Hepatitis    Hypertension     Allergies  Allergen Reactions   Penicillins Rash    Has patient had a PCN reaction causing immediate rash, facial/tongue/throat swelling, SOB or lightheadedness with hypotension: Unknown Has patient had a PCN reaction causing severe rash involving mucus membranes or skin necrosis: Unknown Has patient had a PCN reaction that required hospitalization: Unknown Has patient had a PCN reaction occurring within the last 10 years: Unknown If all of the above answers are "NO", then may proceed with Cephalosporin use.      OBJECTIVE General Patient is awake, alert, and oriented x 3 and in no acute distress. Derm Skin is dry and supple bilateral. Negative open lesions or macerations. Remaining integument unremarkable. Nails are tender, long, thickened and dystrophic with subungual debris, consistent with onychomycosis, 1-5 bilateral. No signs of infection noted.  Hyperkeratotic preulcerative callus lesions noted.  No open wounds Vasc  DP and PT pedal pulses palpable bilaterally. Temperature gradient within normal limits.  Neuro light touch and protective threshold sensation grossly intact bilaterally.  Musculoskeletal Exam No symptomatic pedal deformities noted bilateral. Muscular strength within normal  limits.  ASSESSMENT 1.  Pain due to onychomycosis of toenails both 2.  Symptomatic preulcerative callus lesions right foot  PLAN OF CARE -Patient evaluated today.  -Instructed to maintain good pedal hygiene and foot care.  -Mechanical debridement of nails 1-5 bilaterally performed using a nail nipper. Filed with dremel without incident.  -Excisional debridement of the hyperkeratotic callus tissue was performed today with a 3 Telle scalpel to proceed with patient -Return to clinic 3 months routine footcare   Felecia Shelling, DPM [facility]  Dr. Felecia Shelling, DPM    [address] [place], [place] [address]                Office [phone]  Fax [phone]

## 2023-07-16 ENCOUNTER — Ambulatory Visit: Payer: Medicare Other | Admitting: Podiatry

## 2023-07-31 ENCOUNTER — Encounter: Payer: Self-pay | Admitting: Podiatry

## 2023-07-31 ENCOUNTER — Ambulatory Visit (INDEPENDENT_AMBULATORY_CARE_PROVIDER_SITE_OTHER): Payer: Medicare Other | Admitting: Podiatry

## 2023-07-31 DIAGNOSIS — D2372 Other benign neoplasm of skin of left lower limb, including hip: Secondary | ICD-10-CM

## 2023-07-31 DIAGNOSIS — M79675 Pain in left toe(s): Secondary | ICD-10-CM | POA: Diagnosis not present

## 2023-07-31 DIAGNOSIS — M79674 Pain in right toe(s): Secondary | ICD-10-CM | POA: Diagnosis not present

## 2023-07-31 DIAGNOSIS — B351 Tinea unguium: Secondary | ICD-10-CM

## 2023-07-31 NOTE — Progress Notes (Signed)
   Chief Complaint  Patient presents with   Callouses    He has a callus that has been bothering him.    SUBJECTIVE Patient presents to office today complaining of elongated, thickened nails that cause pain while ambulating in shoes.  Patient is unable to trim their own nails.  Patient also has symptomatic calluses that are very tender and painful especially with walking patient is here for further evaluation and treatment.  Past Medical History:  Diagnosis Date   Hepatitis    Hypertension     Allergies  Allergen Reactions   Penicillins Rash    Has patient had a PCN reaction causing immediate rash, facial/tongue/throat swelling, SOB or lightheadedness with hypotension: Unknown Has patient had a PCN reaction causing severe rash involving mucus membranes or skin necrosis: Unknown Has patient had a PCN reaction that required hospitalization: Unknown Has patient had a PCN reaction occurring within the last 10 years: Unknown If all of the above answers are NO, then may proceed with Cephalosporin use.      OBJECTIVE General Patient is awake, alert, and oriented x 3 and in no acute distress. Derm Skin is dry and supple bilateral. Negative open lesions or macerations. Remaining integument unremarkable. Nails are tender, long, thickened and dystrophic with subungual debris, consistent with onychomycosis, 1-5 bilateral. No signs of infection noted.  Hyperkeratotic symptomatic skin lesions noted with a central nucleated core bilateral feet Vasc  DP and PT pedal pulses palpable bilaterally. Temperature gradient within normal limits.  Neuro Epicritic and protective threshold sensation grossly intact bilaterally.  Musculoskeletal Exam No symptomatic pedal deformities noted bilateral. Muscular strength within normal limits.  ASSESSMENT 1.  Pain due to onychomycosis of toenails both 2.  Eccrine poroma bilateral  PLAN OF CARE 1. Patient evaluated today.  2. Instructed to maintain good  pedal hygiene and foot care.  3. Mechanical debridement of nails 1-5 bilaterally performed using a nail nipper. Filed with dremel without incident.  4.  Excisional debridement of the hyperkeratotic callus tissue was performed today using a 312 scalpel without incident or bleeding  5.  Return to clinic in 3 mos.    Thresa EMERSON Sar, DPM [facility]  Dr. Thresa EMERSON Sar, DPM    [address] [place], [place] [address]                Office [phone]  Fax [phone]

## 2023-10-29 ENCOUNTER — Ambulatory Visit: Payer: Medicare Other | Admitting: Podiatry

## 2024-08-23 ENCOUNTER — Ambulatory Visit: Admitting: Podiatry

## 2024-08-30 ENCOUNTER — Ambulatory Visit (INDEPENDENT_AMBULATORY_CARE_PROVIDER_SITE_OTHER): Admitting: Podiatry

## 2024-08-30 ENCOUNTER — Encounter: Payer: Self-pay | Admitting: Podiatry

## 2024-08-30 VITALS — Ht 72.0 in | Wt 173.6 lb

## 2024-08-30 DIAGNOSIS — M79675 Pain in left toe(s): Secondary | ICD-10-CM | POA: Diagnosis not present

## 2024-08-30 DIAGNOSIS — B351 Tinea unguium: Secondary | ICD-10-CM | POA: Diagnosis not present

## 2024-08-30 DIAGNOSIS — M79674 Pain in right toe(s): Secondary | ICD-10-CM

## 2024-08-30 DIAGNOSIS — D2372 Other benign neoplasm of skin of left lower limb, including hip: Secondary | ICD-10-CM

## 2024-08-30 NOTE — Progress Notes (Signed)
" ° °  Chief Complaint  Patient presents with   Nail Problem    Pt is here for RFC and callus removal.    SUBJECTIVE Patient presents to office today complaining of elongated, thickened nails that cause pain while ambulating in shoes.  Patient is unable to trim their own nails.  Patient also has symptomatic calluses that are very tender and painful especially with walking patient is here for further evaluation and treatment.  Past Medical History:  Diagnosis Date   Hepatitis    Hypertension     Allergies  Allergen Reactions   Penicillins Rash    Has patient had a PCN reaction causing immediate rash, facial/tongue/throat swelling, SOB or lightheadedness with hypotension: Unknown Has patient had a PCN reaction causing severe rash involving mucus membranes or skin necrosis: Unknown Has patient had a PCN reaction that required hospitalization: Unknown Has patient had a PCN reaction occurring within the last 10 years: Unknown If all of the above answers are NO, then may proceed with Cephalosporin use.      OBJECTIVE General Patient is awake, alert, and oriented x 3 and in no acute distress. Derm Skin is dry and supple bilateral. Negative open lesions or macerations. Remaining integument unremarkable. Nails are tender, long, thickened and dystrophic with subungual debris, consistent with onychomycosis, 1-5 bilateral. No signs of infection noted.  Hyperkeratotic symptomatic skin lesions noted with a central nucleated core bilateral feet Vasc  DP and PT pedal pulses palpable bilaterally. Temperature gradient within normal limits.  Neuro Epicritic and protective threshold sensation grossly intact bilaterally.  Musculoskeletal Exam No symptomatic pedal deformities noted bilateral. Muscular strength within normal limits.  ASSESSMENT 1.  Pain due to onychomycosis of toenails both 2.  Eccrine poroma bilateral  PLAN OF CARE 1. Patient evaluated today.  2. Instructed to maintain good pedal  hygiene and foot care.  3. Mechanical debridement of nails 1-5 bilaterally performed using a nail nipper. Filed with dremel without incident.  4.  Excisional debridement of the hyperkeratotic callus tissue was performed today using a 312 scalpel without incident or bleeding  5.  Return to clinic in 3 mos.    Thresa EMERSON Sar, DPM [facility]  Dr. Thresa EMERSON Sar, DPM    [address] [place], [place] [address]                Office [phone]  Fax [phone]     "

## 2024-11-10 ENCOUNTER — Ambulatory Visit: Admitting: Podiatry
